# Patient Record
Sex: Male | Born: 1935 | Race: White | Hispanic: No | Smoking: Never smoker
Health system: Southern US, Community
[De-identification: ages and names within clinical notes are randomized; demographics above are authoritative.]

## PROBLEM LIST (undated history)

## (undated) DIAGNOSIS — I10 Essential (primary) hypertension: Secondary | ICD-10-CM

## (undated) DIAGNOSIS — H409 Unspecified glaucoma: Secondary | ICD-10-CM

## (undated) DIAGNOSIS — F419 Anxiety disorder, unspecified: Secondary | ICD-10-CM

## (undated) DIAGNOSIS — R32 Unspecified urinary incontinence: Secondary | ICD-10-CM

## (undated) HISTORY — PX: TONSILLECTOMY: SUR1361

## (undated) HISTORY — DX: Anxiety disorder, unspecified: F41.9

## (undated) HISTORY — DX: Unspecified glaucoma: H40.9

## (undated) HISTORY — DX: Unspecified urinary incontinence: R32

## (undated) HISTORY — PX: HERNIA REPAIR: SHX51

---

## 2011-04-23 ENCOUNTER — Emergency Department: Payer: Self-pay | Admitting: Emergency Medicine

## 2011-11-28 ENCOUNTER — Emergency Department: Payer: Self-pay | Admitting: Emergency Medicine

## 2014-01-17 DIAGNOSIS — K2100 Gastro-esophageal reflux disease with esophagitis, without bleeding: Secondary | ICD-10-CM | POA: Insufficient documentation

## 2014-01-17 DIAGNOSIS — N289 Disorder of kidney and ureter, unspecified: Secondary | ICD-10-CM | POA: Insufficient documentation

## 2014-01-17 DIAGNOSIS — I10 Essential (primary) hypertension: Secondary | ICD-10-CM | POA: Insufficient documentation

## 2014-01-17 DIAGNOSIS — Z8672 Personal history of thrombophlebitis: Secondary | ICD-10-CM | POA: Insufficient documentation

## 2014-01-17 DIAGNOSIS — I129 Hypertensive chronic kidney disease with stage 1 through stage 4 chronic kidney disease, or unspecified chronic kidney disease: Secondary | ICD-10-CM | POA: Insufficient documentation

## 2014-01-17 DIAGNOSIS — H409 Unspecified glaucoma: Secondary | ICD-10-CM | POA: Insufficient documentation

## 2014-01-17 DIAGNOSIS — N183 Chronic kidney disease, stage 3 unspecified: Secondary | ICD-10-CM | POA: Insufficient documentation

## 2017-06-29 ENCOUNTER — Ambulatory Visit
Admission: RE | Admit: 2017-06-29 | Discharge: 2017-06-29 | Disposition: A | Discharge status: Home / Self Care | Payer: Medicare Other | Source: Ambulatory Visit | Attending: Family | Admitting: Family

## 2017-06-29 ENCOUNTER — Other Ambulatory Visit: Payer: Self-pay | Admitting: Family

## 2017-06-29 DIAGNOSIS — R05 Cough: Secondary | ICD-10-CM

## 2017-06-29 DIAGNOSIS — K449 Diaphragmatic hernia without obstruction or gangrene: Secondary | ICD-10-CM | POA: Insufficient documentation

## 2017-06-29 DIAGNOSIS — R059 Cough, unspecified: Secondary | ICD-10-CM

## 2017-06-29 DIAGNOSIS — R5383 Other fatigue: Secondary | ICD-10-CM

## 2017-07-07 ENCOUNTER — Encounter: Payer: Self-pay | Admitting: Emergency Medicine

## 2017-07-07 ENCOUNTER — Emergency Department
Admission: EM | Admit: 2017-07-07 | Discharge: 2017-07-07 | Disposition: A | Discharge status: Home / Self Care | Payer: Medicare Other | Attending: Emergency Medicine | Admitting: Emergency Medicine

## 2017-07-07 ENCOUNTER — Emergency Department: Payer: Medicare Other

## 2017-07-07 ENCOUNTER — Other Ambulatory Visit: Payer: Self-pay

## 2017-07-07 DIAGNOSIS — I10 Essential (primary) hypertension: Secondary | ICD-10-CM | POA: Insufficient documentation

## 2017-07-07 DIAGNOSIS — R1030 Lower abdominal pain, unspecified: Secondary | ICD-10-CM | POA: Insufficient documentation

## 2017-07-07 DIAGNOSIS — R634 Abnormal weight loss: Secondary | ICD-10-CM | POA: Insufficient documentation

## 2017-07-07 DIAGNOSIS — R109 Unspecified abdominal pain: Secondary | ICD-10-CM

## 2017-07-07 HISTORY — DX: Essential (primary) hypertension: I10

## 2017-07-07 LAB — COMPREHENSIVE METABOLIC PANEL
ALK PHOS: 59 U/L (ref 38–126)
ALT: 14 U/L — AB (ref 17–63)
AST: 24 U/L (ref 15–41)
Albumin: 4.4 g/dL (ref 3.5–5.0)
Anion gap: 10 (ref 5–15)
BUN: 12 mg/dL (ref 6–20)
CALCIUM: 9.6 mg/dL (ref 8.9–10.3)
CO2: 28 mmol/L (ref 22–32)
CREATININE: 0.99 mg/dL (ref 0.61–1.24)
Chloride: 100 mmol/L — ABNORMAL LOW (ref 101–111)
Glucose, Bld: 107 mg/dL — ABNORMAL HIGH (ref 65–99)
Potassium: 3.7 mmol/L (ref 3.5–5.1)
Sodium: 138 mmol/L (ref 135–145)
Total Bilirubin: 0.7 mg/dL (ref 0.3–1.2)
Total Protein: 7.8 g/dL (ref 6.5–8.1)

## 2017-07-07 LAB — URINALYSIS, COMPLETE (UACMP) WITH MICROSCOPIC
BILIRUBIN URINE: NEGATIVE
Bacteria, UA: NONE SEEN
GLUCOSE, UA: NEGATIVE mg/dL
HGB URINE DIPSTICK: NEGATIVE
Ketones, ur: NEGATIVE mg/dL
LEUKOCYTES UA: NEGATIVE
Nitrite: NEGATIVE
Protein, ur: NEGATIVE mg/dL
Specific Gravity, Urine: 1.006 (ref 1.005–1.030)
pH: 6 (ref 5.0–8.0)

## 2017-07-07 LAB — CBC
HCT: 43.4 % (ref 40.0–52.0)
Hemoglobin: 14.7 g/dL (ref 13.0–18.0)
MCH: 28.6 pg (ref 26.0–34.0)
MCHC: 33.9 g/dL (ref 32.0–36.0)
MCV: 84.3 fL (ref 80.0–100.0)
PLATELETS: 116 10*3/uL — AB (ref 150–440)
RBC: 5.15 MIL/uL (ref 4.40–5.90)
RDW: 13.5 % (ref 11.5–14.5)
WBC: 7.6 10*3/uL (ref 3.8–10.6)

## 2017-07-07 LAB — LIPASE, BLOOD: Lipase: 30 U/L (ref 11–51)

## 2017-07-07 NOTE — ED Triage Notes (Signed)
Says has been having stomach pain for 6 months.  Unable to eat.  Is able to drink water okay.  Has been to pcp.  Is awaiting referral to gi doctor for "gastritis"

## 2017-07-07 NOTE — ED Notes (Signed)
Patient taken to imaging. 

## 2017-07-07 NOTE — Discharge Instructions (Signed)
please return here for worse pain fever vomiting. Please give Dr. [facility] office a call to set up a follow-up appointment.

## 2017-07-07 NOTE — ED Notes (Signed)
Patient declined discharge recheck of vital signs.

## 2017-07-07 NOTE — ED Provider Notes (Signed)
[facility] Department Provider Note   ____________________________________________   First MD Initiated Contact with Patient 07/07/17 1604     (approximate)  I have reviewed the triage vital signs and the nursing notes.   HISTORY  Chief Complaint Abdominal Pain  HPI Scott Ashley is a 84 y.o. male Who had diarrhea for month last month. This is now finished. Now he is complaining of a sense of fullness when he eats a little bit. He is not able to eat more or I should say as much as usual and is lost 10 pounds in the last month. He also has occasional mild lower abdominal pain mostly on the left side.he is no longer having diarrhea is not having any black stools or red blood in the stools is not vomiting.  Past Medical History:  Diagnosis Date  . Hypertension     There are no active problems to display for this patient.   Past Surgical History:  Procedure Laterality Date  . HERNIA REPAIR      Prior to Admission medications   Not on File    Allergies Codeine  History reviewed. No pertinent family history.  Social History Social History   Tobacco Use  . Smoking status: Never Smoker  . Smokeless tobacco: Never Used  Substance Use Topics  . Alcohol use: Never    Frequency: Never  . Drug use: Not on file    Review of Systems  Constitutional: No fever/chills Eyes: No visual changes. ENT: No sore throat. Cardiovascular: Denies chest pain. Respiratory: Denies shortness of breath. Gastrointestinal: see history of present illness Genitourinary: Negative for dysuria. Musculoskeletal: Negative for back pain. Skin: Negative for rash. Neurological: Negative for headaches, focal weakness  ____________________________________________   PHYSICAL EXAM:  VITAL SIGNS: ED Triage Vitals  Enc Vitals Group     BP 07/07/17 1317 (!) 149/102     Pulse Rate 07/07/17 1317 93     Resp 07/07/17 1317 18     Temp 07/07/17 1317 98.3 F  (36.8 C)     Temp Source 07/07/17 1317 Oral     SpO2 07/07/17 1317 95 %     Weight 07/07/17 1318 181 lb (82.1 kg)     Height 07/07/17 1318 6' (1.829 m)     Head Circumference --      Peak Flow --      Pain Score 07/07/17 1317 3     Pain Loc --      Pain Edu? --      Excl. in [place]? --     Constitutional: Alert and oriented. Well appearing and in no acute distress. Eyes: Conjunctivae are normal.  Head: Atraumatic. Nose: No congestion/rhinnorhea. Mouth/Throat: Mucous membranes are moist.  Oropharynx non-erythematous. Neck: No stridor.   Cardiovascular: Normal rate, regular rhythm. Grossly normal heart sounds.  Good peripheral circulation. Respiratory: Normal respiratory effort.  No retractions. Lungs CTAB. Gastrointestinal: Soft and nontender. No distention. No abdominal bruits. No CVA tenderness. }Musculoskeletal: No lower extremity tenderness nor edema.   Neurologic:  Normal speech and language. No gross focal neurologic deficits are appreciated. No gait instability. Skin:  Skin is warm, dry and intact. No rash noted. Psychiatric: Mood and affect are normal. Speech and behavior are normal.  ____________________________________________   LABS (all labs ordered are listed, but only abnormal results are displayed)  Labs Reviewed  COMPREHENSIVE METABOLIC PANEL - Abnormal; Notable for the following components:      Result Value   Chloride 100 (*)  Glucose, Bld 107 (*)    ALT 14 (*)    All other components within normal limits  CBC - Abnormal; Notable for the following components:   Platelets 116 (*)    All other components within normal limits  URINALYSIS, COMPLETE (UACMP) WITH MICROSCOPIC - Abnormal; Notable for the following components:   Color, Urine YELLOW (*)    APPearance CLEAR (*)    All other components within normal limits  LIPASE, BLOOD   ____________________________________________  EKG  EKG read and interpreted by me shows A. fib at a rate of 89 0 axis no  acute ST-T wave changes ____________________________________________  RADIOLOGY  ED MD interpretation:   Official radiology report(s): Dg Abdomen Acute W/chest  Result Date: 07/07/2017 CLINICAL DATA:  Abdominal pain for the past 6 months. Previous hernia repair. EXAM: DG ABDOMEN ACUTE W/ 1V CHEST COMPARISON:  Chest dated 06/29/2017. FINDINGS: Normal sized heart. Tortuous aorta. Moderately large hiatal hernia. Stable prominent left epicardial fat pad and right nipple shadow. Clear lungs. The previously seen small right upper lobe nodular density is no longer demonstrated. Normal bowel gas pattern without free peritoneal air. Diffuse osteopenia. Thoracic and lumbar spine degenerative changes and mild scoliosis. Old, healed proximal left humerus fracture. IMPRESSION: 1. No acute abnormality. 2. Stable moderate-sized hiatal hernia. 3. The previously seen small right upper lobe nodular density there is no longer visualized. Electronically Signed   By: Claudie Revering M.D.   On: 07/07/2017 16:44    ____________________________________________   PROCEDURES  Procedure(s) performed:   Procedures  Critical Care performed:   ____________________________________________   INITIAL IMPRESSION / ASSESSMENT AND PLAN / ED COURSE  patient's labs and x-rays are within normal limits. Patientohl which I have provided. Patient is to return here if further problems.         ____________________________________________   FINAL CLINICAL IMPRESSION(S) / ED DIAGNOSES  Final diagnoses:  Abdominal pain, unspecified abdominal location     ED Discharge Orders    None       Note:  This document was prepared using Dragon voice recognition software and may include unintentional dictation errors.    Nena Polio, MD 07/07/17 [phone]

## 2017-07-12 ENCOUNTER — Ambulatory Visit: Payer: Medicare Other | Admitting: Gastroenterology

## 2017-07-12 ENCOUNTER — Encounter: Payer: Self-pay | Admitting: Gastroenterology

## 2017-07-12 VITALS — BP 148/73 | HR 103 | Temp 98.2°F | Ht 72.0 in | Wt 177.8 lb

## 2017-07-12 DIAGNOSIS — R1030 Lower abdominal pain, unspecified: Secondary | ICD-10-CM

## 2017-07-12 DIAGNOSIS — R197 Diarrhea, unspecified: Secondary | ICD-10-CM

## 2017-07-12 NOTE — Progress Notes (Signed)
Scott Ashley [address]  [place]  [place], [place] [address]  Main: [phone]  Fax: [phone]   Gastroenterology Consultation  Referring Provider:     Laneta Simmers, NP Primary Care Physician:  Scott Simmers, NP Primary Gastroenterologist:  Dr. Vonda Ashley Reason for Consultation:     Abdominal pain, diarrhea        HPI:    Chief Complaint  Patient presents with  . Establish Care    Scott Ashley is a 84 y.o. y/o male referred for consultation & management  by Dr. Laneta Simmers, NP.  Patient here with his granddaughter today.  They report the patient has been having 27-month history of abdominal pain and diarrhea.  He reports abdominal pain to be diffuse, dull, constant, 3/10, with no radiation, no nausea or vomiting.  Eating makes the pain immediately worse, and he reports some loss of appetite and subjective weight loss due to this.  No dysphagia, no heartburn.  He has started using Zantac twice a day which has helped his pain somewhat but is still interfering with his eating.  Reports 1 levels bowel movement daily.  No blood in stool.  No previous history of similar symptoms.   Went to the ER on May 31 for the pain, and abdominal x-ray showed a hiatal hernia, lipase were normal at the time.  White count was normal as well.  Patient denies any fever or chills.  They also report taking antibiotics recently for sinus infection, states last antibiotic use was 3 to 4 days ago.  However, states symptoms were present prior to the antibiotics.  Colonoscopy report available in provation from 2003.  This reported, 5 mm cecal polyp that was removed via hot forceps.  Diverticulosis.  Pathology report not available.  EGD at the time reported gastric antral erythema, hiatal hernia.  Biopsies of the stomach were done but pathology report not available.  Past Medical History:  Diagnosis Date  . Hypertension     Past Surgical History:  Procedure  Laterality Date  . HERNIA REPAIR      Prior to Admission medications   Not on File    No family history on file.   Social History   Tobacco Use  . Smoking status: Never Smoker  . Smokeless tobacco: Never Used  Substance Use Topics  . Alcohol use: Never    Frequency: Never  . Drug use: Not on file    Allergies as of 07/12/2017 - Review Complete 07/07/2017  Allergen Reaction Noted  . Codeine Other (See Comments) 06/10/2016    Review of Systems:    All systems reviewed and negative except where noted in HPI.   Physical Exam:  Vitals reviewed No LMP for male patient. Psych:  Alert and cooperative. Normal mood and affect. General:   Alert,  Well-developed, well-nourished, pleasant and cooperative in NAD Head:  Normocephalic and atraumatic. Eyes:  Sclera clear, no icterus.   Conjunctiva pink. Ears:  Normal auditory acuity. Nose:  No deformity, discharge, or lesions. Mouth:  No deformity or lesions,oropharynx pink & moist. Neck:  Supple; no masses or thyromegaly. Lungs:  Respirations even and unlabored.  Clear throughout to auscultation.   No wheezes, crackles, or rhonchi. No acute distress. Heart:  Regular rate and rhythm; no murmurs, clicks, rubs, or gallops. Abdomen:  Normal bowel sounds.  No bruits.  Soft, non-tender and non-distended without masses, hepatosplenomegaly or hernias noted.  No guarding or rebound tenderness.    Msk:  Symmetrical without  gross deformities. Good, equal movement & strength bilaterally. Pulses:  Normal pulses noted. Extremities:  No clubbing or edema.  No cyanosis. Neurologic:  Alert and oriented x3;  grossly normal neurologically. Skin:  Intact without significant lesions or rashes. No jaundice. Lymph Nodes:  No significant cervical adenopathy. Psych:  Alert and cooperative. Normal mood and affect.   Labs: CBC    Component Value Date/Time   WBC 7.6 07/07/2017 1330   RBC 5.15 07/07/2017 1330   HGB 14.7 07/07/2017 1330   HCT 43.4  07/07/2017 1330   PLT 116 (L) 07/07/2017 1330   MCV 84.3 07/07/2017 1330   MCH 28.6 07/07/2017 1330   MCHC 33.9 07/07/2017 1330   RDW 13.5 07/07/2017 1330   CMP     Component Value Date/Time   NA 138 07/07/2017 1330   K 3.7 07/07/2017 1330   CL 100 (L) 07/07/2017 1330   CO2 28 07/07/2017 1330   GLUCOSE 107 (H) 07/07/2017 1330   BUN 12 07/07/2017 1330   CREATININE 0.99 07/07/2017 1330   CALCIUM 9.6 07/07/2017 1330   PROT 7.8 07/07/2017 1330   ALBUMIN 4.4 07/07/2017 1330   AST 24 07/07/2017 1330   ALT 14 (L) 07/07/2017 1330   ALKPHOS 59 07/07/2017 1330   BILITOT 0.7 07/07/2017 1330   GFRNONAA >60 07/07/2017 1330   GFRAA >60 07/07/2017 1330    Imaging Studies: Dg Chest 2 View  Result Date: 06/29/2017 CLINICAL DATA:  Cough, fatigue for a month, on medication for hypertension EXAM: CHEST - 2 VIEW COMPARISON:  None. FINDINGS: No active infiltrate or effusion is seen. A small nodular opacity in the right upper lung field may represent a faintly calcified granuloma but comparison with prior or follow-up chest x-ray is recommended. A small nodule at the right base most likely represents nipple shadow. Mediastinal and hilar contours are unremarkable. The heart is mildly enlarged and a small to moderate size hiatal hernia is present. No bony abnormality is seen. IMPRESSION: 1. No pneumonia.  No pleural effusion. 2. Small to moderate size hiatal hernia. 3. Question granuloma in the right upper lobe. Recommend attention to this area on follow-up chest x-ray. . Electronically Signed   By: Ivar Drape M.D.   On: 06/29/2017 11:57   Dg Abdomen Acute W/chest  Result Date: 07/07/2017 CLINICAL DATA:  Abdominal pain for the past 6 months. Previous hernia repair. EXAM: DG ABDOMEN ACUTE W/ 1V CHEST COMPARISON:  Chest dated 06/29/2017. FINDINGS: Normal sized heart. Tortuous aorta. Moderately large hiatal hernia. Stable prominent left epicardial fat pad and right nipple shadow. Clear lungs. The previously  seen small right upper lobe nodular density is no longer demonstrated. Normal bowel gas pattern without free peritoneal air. Diffuse osteopenia. Thoracic and lumbar spine degenerative changes and mild scoliosis. Old, healed proximal left humerus fracture. IMPRESSION: 1. No acute abnormality. 2. Stable moderate-sized hiatal hernia. 3. The previously seen small right upper lobe nodular density there is no longer visualized. Electronically Signed   By: Claudie Revering M.D.   On: 07/07/2017 16:44    Assessment and Plan:   WYLEE DORANTES is a 84 y.o. y/o male has been referred for abdominal pain, diarrhea, recent antibiotics use, with normal white count and lipase on recent ER visit  We will check stool for C. difficile, GI panel to rule out infectious causes given diarrhea and abdominal pain and recent antibiotics and use  Also check stool for H. Pylori  If This is negative, patient will need CT abdomen given  that his pain is interfering with his appetite.   If above stool testing is negative, will also change Zantac to PPI to see if it helps symptoms  No acute abdomen on examination today.  No alarm symptoms present at this time  Patient asked to hydrate well, and maintain good nutrition.  I have asked him to try a full liquid diet, to see if they are able to tolerate that better.  We will follow in clinic closely.  I have discussed alarm symptoms with patient and granddaughter extensively (including but not limited to blood in stool, worsening abdominal pain, fever or chills, altered bowel habits, melena, dysphagia, nausea or vomiting, or any other reason for concern).  If these occur they are to call us immediately or go to the ER.  They verbalized understanding of this.   Dr Scott Ashley

## 2017-07-12 NOTE — Patient Instructions (Signed)
F/U 1 month Full Liquid Diet A full liquid diet may be used:  To help you transition from a clear liquid diet to a soft diet.  When your body is healing and can only tolerate foods that are easy to digest.  Before or after certain a procedure, test, or surgery (such as stomach or intestinal surgeries).  If you have trouble swallowing or chewing.  A full liquid diet includes fluids and foods that are liquid or will become liquid at room temperature. The full liquid diet gives you the proteins, fluids, salts, and minerals that you need for energy. If you continue this diet for more than 72 hours, talk to your health care provider about how many calories you need to consume. If you continue the diet for more than 5 days, talk to your health care provider about taking a multivitamin or a nutritional supplement. What do I need to know about a full liquid diet?  You may have any liquid.  You may have any food that becomes a liquid at room temperature. The food is considered a liquid if it can be poured off a spoon at room temperature.  Drink one serving of citrus or vitamin C-enriched fruit juice daily. What foods can I eat? Grains Any grain food that can be pureed in soup (such as crackers, pasta, and rice). Hot cereal (such as farina or oatmeal) that has been blended. Talk to your health care provider or dietitian about these foods. Vegetables Pulp-free tomato or vegetable juice. Vegetables pureed in soup. Fruits Fruit juice, including nectars and juices with pulp. Meats and Other Protein Sources Eggs in custard, eggnog mix, and eggs used in ice cream or pudding. Strained meats, like in baby food, may be allowed. Consult your health care provider. Dairy Milk and milk-based beverages, including milk shakes and instant breakfast mixes. Smooth yogurt. Pureed cottage cheese. Avoid these foods if they are not well tolerated. Beverages All beverages, including liquid nutritional supplements.  Ask your health care provider if you can have carbonated beverages. They may not be well tolerated. Condiments Iodized salt, pepper, spices, and flavorings. Cocoa powder. Vinegar, ketchup, yellow mustard, smooth sauces (such as hollandaise, cheese sauce, or white sauce), and soy sauce. Sweets and Desserts Custard, smooth pudding. Flavored gelatin. Tapioca, junket. Plain ice cream, sherbet, fruit ices. Frozen ice pops, frozen fudge pops, pudding pops, and other frozen bars with cream. Syrups, including chocolate syrup. Sugar, honey, jelly. Fats and Oils Margarine, butter, cream, sour cream, and oils. Other Broth and cream soups. Strained, broth-based soups. The items listed above may not be a complete list of recommended foods or beverages. Contact your dietitian for more options. What foods can I not eat? Grains All breads. Grains are not allowed unless they are pureed into soup. Vegetables Vegetables are not allowed unless they are juiced, or cooked and pureed into soup. Fruits Fruits are not allowed unless they are juiced. Meats and Other Protein Sources Any meat or fish. Cooked or raw eggs. Nut butters. Dairy Cheese. Condiments Stone ground mustards. Fats and Oils Fats that are coarse or chunky. Sweets and Desserts Ice cream or other frozen desserts that have any solids in them or on top, such as nuts, chocolate chips, and pieces of cookies. Cakes. Cookies. Candy. Others Soups with chunks or pieces in them. The items listed above may not be a complete list of foods and beverages to avoid. Contact your dietitian for more information. This information is not intended to replace advice given to  you by your health care provider. Make sure you discuss any questions you have with your health care provider. Document Released: 01/24/2005 Document Revised: 07/02/2015 Document Reviewed: 11/29/2012 Elsevier Interactive Patient Education  2017 [employer]  Diarrhea, Adult Diarrhea is  when you have loose and water poop (stool) often. Diarrhea can make you feel weak and cause you to get dehydrated. Dehydration can make you tired and thirsty, make you have a dry mouth, and make it so you pee (urinate) less often. Diarrhea often lasts 2-3 days. However, it can last longer if it is a sign of something more serious. It is important to treat your diarrhea as told by your doctor. Follow these instructions at home: Eating and drinking  Follow these recommendations as told by your doctor:  Take an oral rehydration solution (ORS). This is a drink that is sold at pharmacies and stores.  Drink clear fluids, such as: ? Water. ? Ice chips. ? Diluted fruit juice. ? Low-calorie sports drinks.  Eat bland, easy-to-digest foods in small amounts as you are able. These foods include: ? Bananas. ? Applesauce. ? Rice. ? Low-fat (lean) meats. ? Toast. ? Crackers.  Avoid drinking fluids that have a lot of sugar or caffeine in them.  Avoid alcohol.  Avoid spicy or fatty foods.  General instructions   Drink enough fluid to keep your pee (urine) clear or pale yellow.  Wash your hands often. If you cannot use soap and water, use hand sanitizer.  Make sure that all people in your home wash their hands well and often.  Take over-the-counter and prescription medicines only as told by your doctor.  Rest at home while you get better.  Watch your condition for any changes.  Take a warm bath to help with any burning or pain from having diarrhea.  Keep all follow-up visits as told by your doctor. This is important. Contact a doctor if:  You have a fever.  Your diarrhea gets worse.  You have new symptoms.  You cannot keep fluids down.  You feel light-headed or dizzy.  You have a headache.  You have muscle cramps. Get help right away if:  You have chest pain.  You feel very weak or you pass out (faint).  You have bloody or black poop or poop that look like tar.  You  have very bad pain, cramping, or bloating in your belly (abdomen).  You have trouble breathing or you are breathing very quickly.  Your heart is beating very quickly.  Your skin feels cold and clammy.  You feel confused.  You have signs of dehydration, such as: ? Dark pee, hardly any pee, or no pee. ? Cracked lips. ? Dry mouth. ? Sunken eyes. ? Sleepiness. ? Weakness. This information is not intended to replace advice given to you by your health care provider. Make sure you discuss any questions you have with your health care provider. Document Released: 07/13/2007 Document Revised: 08/14/2015 Document Reviewed: 09/30/2014 Elsevier Interactive Patient Education  2018 [employer].

## 2017-07-15 LAB — H. PYLORI ANTIGEN, STOOL: H pylori Ag, Stl: POSITIVE — AB

## 2017-07-15 LAB — GI PROFILE, STOOL, PCR
ASTROVIRUS: NOT DETECTED
Adenovirus F 40/41: NOT DETECTED
C difficile toxin A/B: NOT DETECTED
CAMPYLOBACTER: NOT DETECTED
CRYPTOSPORIDIUM: NOT DETECTED
CYCLOSPORA CAYETANENSIS: NOT DETECTED
ENTAMOEBA HISTOLYTICA: NOT DETECTED
ENTEROAGGREGATIVE E COLI: NOT DETECTED
ENTEROPATHOGENIC E COLI: NOT DETECTED
Enterotoxigenic E coli: NOT DETECTED
Giardia lamblia: NOT DETECTED
NOROVIRUS GI/GII: NOT DETECTED
Plesiomonas shigelloides: NOT DETECTED
Rotavirus A: NOT DETECTED
SHIGELLA/ENTEROINVASIVE E COLI: NOT DETECTED
Salmonella: NOT DETECTED
Sapovirus: NOT DETECTED
Shiga-toxin-producing E coli: NOT DETECTED
VIBRIO: NOT DETECTED
Vibrio cholerae: NOT DETECTED
YERSINIA ENTEROCOLITICA: NOT DETECTED

## 2017-07-18 ENCOUNTER — Other Ambulatory Visit: Payer: Self-pay

## 2017-07-18 NOTE — Addendum Note (Signed)
Addended by: [name] on: 07/18/2017 01:04 PM   Modules accepted: Orders

## 2017-07-19 ENCOUNTER — Other Ambulatory Visit: Payer: Self-pay | Admitting: Gastroenterology

## 2017-07-19 ENCOUNTER — Telehealth: Payer: Self-pay | Admitting: Gastroenterology

## 2017-07-19 MED ORDER — AMOXICILLIN 500 MG PO TABS
1000.0000 mg | ORAL_TABLET | Freq: Two times a day (BID) | ORAL | 0 refills | Status: AC
Start: 2017-07-19 — End: 2017-08-02

## 2017-07-19 MED ORDER — OMEPRAZOLE 20 MG PO CPDR: 20.0000 mg | DELAYED_RELEASE_CAPSULE | Freq: Two times a day (BID) | ORAL | 0 refills | Status: DC

## 2017-07-19 MED ORDER — CLARITHROMYCIN 250 MG PO TABS: 500.0000 mg | ORAL_TABLET | Freq: Two times a day (BID) | ORAL | 0 refills | Status: AC

## 2017-07-19 NOTE — Addendum Note (Signed)
Addended by: [name] on: 07/19/2017 02:17 PM   Modules accepted: Orders

## 2017-07-19 NOTE — Telephone Encounter (Signed)
-----   Message from Virgel Manifold, MD sent at 07/18/2017  1:05 PM EDT ----- Jackelyn Poling please let patient know, his stool testing was positive for Helicobacter pylori infection.  This can explain his abdominal pain.  I have prescribed 2 antibiotics, and omeprazole for 14 days.  I have pended these orders.  Pharmacy is not listed on his chart, please ask him what pharmacy to send them to and release them.  If there is an interaction with any of his medications when you are ordering them, please let me know.

## 2017-07-19 NOTE — Telephone Encounter (Signed)
Patients granddaughter called and patient is ready to take his medication for H pylori but waiting for it to be called in. Please call

## 2017-07-19 NOTE — Telephone Encounter (Signed)
Dr. Bonna Gains sent in medication earlier and pt states he has got them.

## 2017-08-08 ENCOUNTER — Encounter: Payer: Self-pay | Admitting: Gastroenterology

## 2017-08-08 ENCOUNTER — Ambulatory Visit: Payer: Medicare Other | Admitting: Gastroenterology

## 2017-08-08 ENCOUNTER — Encounter (INDEPENDENT_AMBULATORY_CARE_PROVIDER_SITE_OTHER): Payer: Self-pay

## 2017-08-08 VITALS — BP 143/87 | HR 86 | Ht 72.0 in | Wt 178.4 lb

## 2017-08-08 DIAGNOSIS — R1030 Lower abdominal pain, unspecified: Secondary | ICD-10-CM

## 2017-08-08 DIAGNOSIS — K2970 Gastritis, unspecified, without bleeding: Secondary | ICD-10-CM

## 2017-08-08 DIAGNOSIS — K2990 Gastroduodenitis, unspecified, without bleeding: Secondary | ICD-10-CM | POA: Diagnosis not present

## 2017-08-08 MED ORDER — SIMETHICONE 125 MG PO CHEW: 125.0000 mg | CHEWABLE_TABLET | Freq: Four times a day (QID) | ORAL | 0 refills | Status: DC | PRN

## 2017-08-08 NOTE — Patient Instructions (Signed)
F/U 3 months Stop omeprazole. H.Pylori-do lab 3 weeks from this Wednesday. (after 7/17)

## 2017-08-08 NOTE — Progress Notes (Signed)
[name], MD [address]  [place]  [place], [place] [address]  Main: [phone]  Fax: [phone]   Primary Care Physician: Laneta Simmers, NP  Primary Gastroenterologist:  Dr. [name]  Chief Complaint  Patient presents with  . Follow-up    HPI: Scott Ashley is a 84 y.o. male here for follow-up of abdominal pain.  Patient was recently seen in clinic, and complained of abdominal pain.  Stool for H. pylori was positive, and he is status post triple therapy.  Since triple therapy, his abdominal pain is completely resolved.  He reports good appetite now, no nausea, vomiting, abdominal pain, altered bowel habits, blood in stool.  Reports intermittent gas at times.  No heartburn.  Previous history: Colonoscopy report available in provation from 2003.  This reported, 5 mm cecal polyp that was removed via hot forceps.  Diverticulosis.  Pathology report not available.  EGD at the time reported gastric antral erythema, hiatal hernia.  Biopsies of the stomach were done but pathology report not available.   Current Outpatient Medications  Medication Sig Dispense Refill  . bisoprolol-hydrochlorothiazide (ZIAC) 5-6.25 MG tablet TK 1 T PO QD FOR BLOOD PRESSURE  5  . cetirizine (ZYRTEC) 5 MG tablet TK 1 T PO  QD  1  . fluticasone (FLONASE) 50 MCG/ACT nasal spray INSTILL 1 SPRAY IEN QD FOR ALLERGIES  2  . latanoprost (XALATAN) 0.005 % ophthalmic solution INT 1 GTT IN OU QHS  4  . omeprazole (PRILOSEC) 20 MG capsule TAKE 1 CAPSULE BY MOUTH TWICE DAILY BEFORE A MEAL FOR 14 DAYS 504 capsule 0  . PHENObarbital (LUMINAL) 30 MG tablet TK 1 T PO ONCE A DAY PRA  1  . tamsulosin (FLOMAX) 0.4 MG CAPS capsule TK 1 C PO QD 30 MINUTES AFTER THE SAME MEAL  5   No current facility-administered medications for this visit.     Allergies as of 08/08/2017 - Review Complete 08/08/2017  Allergen Reaction Noted  . Codeine Other (See Comments) 06/10/2016    ROS:  General:  Negative for anorexia, weight loss, fever, chills, fatigue, weakness. ENT: Negative for hoarseness, difficulty swallowing , nasal congestion. CV: Negative for chest pain, angina, palpitations, dyspnea on exertion, peripheral edema.  Respiratory: Negative for dyspnea at rest, dyspnea on exertion, cough, sputum, wheezing.  GI: See history of present illness. GU:  Negative for dysuria, hematuria, urinary incontinence, urinary frequency, nocturnal urination.  Endo: Negative for unusual weight change.    Physical Examination:   BP (!) 143/87   Pulse 86   Ht 6' (1.829 m)   Wt 178 lb 6.4 oz (80.9 kg)   BMI 24.20 kg/m    General: Well-nourished, well-developed in no acute distress.  Eyes: No icterus. Conjunctivae pink. Mouth: Oropharyngeal mucosa moist and pink , no lesions erythema or exudate. Neck: Supple, Trachea midline Abdomen: Bowel sounds are normal, nontender, nondistended, no hepatosplenomegaly or masses, no abdominal bruits or hernia , no rebound or guarding.   Extremities: No lower extremity edema. No clubbing or deformities. Neuro: Alert and oriented x 3.  Grossly intact. Skin: Warm and dry, no jaundice.   Psych: Alert and cooperative, normal mood and affect.   Labs: CMP     Component Value Date/Time   NA 138 07/07/2017 1330   K 3.7 07/07/2017 1330   CL 100 (L) 07/07/2017 1330   CO2 28 07/07/2017 1330   GLUCOSE 107 (H) 07/07/2017 1330   BUN 12 07/07/2017 1330   CREATININE 0.99 07/07/2017  1330   CALCIUM 9.6 07/07/2017 1330   PROT 7.8 07/07/2017 1330   ALBUMIN 4.4 07/07/2017 1330   AST 24 07/07/2017 1330   ALT 14 (L) 07/07/2017 1330   ALKPHOS 59 07/07/2017 1330   BILITOT 0.7 07/07/2017 1330   GFRNONAA >60 07/07/2017 1330   GFRAA >60 07/07/2017 1330   Lab Results  Component Value Date   WBC 7.6 07/07/2017   HGB 14.7 07/07/2017   HCT 43.4 07/07/2017   MCV 84.3 07/07/2017   PLT 116 (L) 07/07/2017    Imaging Studies: No results found.  Assessment and Plan:     Scott Ashley is a 84 y.o. y/o male here for follow-up of vomiting, which is completely resolved after triple therapy for positive stool H. pylori  Patient completed triple therapy 6 days ago Check for eradication 4 weeks after completion of triple therapy, which will be 3 weeks from tomorrow Stool test ordered, patient has to have it done in 3 weeks Stop omeprazole today as well No further testing needed for abdominal pain since it is completely resolved  Patient reports intermittent discomfort with gas.  Will try trial of simethicone  Last colonoscopy was in 2003, and 5 mm cecal polyp was removed.  Pathology report not available anywhere. This was discussed with the patient, and he refuses any further colonoscopies  Dr [name]

## 2017-08-14 ENCOUNTER — Ambulatory Visit: Payer: Self-pay | Admitting: Gastroenterology

## 2017-08-17 ENCOUNTER — Telehealth: Payer: Self-pay | Admitting: Gastroenterology

## 2017-08-17 NOTE — Telephone Encounter (Signed)
Pt granddaughter is calling for Dr Bonna Gains he is having stomach discomfort and lost 2 LBS he is also having gas please [phone] Page

## 2017-08-21 NOTE — Telephone Encounter (Signed)
I spoke with pt and he states he is no longer hurting nor having diarrhea. He states he feels fine now. No CT scan ordered. Encouraged pt to contact office if symptoms reoccur.

## 2017-08-22 ENCOUNTER — Telehealth: Payer: Self-pay

## 2017-08-22 DIAGNOSIS — R1030 Lower abdominal pain, unspecified: Secondary | ICD-10-CM

## 2017-08-22 NOTE — Telephone Encounter (Signed)
Pt's daughter calls and states pt is having abdominal pain, no diarrhea but does have some nausea. She states this comes and goes. CT scan ordered and pt will call to schedule.

## 2017-08-23 ENCOUNTER — Other Ambulatory Visit: Payer: Self-pay

## 2017-08-23 DIAGNOSIS — Z01812 Encounter for preprocedural laboratory examination: Secondary | ICD-10-CM

## 2017-08-24 ENCOUNTER — Telehealth: Payer: Self-pay

## 2017-08-24 ENCOUNTER — Other Ambulatory Visit
Admission: RE | Admit: 2017-08-24 | Discharge: 2017-08-24 | Disposition: A | Discharge status: Home / Self Care | Payer: Medicare Other | Source: Ambulatory Visit | Attending: Gastroenterology | Admitting: Gastroenterology

## 2017-08-24 ENCOUNTER — Ambulatory Visit
Admission: RE | Admit: 2017-08-24 | Discharge: 2017-08-24 | Disposition: A | Discharge status: Home / Self Care | Payer: Medicare Other | Source: Ambulatory Visit | Attending: Gastroenterology | Admitting: Gastroenterology

## 2017-08-24 DIAGNOSIS — Z01812 Encounter for preprocedural laboratory examination: Secondary | ICD-10-CM

## 2017-08-24 DIAGNOSIS — K449 Diaphragmatic hernia without obstruction or gangrene: Secondary | ICD-10-CM | POA: Insufficient documentation

## 2017-08-24 DIAGNOSIS — N400 Enlarged prostate without lower urinary tract symptoms: Secondary | ICD-10-CM | POA: Insufficient documentation

## 2017-08-24 DIAGNOSIS — R1030 Lower abdominal pain, unspecified: Secondary | ICD-10-CM | POA: Diagnosis not present

## 2017-08-24 LAB — CREATININE, SERUM
Creatinine, Ser: 1.16 mg/dL (ref 0.61–1.24)
GFR, EST NON AFRICAN AMERICAN: 55 mL/min — AB (ref >60)

## 2017-08-24 MED ORDER — IOPAMIDOL (ISOVUE-300) INJECTION 61%
100.0000 mL | Freq: Once | INTRAVENOUS | Status: AC | PRN
  Administered 2017-08-24: 100 mL via INTRAVENOUS

## 2017-08-24 NOTE — Telephone Encounter (Signed)
CT Abd Report obtained from [facility] at [facility] as follows:  "No explanation of abd pain. Incidental findings-  multiple exophytic lesions both kidneys, many structures compatible with cyst.  Large hiatal hernia. Renal Cell Carcinoma should not be excluded.  MRI with and without contrast recommended".   Thanks [employer]

## 2017-08-25 NOTE — Telephone Encounter (Signed)
Dr. Bonna Gains aware. See notes.

## 2017-08-29 ENCOUNTER — Ambulatory Visit: Payer: Self-pay

## 2017-09-04 LAB — SPECIMEN STATUS REPORT

## 2017-09-05 LAB — GI PROFILE, STOOL, PCR

## 2017-09-05 LAB — H. PYLORI ANTIGEN, STOOL: H PYLORI AG STL: NEGATIVE

## 2017-10-02 ENCOUNTER — Other Ambulatory Visit: Payer: Self-pay

## 2017-10-02 ENCOUNTER — Ambulatory Visit: Payer: Medicare Other | Admitting: Gastroenterology

## 2017-10-02 ENCOUNTER — Encounter (INDEPENDENT_AMBULATORY_CARE_PROVIDER_SITE_OTHER): Payer: Self-pay

## 2017-10-02 ENCOUNTER — Encounter: Payer: Self-pay | Admitting: Gastroenterology

## 2017-10-02 VITALS — BP 119/78 | HR 93 | Ht 72.0 in | Wt 170.4 lb

## 2017-10-02 DIAGNOSIS — R634 Abnormal weight loss: Secondary | ICD-10-CM

## 2017-10-02 DIAGNOSIS — R194 Change in bowel habit: Secondary | ICD-10-CM | POA: Diagnosis not present

## 2017-10-02 NOTE — Addendum Note (Signed)
Addended by: Earl Lagos on: 10/02/2017 05:17 PM   Modules accepted: Orders

## 2017-10-02 NOTE — Progress Notes (Signed)
[name], MD [address]  [place]  [place], [place] [address]  Main: [phone]  Fax: [phone]   Primary Care Physician: Laneta Simmers, NP  Primary Gastroenterologist:  Dr. [name]  Chief Complaint  Patient presents with  . Constipation    f/u abd pain. pt states he cannot go to bathroom unless he takes laxative.    HPI: HASON OFARRELL is a 84 y.o. male here for follow up of abdominal pain.  Patient initially seen in our clinic in June 2019 for abdominal pain, and subsequently diagnosed with positive stool H. pylori and underwent triple therapy.  Status post triple therapy his abdominal pain had completely resolved.  However, he called the office with complaints of abdominal pain in July 2019, and a CT abdomen was ordered which showed large hiatal hernia, no evidence of bowel obstruction, few scattered diverticula, moderate amount of stool in the distal colon.  And also incidentally found multiple exophytic lesions involving both kidneys, and this was forwarded to his primary care provider and he was asked to follow-up with them for further work-up.  He states he has an appointment this Friday with his primary care provider to discuss these results and consider an MRI.  Now he is complaining of altered bowel habits, with constipation for the last 2 to 3 weeks.  This is new for him, as he is used to having soft bowel movement once daily.  Now he does not go for a whole week at a time.  He tried taking over-the-counter Dulcolax.  He took 10 mg of this on one day with no results, and then tried a suppository with some results.  He also tried MiraLAX 1 capful for whole week which did not lead to any results.  He denies any abdominal pain.  However, reports loss of appetite and weight loss.  Documented weights show 10 pound weight loss in the last 4 months.  Current Outpatient Medications  Medication Sig Dispense Refill  . bisoprolol-hydrochlorothiazide  (ZIAC) 5-6.25 MG tablet TK 1 T PO QD FOR BLOOD PRESSURE  5  . latanoprost (XALATAN) 0.005 % ophthalmic solution INT 1 GTT IN OU QHS  4  . omeprazole (PRILOSEC) 20 MG capsule TAKE 1 CAPSULE BY MOUTH TWICE DAILY BEFORE A MEAL FOR 14 DAYS 504 capsule 0  . PHENObarbital (LUMINAL) 30 MG tablet TK 1 T PO ONCE A DAY PRA  1  . tamsulosin (FLOMAX) 0.4 MG CAPS capsule TK 1 C PO QD 30 MINUTES AFTER THE SAME MEAL  5  . cetirizine (ZYRTEC) 5 MG tablet TK 1 T PO  QD  1  . fluticasone (FLONASE) 50 MCG/ACT nasal spray INSTILL 1 SPRAY IEN QD FOR ALLERGIES  2  . simethicone (MYLICON) 366 MG chewable tablet Chew 1 tablet (125 mg total) by mouth every 6 (six) hours as needed for flatulence (Gas). 30 tablet 0   No current facility-administered medications for this visit.     Allergies as of 10/02/2017 - Review Complete 10/02/2017  Allergen Reaction Noted  . Codeine Other (See Comments) 06/10/2016    ROS:  General: Negative for anorexia, weight loss, fever, chills, fatigue, weakness. ENT: Negative for hoarseness, difficulty swallowing , nasal congestion. CV: Negative for chest pain, angina, palpitations, dyspnea on exertion, peripheral edema.  Respiratory: Negative for dyspnea at rest, dyspnea on exertion, cough, sputum, wheezing.  GI: See history of present illness. GU:  Negative for dysuria, hematuria, urinary incontinence, urinary frequency, nocturnal urination.  Endo: Negative for  unusual weight change.    Physical Examination:   BP 119/78   Pulse 93   Ht 6' (1.829 m)   Wt 170 lb 6.4 oz (77.3 kg)   BMI 23.11 kg/m   General: Well-nourished, well-developed in no acute distress.  Eyes: No icterus. Conjunctivae pink. Mouth: Oropharyngeal mucosa moist and pink , no lesions erythema or exudate. Neck: Supple, Trachea midline Abdomen: Bowel sounds are normal, nontender, nondistended, no hepatosplenomegaly or masses, no abdominal bruits or hernia , no rebound or guarding.   Extremities: No lower  extremity edema. No clubbing or deformities. Neuro: Alert and oriented x 3.  Grossly intact. Skin: Warm and dry, no jaundice.   Psych: Alert and cooperative, normal mood and affect.   Labs: CMP     Component Value Date/Time   NA 138 07/07/2017 1330   K 3.7 07/07/2017 1330   CL 100 (L) 07/07/2017 1330   CO2 28 07/07/2017 1330   GLUCOSE 107 (H) 07/07/2017 1330   BUN 12 07/07/2017 1330   CREATININE 1.16 08/24/2017 0848   CALCIUM 9.6 07/07/2017 1330   PROT 7.8 07/07/2017 1330   ALBUMIN 4.4 07/07/2017 1330   AST 24 07/07/2017 1330   ALT 14 (L) 07/07/2017 1330   ALKPHOS 59 07/07/2017 1330   BILITOT 0.7 07/07/2017 1330   GFRNONAA 55 (L) 08/24/2017 0848   GFRAA >60 08/24/2017 0848   Lab Results  Component Value Date   WBC 7.6 07/07/2017   HGB 14.7 07/07/2017   HCT 43.4 07/07/2017   MCV 84.3 07/07/2017   PLT 116 (L) 07/07/2017    Imaging Studies: No results found.  Assessment and Plan:   ALCARIO TINKEY is a 84 y.o. y/o male with altered bowel habits over the last few weeks, documented weight loss of 10 pounds in 4 months, loss of appetite here for follow-up  Due to altered bowel habits that are new for the patient, and colonoscopy indicated to rule out obstructive lesions.  In addition, patient has been taking MiraLAX daily for a whole week with no results, which is also concerning for this being more than just constipation.  Due to loss of appetite, and weight loss, we will also schedule EGD to rule out gastric malignancy as well  I have discussed alternative options, risks & benefits,  which include, but are not limited to, bleeding, infection, perforation,respiratory complication & drug reaction.  The patient agrees with this plan & written consent will be obtained.    I discussed his kidney lesions with him again, and he states he will follow-up with his primary care provider this Friday, to discuss MRI for this. I have encouraged him and his family present today to  follow-up in this regard closely with the primary care provider.  Patient would like the procedures done earliest possible, we will thus schedule with me ER or one of our other providers, next available.  Patient is agreeable to this.  He is passing gas, and does not have any signs of obstipation at this time  Dr [name]

## 2017-10-10 ENCOUNTER — Other Ambulatory Visit: Payer: Self-pay

## 2017-10-10 ENCOUNTER — Ambulatory Visit: Payer: Medicare Other | Admitting: Anesthesiology

## 2017-10-10 ENCOUNTER — Ambulatory Visit
Admission: RE | Admit: 2017-10-10 | Discharge: 2017-10-10 | Disposition: A | Discharge status: Home / Self Care | Payer: Medicare Other | Source: Ambulatory Visit | Attending: Gastroenterology | Admitting: Gastroenterology

## 2017-10-10 ENCOUNTER — Encounter: Payer: Self-pay | Admitting: Anesthesiology

## 2017-10-10 ENCOUNTER — Encounter
Admission: RE | Disposition: A | Discharge status: Home / Self Care | Payer: Self-pay | Source: Ambulatory Visit | Attending: Gastroenterology

## 2017-10-10 DIAGNOSIS — K2950 Unspecified chronic gastritis without bleeding: Secondary | ICD-10-CM | POA: Diagnosis not present

## 2017-10-10 DIAGNOSIS — D122 Benign neoplasm of ascending colon: Secondary | ICD-10-CM | POA: Diagnosis not present

## 2017-10-10 DIAGNOSIS — R634 Abnormal weight loss: Secondary | ICD-10-CM | POA: Diagnosis present

## 2017-10-10 DIAGNOSIS — K2900 Acute gastritis without bleeding: Secondary | ICD-10-CM

## 2017-10-10 DIAGNOSIS — R194 Change in bowel habit: Secondary | ICD-10-CM | POA: Diagnosis not present

## 2017-10-10 DIAGNOSIS — Z6822 Body mass index (BMI) 22.0-22.9, adult: Secondary | ICD-10-CM | POA: Insufficient documentation

## 2017-10-10 DIAGNOSIS — K222 Esophageal obstruction: Secondary | ICD-10-CM | POA: Insufficient documentation

## 2017-10-10 DIAGNOSIS — D123 Benign neoplasm of transverse colon: Secondary | ICD-10-CM | POA: Insufficient documentation

## 2017-10-10 DIAGNOSIS — K640 First degree hemorrhoids: Secondary | ICD-10-CM | POA: Diagnosis not present

## 2017-10-10 DIAGNOSIS — K449 Diaphragmatic hernia without obstruction or gangrene: Secondary | ICD-10-CM | POA: Insufficient documentation

## 2017-10-10 DIAGNOSIS — K5730 Diverticulosis of large intestine without perforation or abscess without bleeding: Secondary | ICD-10-CM | POA: Insufficient documentation

## 2017-10-10 DIAGNOSIS — I10 Essential (primary) hypertension: Secondary | ICD-10-CM | POA: Diagnosis not present

## 2017-10-10 DIAGNOSIS — K2980 Duodenitis without bleeding: Secondary | ICD-10-CM

## 2017-10-10 HISTORY — PX: COLONOSCOPY WITH PROPOFOL: SHX5780

## 2017-10-10 HISTORY — PX: ESOPHAGOGASTRODUODENOSCOPY (EGD) WITH PROPOFOL: SHX5813

## 2017-10-10 SURGERY — COLONOSCOPY WITH PROPOFOL
Anesthesia: General

## 2017-10-10 MED ORDER — PROPOFOL 10 MG/ML IV BOLUS
INTRAVENOUS | Status: DC | PRN
  Administered 2017-10-10: 60 mg via INTRAVENOUS

## 2017-10-10 MED ORDER — LIDOCAINE HCL (PF) 2 % IJ SOLN
INTRAMUSCULAR | Status: AC
  Filled 2017-10-10: qty 10

## 2017-10-10 MED ORDER — LIDOCAINE HCL (CARDIAC) PF 100 MG/5ML IV SOSY
PREFILLED_SYRINGE | INTRAVENOUS | Status: DC | PRN
  Administered 2017-10-10: 100 mg via INTRAVENOUS

## 2017-10-10 MED ORDER — PROPOFOL 500 MG/50ML IV EMUL
INTRAVENOUS | Status: AC
  Filled 2017-10-10: qty 50

## 2017-10-10 MED ORDER — SODIUM CHLORIDE 0.9 % IV SOLN
INTRAVENOUS | Status: DC
  Administered 2017-10-10: 09:00:00 via INTRAVENOUS

## 2017-10-10 MED ORDER — PHENYLEPHRINE HCL 10 MG/ML IJ SOLN
INTRAMUSCULAR | Status: DC | PRN
  Administered 2017-10-10 (×4): 100 ug via INTRAVENOUS

## 2017-10-10 MED ORDER — PROPOFOL 500 MG/50ML IV EMUL
INTRAVENOUS | Status: DC | PRN
  Administered 2017-10-10: 130 ug/kg/min via INTRAVENOUS

## 2017-10-10 NOTE — Anesthesia Procedure Notes (Signed)
Performed by: Brylan Dec, CRNA Pre-anesthesia Checklist: Patient identified, Emergency Drugs available, Suction available, Patient being monitored and Timeout performed Patient Re-evaluated:Patient Re-evaluated prior to induction Oxygen Delivery Method: Nasal cannula Induction Type: IV induction       

## 2017-10-10 NOTE — Anesthesia Post-op Follow-up Note (Signed)
Anesthesia QCDR form completed.        

## 2017-10-10 NOTE — Transfer of Care (Signed)
Immediate Anesthesia Transfer of Care Note  Patient: Scott Ashley  Procedure(s) Performed: COLONOSCOPY WITH PROPOFOL (N/A ) ESOPHAGOGASTRODUODENOSCOPY (EGD) WITH PROPOFOL (N/A )  Patient Location: PACU  Anesthesia Type:General  Level of Consciousness: sedated  Airway & Oxygen Therapy: Patient Spontanous Breathing and Patient connected to nasal cannula oxygen  Post-op Assessment: Report given to RN and Post -op Vital signs reviewed and stable  Post vital signs: Reviewed and stable  Last Vitals:  Vitals Value Taken Time  BP 96/61 10/10/2017  9:23 AM  Temp 36.3 C 10/10/2017  9:22 AM  Pulse 78 10/10/2017  9:23 AM  Resp 17 10/10/2017  9:23 AM  SpO2 99 % 10/10/2017  9:23 AM    Last Pain:  Vitals:   10/10/17 0922  TempSrc: Tympanic  PainSc:          Complications: No apparent anesthesia complications

## 2017-10-10 NOTE — Op Note (Signed)
[facility] Patient Name: Scott Ashley Procedure Date: 10/10/2017 8:51 AM MRN: [id number] Account #: [id number] Date of Birth: 08/09/1929 Admit Type: Outpatient Age: 84 Room: [facility] ENDO ROOM 4 Gender: Male Note Status: Finalized Procedure:            Upper GI endoscopy Indications:          Weight loss Providers:            Lucilla Lame MD, MD Referring MD:         Laneta Simmers Medicines:            Propofol per Anesthesia Complications:        No immediate complications. Procedure:            Pre-Anesthesia Assessment:                       - Prior to the procedure, a History and Physical was                        performed, and patient medications and allergies were                        reviewed. The patient's tolerance of previous                        anesthesia was also reviewed. The risks and benefits of                        the procedure and the sedation options and risks were                        discussed with the patient. All questions were                        answered, and informed consent was obtained. Prior                        Anticoagulants: The patient has taken no previous                        anticoagulant or antiplatelet agents. ASA Grade                        Assessment: II - A patient with mild systemic disease.                        After reviewing the risks and benefits, the patient was                        deemed in satisfactory condition to undergo the                        procedure.                       After obtaining informed consent, the endoscope was                        passed under direct vision. Throughout the procedure,  the patient's blood pressure, pulse, and oxygen                        saturations were monitored continuously. The Endoscope                        was introduced through the mouth, and advanced to the                        second part of  duodenum. The upper GI endoscopy was                        accomplished without difficulty. The patient tolerated                        the procedure well. Findings:      One benign-appearing, intrinsic moderate stenosis was found at the       gastroesophageal junction. The stenosis was traversed. A TTS dilator was       passed through the scope. Dilation with a 15-16.5-18 mm balloon dilator       was performed to 18 mm. The dilation site was examined following       endoscope reinsertion and showed complete resolution of luminal       narrowing.      A large hiatal hernia was present.      Localized moderate inflammation characterized by erosions was found in       the gastric antrum. Biopsies were taken with a cold forceps for       histology.      Localized mild inflammation characterized by erosions was found in the       duodenal bulb. Impression:           - Benign-appearing esophageal stenosis. Dilated.                       - Large hiatal hernia.                       - Gastritis. Biopsied.                       - Duodenitis. Recommendation:       - Discharge patient to home.                       - Resume previous diet.                       - Continue present medications.                       - Await pathology results. Procedure Code(s):    --- Professional ---                       [phone], Esophagogastroduodenoscopy, flexible, transoral;                        with transendoscopic balloon dilation of esophagus                        (less than 30 mm diameter)  81771, Esophagogastroduodenoscopy, flexible, transoral;                        with biopsy, single or multiple Diagnosis Code(s):    --- Professional ---                       R63.4, Abnormal weight loss                       K29.70, Gastritis, unspecified, without bleeding                       K29.80, Duodenitis without bleeding                       K22.2, Esophageal obstruction CPT  copyright 2017 American Medical Association. All rights reserved. The codes documented in this report are preliminary and upon coder review may  be revised to meet current compliance requirements. Lucilla Lame MD, MD 10/10/2017 9:06:50 AM This report has been signed electronically. Number of Addenda: 0 Note Initiated On: 10/10/2017 8:51 AM      [facility]

## 2017-10-10 NOTE — Anesthesia Postprocedure Evaluation (Signed)
Anesthesia Post Note  Patient: Scott Ashley  Procedure(s) Performed: COLONOSCOPY WITH PROPOFOL (N/A ) ESOPHAGOGASTRODUODENOSCOPY (EGD) WITH PROPOFOL (N/A )  Patient location during evaluation: Endoscopy Anesthesia Type: General Level of consciousness: awake and alert Pain management: pain level controlled Vital Signs Assessment: post-procedure vital signs reviewed and stable Respiratory status: spontaneous breathing, nonlabored ventilation, respiratory function stable and patient connected to nasal cannula oxygen Cardiovascular status: blood pressure returned to baseline and stable Postop Assessment: no apparent nausea or vomiting Anesthetic complications: no     Last Vitals:  Vitals:   10/10/17 0923 10/10/17 0932  BP: 96/61 (!) 72/54  Pulse: 78 69  Resp: 17 19  Temp:    SpO2: 99% 100%    Last Pain:  Vitals:   10/10/17 0952  TempSrc:   PainSc: 0-No pain                 Tyliah Schlereth S

## 2017-10-10 NOTE — H&P (Signed)
Lucilla Lame, MD [facility] [address], [place], [place] [address] Phone:[phone] Fax : [phone]  Primary Care Physician:  Laneta Simmers, NP Primary Gastroenterologist:  Dr. Allen Norris  Pre-Procedure History & Physical: HPI:  JOSHU FURUKAWA is a 84 y.o. male is here for an endoscopy and colonoscopy.   Past Medical History:  Diagnosis Date  . Hypertension     Past Surgical History:  Procedure Laterality Date  . HERNIA REPAIR     twice  . TONSILLECTOMY      Prior to Admission medications   Medication Sig Start Date End Date Taking? Authorizing Provider  bisoprolol-hydrochlorothiazide (ZIAC) 5-6.25 MG tablet TK 1 T PO QD FOR BLOOD PRESSURE 05/27/17  Yes [provider]  cetirizine (ZYRTEC) 5 MG tablet TK 1 T PO  QD 05/18/17  Yes [provider]  fluticasone (FLONASE) 50 MCG/ACT nasal spray INSTILL 1 SPRAY IEN QD FOR ALLERGIES 05/18/17  Yes [provider]  latanoprost (XALATAN) 0.005 % ophthalmic solution INT 1 GTT IN OU QHS 05/01/17  Yes [provider]  tamsulosin (FLOMAX) 0.4 MG CAPS capsule TK 1 C PO QD 30 MINUTES AFTER THE SAME MEAL 06/26/17  Yes [provider]  omeprazole (PRILOSEC) 20 MG capsule TAKE 1 CAPSULE BY MOUTH TWICE DAILY BEFORE A MEAL FOR 14 DAYS Patient not taking: Reported on 10/10/2017 07/19/17   Virgel Manifold, MD  PHENObarbital (LUMINAL) 30 MG tablet TK 1 T PO ONCE A DAY PRA 04/26/17   [provider]  simethicone (MYLICON) 465 MG chewable tablet Chew 1 tablet (125 mg total) by mouth every 6 (six) hours as needed for flatulence (Gas). 08/08/17 09/07/17  Virgel Manifold, MD    Allergies as of 10/03/2017 - Review Complete 10/02/2017  Allergen Reaction Noted  . Codeine Other (See Comments) 06/10/2016    History reviewed. No pertinent family history.  Social History   Socioeconomic History  . Marital status: Married    Spouse name: Not on file  . Number of children: Not on file  . Years  of education: Not on file  . Highest education level: Not on file  Occupational History  . Not on file  Social Needs  . Financial resource strain: Not on file  . Food insecurity:    Worry: Not on file    Inability: Not on file  . Transportation needs:    Medical: Not on file    Non-medical: Not on file  Tobacco Use  . Smoking status: Never Smoker  . Smokeless tobacco: Never Used  Substance and Sexual Activity  . Alcohol use: Never    Frequency: Never  . Drug use: Never  . Sexual activity: Not on file  Lifestyle  . Physical activity:    Days per week: Not on file    Minutes per session: Not on file  . Stress: Not on file  Relationships  . Social connections:    Talks on phone: Not on file    Gets together: Not on file    Attends religious service: Not on file    Active member of club or organization: Not on file    Attends meetings of clubs or organizations: Not on file    Relationship status: Not on file  . Intimate partner violence:    Fear of current or ex partner: Not on file    Emotionally abused: Not on file    Physically abused: Not on file    Forced sexual activity: Not on file  Other Topics Concern  . Not on file  Social History Narrative  . Not on file    Review of Systems: See HPI, otherwise negative ROS  Physical Exam: BP 104/86   Pulse 87   Temp (!) 96 F (35.6 C)   Ht 6' (1.829 m)   Wt 76.7 kg   SpO2 99%   BMI 22.92 kg/m  General:   Alert,  pleasant and cooperative in NAD Head:  Normocephalic and atraumatic. Neck:  Supple; no masses or thyromegaly. Lungs:  Clear throughout to auscultation.    Heart:  Regular rate and rhythm. Abdomen:  Soft, nontender and nondistended. Normal bowel sounds, without guarding, and without rebound.   Neurologic:  Alert and  oriented x4;  grossly normal neurologically.  Impression/Plan: [name] is here for an endoscopy and colonoscopy to be performed for weight loss and change in bowel habits  Risks,  benefits, limitations, and alternatives regarding  endoscopy and colonoscopy have been reviewed with the patient.  Questions have been answered.  All parties agreeable.   Lucilla Lame, MD  10/10/2017, 8:45 AM

## 2017-10-10 NOTE — Anesthesia Preprocedure Evaluation (Addendum)
Anesthesia Evaluation  Patient identified by MRN, date of birth, ID band Patient awake    Reviewed: Allergy & Precautions, NPO status , Patient's Chart, lab work & pertinent test results, reviewed documented beta blocker date and time   Airway Mallampati: II  TM Distance: >3 FB     Dental  (+) Upper Dentures, Lower Dentures   Pulmonary           Cardiovascular hypertension, Pt. on medications and Pt. on home beta blockers      Neuro/Psych    GI/Hepatic   Endo/Other    Renal/GU Renal disease     Musculoskeletal   Abdominal   Peds  Hematology   Anesthesia Other Findings   Reproductive/Obstetrics                            Anesthesia Physical Anesthesia Plan  ASA: III  Anesthesia Plan: General   Post-op Pain Management:    Induction: Intravenous  PONV Risk Score and Plan:   Airway Management Planned:   Additional Equipment:   Intra-op Plan:   Post-operative Plan:   Informed Consent: I have reviewed the patients History and Physical, chart, labs and discussed the procedure including the risks, benefits and alternatives for the proposed anesthesia with the patient or authorized representative who has indicated his/her understanding and acceptance.     Plan Discussed with: CRNA  Anesthesia Plan Comments:         Anesthesia Quick Evaluation

## 2017-10-10 NOTE — Op Note (Signed)
[facility] Gastroenterology Patient Name: Scott Ashley Procedure Date: 10/10/2017 8:46 AM MRN: [id number] Account #: [id number] Date of Birth: 1929-12-07 Admit Type: Outpatient Age: 84 Room: [facility] ENDO ROOM 4 Gender: Male Note Status: Finalized Procedure:            Colonoscopy Indications:          Weight loss, Incidental change in bowel habits noted Providers:            Lucilla Lame MD, MD Referring MD:         No Local Md, MD (Referring MD) Laneta Simmers Medicines:            Propofol per Anesthesia Complications:        No immediate complications. Procedure:            Pre-Anesthesia Assessment:                       - Prior to the procedure, a History and Physical was                        performed, and patient medications and allergies were                        reviewed. The patient's tolerance of previous                        anesthesia was also reviewed. The risks and benefits of                        the procedure and the sedation options and risks were                        discussed with the patient. All questions were                        answered, and informed consent was obtained. Prior                        Anticoagulants: The patient has taken no previous                        anticoagulant or antiplatelet agents. ASA Grade                        Assessment: II - A patient with mild systemic disease.                        After reviewing the risks and benefits, the patient was                        deemed in satisfactory condition to undergo the                        procedure.                       After obtaining informed consent, the colonoscope was                        passed under direct vision. Throughout the procedure,  the patient's blood pressure, pulse, and oxygen                        saturations were monitored continuously. The                        Colonoscope was introduced through the anus  and                        advanced to the the cecum, identified by appendiceal                        orifice and ileocecal valve. The colonoscopy was                        performed without difficulty. The patient tolerated the                        procedure well. The quality of the bowel preparation                        was excellent. Findings:      The perianal and digital rectal examinations were normal.      A 6 mm polyp was found in the ascending colon. The polyp was sessile.       The polyp was removed with a cold snare. Resection and retrieval were       complete.      A 3 mm polyp was found in the transverse colon. The polyp was sessile.       The polyp was removed with a cold snare. Resection and retrieval were       complete.      A few small-mouthed diverticula were found in the sigmoid colon.      Non-bleeding internal hemorrhoids were found during retroflexion. The       hemorrhoids were Grade I (internal hemorrhoids that do not prolapse). Impression:           - One 6 mm polyp in the ascending colon, removed with a                        cold snare. Resected and retrieved.                       - One 3 mm polyp in the transverse colon, removed with                        a cold snare. Resected and retrieved.                       - Diverticulosis in the sigmoid colon.                       - Non-bleeding internal hemorrhoids. Recommendation:       - Discharge patient to home.                       - Resume previous diet.                       - Continue present medications.                       -  Await pathology results. Procedure Code(s):    --- Professional ---                       [phone], Colonoscopy, flexible; with removal of tumor(s),                        polyp(s), or other lesion(s) by snare technique Diagnosis Code(s):    --- Professional ---                       R63.4, Abnormal weight loss                       D12.2, Benign neoplasm of ascending  colon                       D12.3, Benign neoplasm of transverse colon (hepatic                        flexure or splenic flexure) CPT copyright 2017 American Medical Association. All rights reserved. The codes documented in this report are preliminary and upon coder review may  be revised to meet current compliance requirements. Lucilla Lame MD, MD 10/10/2017 9:19:49 AM This report has been signed electronically. Number of Addenda: 0 Note Initiated On: 10/10/2017 8:46 AM Scope Withdrawal Time: 0 hours 7 minutes 11 seconds  Total Procedure Duration: 0 hours 9 minutes 17 seconds       [facility]

## 2017-10-11 ENCOUNTER — Telehealth: Payer: Self-pay | Admitting: Gastroenterology

## 2017-10-11 LAB — SURGICAL PATHOLOGY

## 2017-10-11 NOTE — Telephone Encounter (Signed)
You prescribed Omeprazole 20mg  BID for 14 days. Based on his EGD that Dr. Allen Norris done yesterday, does he still need to be on the same mg and taking BID?

## 2017-10-11 NOTE — Telephone Encounter (Signed)
Pt had procedure 10/10/17 with Dr. Allen Norris . He was prescribted rx amembrazole and he needs to know how much he needs to take

## 2017-10-12 ENCOUNTER — Encounter: Payer: Self-pay | Admitting: Gastroenterology

## 2017-11-13 ENCOUNTER — Emergency Department: Payer: Medicare Other

## 2017-11-13 ENCOUNTER — Encounter: Payer: Self-pay | Admitting: *Deleted

## 2017-11-13 ENCOUNTER — Emergency Department
Admission: EM | Admit: 2017-11-13 | Discharge: 2017-11-14 | Disposition: A | Discharge status: Home / Self Care | Payer: Medicare Other | Attending: Emergency Medicine | Admitting: Emergency Medicine

## 2017-11-13 ENCOUNTER — Other Ambulatory Visit: Payer: Self-pay

## 2017-11-13 ENCOUNTER — Ambulatory Visit: Payer: Self-pay | Admitting: Gastroenterology

## 2017-11-13 DIAGNOSIS — E860 Dehydration: Secondary | ICD-10-CM | POA: Diagnosis not present

## 2017-11-13 DIAGNOSIS — Z79899 Other long term (current) drug therapy: Secondary | ICD-10-CM | POA: Diagnosis not present

## 2017-11-13 DIAGNOSIS — N189 Chronic kidney disease, unspecified: Secondary | ICD-10-CM | POA: Diagnosis not present

## 2017-11-13 DIAGNOSIS — I129 Hypertensive chronic kidney disease with stage 1 through stage 4 chronic kidney disease, or unspecified chronic kidney disease: Secondary | ICD-10-CM | POA: Insufficient documentation

## 2017-11-13 DIAGNOSIS — R197 Diarrhea, unspecified: Secondary | ICD-10-CM | POA: Diagnosis not present

## 2017-11-13 LAB — COMPREHENSIVE METABOLIC PANEL
ALBUMIN: 4 g/dL (ref 3.5–5.0)
ALT: 13 U/L (ref 0–44)
AST: 22 U/L (ref 15–41)
Alkaline Phosphatase: 61 U/L (ref 38–126)
Anion gap: 10 (ref 5–15)
BUN: 15 mg/dL (ref 8–23)
CHLORIDE: 94 mmol/L — AB (ref 98–111)
CO2: 27 mmol/L (ref 22–32)
Calcium: 9.5 mg/dL (ref 8.9–10.3)
Creatinine, Ser: 1.06 mg/dL (ref 0.61–1.24)
GFR calc Af Amer: >60 mL/min (ref >60)
GFR calc non Af Amer: >60 mL/min (ref >60)
GLUCOSE: 109 mg/dL — AB (ref 70–99)
POTASSIUM: 4 mmol/L (ref 3.5–5.1)
Sodium: 131 mmol/L — ABNORMAL LOW (ref 135–145)
Total Bilirubin: 0.6 mg/dL (ref 0.3–1.2)
Total Protein: 7.2 g/dL (ref 6.5–8.1)

## 2017-11-13 LAB — URINALYSIS, COMPLETE (UACMP) WITH MICROSCOPIC
BACTERIA UA: NONE SEEN
BILIRUBIN URINE: NEGATIVE
GLUCOSE, UA: NEGATIVE mg/dL
Hgb urine dipstick: NEGATIVE
KETONES UR: NEGATIVE mg/dL
LEUKOCYTES UA: NEGATIVE
Nitrite: NEGATIVE
PROTEIN: NEGATIVE mg/dL
Specific Gravity, Urine: 1.013 (ref 1.005–1.030)
Squamous Epithelial / LPF: NONE SEEN (ref 0–5)
pH: 5 (ref 5.0–8.0)

## 2017-11-13 LAB — TROPONIN I: Troponin I: <0.03 ng/mL (ref <0.03)

## 2017-11-13 LAB — CBC
HEMATOCRIT: 44.9 % (ref 40.0–52.0)
Hemoglobin: 15.2 g/dL (ref 13.0–18.0)
MCH: 28.8 pg (ref 26.0–34.0)
MCHC: 33.8 g/dL (ref 32.0–36.0)
MCV: 85.4 fL (ref 80.0–100.0)
Platelets: 131 10*3/uL — ABNORMAL LOW (ref 150–440)
RBC: 5.26 MIL/uL (ref 4.40–5.90)
RDW: 14.4 % (ref 11.5–14.5)
WBC: 7.2 10*3/uL (ref 3.8–10.6)

## 2017-11-13 LAB — LACTIC ACID, PLASMA: Lactic Acid, Venous: 1.7 mmol/L (ref 0.5–1.9)

## 2017-11-13 LAB — C DIFFICILE QUICK SCREEN W PCR REFLEX
C DIFFICILE (CDIFF) TOXIN: NEGATIVE
C Diff antigen: NEGATIVE
C Diff interpretation: NOT DETECTED

## 2017-11-13 LAB — LIPASE, BLOOD: LIPASE: 22 U/L (ref 11–51)

## 2017-11-13 MED ORDER — SODIUM CHLORIDE 0.9 % IV BOLUS
1000.0000 mL | Freq: Once | INTRAVENOUS | Status: AC
  Administered 2017-11-13: 1000 mL via INTRAVENOUS

## 2017-11-13 NOTE — ED Notes (Signed)
Pt states he has had persistent diarrhea x 1 week. Has taken otc without relief, states 3-4 episodes today. Has had nausea no vomiting, states does have low back pain rates it 3-10. Denies any hx of the same or abd pain.

## 2017-11-13 NOTE — ED Notes (Signed)
Stool sample sent to lab

## 2017-11-13 NOTE — ED Notes (Signed)
Dr. Joni Fears in to eval

## 2017-11-13 NOTE — ED Triage Notes (Signed)
Pt to triage via wheelchair.  Pt reports abd pain and  diarrhea x 4 today.  Pt taking immodium and pepto without relief.  Pt alert

## 2017-11-13 NOTE — Discharge Instructions (Addendum)
You may take medicines as needed for abdominal discomfort and nausea (Bentyl/Zofran #20).  You may start the antibiotic if you are still having diarrhea by Thursday (Cipro 500 mg twice daily x5 days).  Eat a brat diet for the next week, then slowly advance diet as tolerated.  Drink plenty of fluids daily. Return to the ER for worsening symptoms, persistent vomiting, difficulty breathing or other concerns.

## 2017-11-13 NOTE — ED Provider Notes (Signed)
[facility] Emergency Department Provider Note  ____________________________________________  Time seen: Approximately 10:50 PM  I have reviewed the triage vital signs and the nursing notes.   HISTORY  Chief Complaint Diarrhea    HPI Scott Ashley is a 84 y.o. male with a history of hypertension and renal insufficiency who comes to the ED complaining of diarrhea for the past week.  He is having about 4 5 liquid bowel movements a day.  He also reports nausea and loss of appetite and very poor oral intake over the past several days.  Has generalized weakness.  No vomiting.  Denies any abdominal pain whatsoever.  No fever or chills.  He did have a colonoscopy about a month ago which was uneventful according to patient.  Review of procedure note finds One 6 mm polyp in the ascending colon, removed with a cold snare. Resected and retrieved. - One 3 mm polyp in the transverse colon, removed with a cold snare. Resected and retrieved. - Diverticulosis in the sigmoid colon. - Non-bleeding internal hemorrhoids  He also had an upper endoscopy which resulted in : benign-appearing esophageal stenosis. Dilated. - Large hiatal hernia. - Gastritis. Biopsied. - Duodenitis.    Past Medical History:  Diagnosis Date  . Hypertension      Patient Active Problem List   Diagnosis Date Noted  . Renal insufficiency 01/17/2014  . Hypertension 01/17/2014  . History of phlebitis 01/17/2014  . Glaucoma (increased eye pressure) 01/17/2014  . Gastro-esophageal reflux disease with esophagitis 01/17/2014     Past Surgical History:  Procedure Laterality Date  . COLONOSCOPY WITH PROPOFOL N/A 10/10/2017   Procedure: COLONOSCOPY WITH PROPOFOL;  Surgeon: Lucilla Lame, MD;  Location: [facility] ENDOSCOPY;  Service: Endoscopy;  Laterality: N/A;  . ESOPHAGOGASTRODUODENOSCOPY (EGD) WITH PROPOFOL N/A 10/10/2017   Procedure: ESOPHAGOGASTRODUODENOSCOPY (EGD) WITH PROPOFOL;  Surgeon: Lucilla Lame, MD;  Location: [facility] ENDOSCOPY;  Service: Endoscopy;  Laterality: N/A;  . HERNIA REPAIR     twice  . TONSILLECTOMY       Prior to Admission medications   Medication Sig Start Date End Date Taking? Authorizing Provider  bisoprolol-hydrochlorothiazide (ZIAC) 5-6.25 MG tablet TK 1 T PO QD FOR BLOOD PRESSURE 05/27/17   [provider]  cetirizine (ZYRTEC) 5 MG tablet TK 1 T PO  QD 05/18/17   [provider]  fluticasone (FLONASE) 50 MCG/ACT nasal spray INSTILL 1 SPRAY IEN QD FOR ALLERGIES 05/18/17   [provider]  latanoprost (XALATAN) 0.005 % ophthalmic solution INT 1 GTT IN OU QHS 05/01/17   [provider]  omeprazole (PRILOSEC) 20 MG capsule TAKE 1 CAPSULE BY MOUTH TWICE DAILY BEFORE A MEAL FOR 14 DAYS Patient not taking: Reported on 10/10/2017 07/19/17   Virgel Manifold, MD  PHENObarbital (LUMINAL) 30 MG tablet TK 1 T PO ONCE A DAY PRA 04/26/17   [provider]  simethicone (MYLICON) 034 MG chewable tablet Chew 1 tablet (125 mg total) by mouth every 6 (six) hours as needed for flatulence (Gas). 08/08/17 09/07/17  Virgel Manifold, MD  tamsulosin (FLOMAX) 0.4 MG CAPS capsule TK 1 C PO QD 30 MINUTES AFTER THE SAME MEAL 06/26/17   [provider]     Allergies Codeine   No family history on file.  Social History Social History   Tobacco Use  . Smoking status: Never Smoker  . Smokeless tobacco: Never Used  Substance Use Topics  . Alcohol use: Never    Frequency: Never  . Drug use:  Never    Review of Systems  Constitutional:   No fever or chills.  ENT:   No sore throat. No rhinorrhea. Cardiovascular:   No chest pain or syncope. Respiratory:   No dyspnea or cough. Gastrointestinal:   Negative for abdominal pain or vomiting.  Positive diarrhea for 1 week.  Musculoskeletal:   Negative for focal pain or swelling All other systems reviewed and are negative except as documented above in ROS and  HPI.  ____________________________________________   PHYSICAL EXAM:  VITAL SIGNS: ED Triage Vitals  Enc Vitals Group     BP 11/13/17 1745 (!) 143/66     Pulse Rate 11/13/17 1745 89     Resp --      Temp 11/13/17 1745 97.8 F (36.6 C)     Temp Source 11/13/17 1745 Oral     SpO2 11/13/17 1745 96 %     Weight 11/13/17 1745 171 lb (77.6 kg)     Height 11/13/17 1745 6' (1.829 m)     Head Circumference --      Peak Flow --      Pain Score 11/13/17 1749 0     Pain Loc --      Pain Edu? --      Excl. in [place]? --     Vital signs reviewed, nursing assessments reviewed.   Constitutional:   Alert and oriented. Non-toxic appearance. Eyes:   Conjunctivae are normal. EOMI. PERRL. ENT      Head:   Normocephalic and atraumatic.      Nose:   No congestion/rhinnorhea.       Mouth/Throat:   Dry mucous membranes, no pharyngeal erythema. No peritonsillar mass.       Neck:   No meningismus. Full ROM. Hematological/Lymphatic/Immunilogical:   No cervical lymphadenopathy. Cardiovascular:   RRR. Symmetric bilateral radial and DP pulses.  No murmurs. Cap refill less than 2 seconds. Respiratory:   Normal respiratory effort without tachypnea/retractions. Breath sounds are clear and equal bilaterally. No wheezes/rales/rhonchi. Gastrointestinal:   Soft and nontender. Non distended. There is no CVA tenderness.  No rebound, rigidity, or guarding.  Rectal exam reveals enlarged prostate, Hemoccult negative. Musculoskeletal:   Normal range of motion in all extremities. No joint effusions.  No lower extremity tenderness.  No edema. Neurologic:   Normal speech and language.  Motor grossly intact. No acute focal neurologic deficits are appreciated.  Skin:    Skin is warm, dry and intact. No rash noted.  No petechiae, purpura, or bullae.  ____________________________________________    LABS (pertinent positives/negatives) (all labs ordered are listed, but only abnormal results are displayed) Labs Reviewed   COMPREHENSIVE METABOLIC PANEL - Abnormal; Notable for the following components:      Result Value   Sodium 131 (*)    Chloride 94 (*)    Glucose, Bld 109 (*)    All other components within normal limits  CBC - Abnormal; Notable for the following components:   Platelets 131 (*)    All other components within normal limits  URINALYSIS, COMPLETE (UACMP) WITH MICROSCOPIC - Abnormal; Notable for the following components:   Color, Urine YELLOW (*)    APPearance CLEAR (*)    All other components within normal limits  C DIFFICILE QUICK SCREEN W PCR REFLEX  GASTROINTESTINAL PANEL BY PCR, STOOL (REPLACES STOOL CULTURE)  LIPASE, BLOOD  TROPONIN I  LACTIC ACID, PLASMA  LACTIC ACID, PLASMA   ____________________________________________   EKG Interpreted by me Atrial fibrillation rate of 86, normal axis  intervals QRS ST segments and T waves   ____________________________________________    RADIOLOGY  Dg Abdomen Acute W/chest  Result Date: 11/13/2017 CLINICAL DATA:  Persistent diarrhea for 1 week. Nausea without vomiting. Low back pain. EXAM: DG ABDOMEN ACUTE W/ 1V CHEST COMPARISON:  CT abdomen and pelvis 08/24/2017. Abdominal series 07/07/2017. FINDINGS: Borderline heart size. No vascular congestion, edema, or consolidation. No blunting of costophrenic angles. No pneumothorax. Mediastinal contours appear intact. Calcified and tortuous aorta. Esophageal hiatal hernia behind the heart. Old left rib fractures. Old fracture deformities of the left clavicle and left proximal humerus. Scattered gas in the small and large bowel without abnormal distention. This likely represents normal bowel-gas or mild ileus. No free intra-abdominal air. No abnormal air-fluid levels. No radiopaque stones. Degenerative changes in the spine and hips. Soft tissue contours appear intact. Vascular calcifications. IMPRESSION: No evidence of active pulmonary disease. Nondistended gas-filled small and large bowel may  indicate normal pattern or mild ileus. No evidence of obstruction. Electronically Signed   By: Lucienne Capers M.D.   On: 11/13/2017 21:46    ____________________________________________   PROCEDURES Procedures  ____________________________________________  DIFFERENTIAL DIAGNOSIS   Infectious diarrhea, C. difficile colitis, mesenteric ischemia though unlikely with absence of pain, dehydration  CLINICAL IMPRESSION / ASSESSMENT AND PLAN / ED COURSE  Pertinent labs & imaging results that were available during my care of the patient were reviewed by me and considered in my medical decision making (see chart for details).    Patient presents with diarrhea for a week.  With atrial fibrillation and age, mesenteric ischemia is on the differential but absence of pain makes this highly unlikely.  I will check a lactic acid as a screening measure and if elevated check a CT angiogram of the abdomen.  Clinically he is dehydrated though his labs and vital signs are unremarkable.  I will give IV fluids for hydration while checking a C. difficile and GI panel.  Exam is negative for occult blood  Clinical Course as of Nov 13 2248  Mon Nov 13, 2017  2249 In context of no pain, no vomiting, abdominal x-ray is not consistent with obstruction or ileus.  DG Abdomen Acute W/Chest [PS]    Clinical Course User Index [PS] Carrie Mew, MD     ____________________________________________   FINAL CLINICAL IMPRESSION(S) / ED DIAGNOSES    Final diagnoses:  Diarrhea, unspecified type  Dehydration     ED Discharge Orders    None      Portions of this note were generated with dragon dictation software. Dictation errors may occur despite best attempts at proofreading.    Carrie Mew, MD 11/13/17 2258

## 2017-11-14 LAB — GASTROINTESTINAL PANEL BY PCR, STOOL (REPLACES STOOL CULTURE)

## 2017-11-14 MED ORDER — CIPROFLOXACIN HCL 500 MG PO TABS: 500.0000 mg | ORAL_TABLET | Freq: Two times a day (BID) | ORAL | 0 refills | Status: AC

## 2017-11-14 MED ORDER — DICYCLOMINE HCL 20 MG PO TABS: 20.0000 mg | ORAL_TABLET | Freq: Four times a day (QID) | ORAL | 0 refills | Status: DC | PRN

## 2017-11-14 MED ORDER — ONDANSETRON 4 MG PO TBDP: 4.0000 mg | ORAL_TABLET | Freq: Three times a day (TID) | ORAL | 0 refills | Status: DC | PRN

## 2017-11-14 NOTE — ED Provider Notes (Signed)
-----------------------------------------   12:43 AM on 11/14/2017 -----------------------------------------  C. difficile, bio fire and lactate negative.  Patient sipping on drink in no acute distress.  Denies pain or vomiting.  Reviewed all lab results.  Granddaughter concerned for continuing diarrhea.  I did offer a CT scan tonight but they declined as he has had one within the past several months.  They do agree on prescriptions for Bentyl and Zofran to use as needed.  We did discuss using Imodium sparingly.  They are concerned what to do if he continues to have diarrhea.  I did write a prescription for Cipro and instructed patient to start it on Thursday if diarrhea persists.  Very strict return precautions given.  Both verbalized understanding and agree with plan of care.    Paulette Blanch, MD 11/14/17 [phone]

## 2017-11-14 NOTE — ED Notes (Signed)
Dr. Beather Arbour in to speak with pt

## 2017-11-14 NOTE — ED Notes (Signed)
Patient discharged to home per MD order. Patient in stable condition, and deemed medically cleared by ED provider for discharge. Discharge instructions reviewed with patient/family using "Teach Back"; verbalized understanding of medication education and administration, and information about follow-up care. Denies further concerns. ° °

## 2017-11-16 ENCOUNTER — Other Ambulatory Visit: Payer: Self-pay | Admitting: Family

## 2017-11-16 DIAGNOSIS — N289 Disorder of kidney and ureter, unspecified: Secondary | ICD-10-CM

## 2017-12-07 ENCOUNTER — Ambulatory Visit
Admission: RE | Admit: 2017-12-07 | Discharge: 2017-12-07 | Disposition: A | Discharge status: Home / Self Care | Payer: Medicare Other | Source: Ambulatory Visit | Attending: Family | Admitting: Family

## 2017-12-07 DIAGNOSIS — N289 Disorder of kidney and ureter, unspecified: Secondary | ICD-10-CM | POA: Insufficient documentation

## 2017-12-07 MED ORDER — GADOBUTROL 1 MMOL/ML IV SOLN
7.3000 mL | Freq: Once | INTRAVENOUS | Status: AC | PRN
  Administered 2017-12-07: 7.3 mL via INTRAVENOUS

## 2017-12-18 ENCOUNTER — Ambulatory Visit (INDEPENDENT_AMBULATORY_CARE_PROVIDER_SITE_OTHER): Payer: Medicare Other | Admitting: Urology

## 2017-12-18 ENCOUNTER — Other Ambulatory Visit: Payer: Self-pay

## 2017-12-18 ENCOUNTER — Encounter: Payer: Self-pay | Admitting: Urology

## 2017-12-18 VITALS — BP 137/78 | HR 82 | Ht 72.0 in | Wt 172.6 lb

## 2017-12-18 DIAGNOSIS — N289 Disorder of kidney and ureter, unspecified: Secondary | ICD-10-CM | POA: Diagnosis not present

## 2017-12-18 DIAGNOSIS — N2889 Other specified disorders of kidney and ureter: Secondary | ICD-10-CM | POA: Diagnosis not present

## 2017-12-18 LAB — URINALYSIS, COMPLETE
BILIRUBIN UA: NEGATIVE
GLUCOSE, UA: NEGATIVE
Ketones, UA: NEGATIVE
Leukocytes, UA: NEGATIVE
Nitrite, UA: NEGATIVE
PH UA: 6 (ref 5.0–7.5)
PROTEIN UA: NEGATIVE
RBC, UA: NEGATIVE
Specific Gravity, UA: 1.02 (ref 1.005–1.030)
Urobilinogen, Ur: 0.2 mg/dL (ref 0.2–1.0)

## 2017-12-18 NOTE — Progress Notes (Signed)
12/18/2017 5:42 PM   Scott Ashley Feb 11, 1929 [id number]  Referring provider: Laneta Simmers, NP [place], [place] [id number]  CC: Left renal mass, <1cm  HPI: I had the pleasure of seeing Scott Ashley in urology clinic today in consultation for a left renal mass from Magdalene Molly, NP.  He is an 84 year old relatively healthy man who was incidentally found to have a left 1 cm hyperdense exophytic lesion of the left kidney on CT scan with contrast in July 2019.  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  This had not enlarged from the prior CT 3 months previously.  He is completely asymptomatic and denies flank pain or hematuria.  He denies family history of kidney cancer.  He denies any weight loss or bone pain.  There are no aggravating or alleviating factors.  Of note, prostate measures 166 cc on recent CT.  He is on Flomax for mild to moderate urinary symptoms.   PMH: Past Medical History:  Diagnosis Date  . Hypertension     Surgical History: Past Surgical History:  Procedure Laterality Date  . COLONOSCOPY WITH PROPOFOL N/A 10/10/2017   Procedure: COLONOSCOPY WITH PROPOFOL;  Surgeon: Lucilla Lame, MD;  Location: [facility] ENDOSCOPY;  Service: Endoscopy;  Laterality: N/A;  . ESOPHAGOGASTRODUODENOSCOPY (EGD) WITH PROPOFOL N/A 10/10/2017   Procedure: ESOPHAGOGASTRODUODENOSCOPY (EGD) WITH PROPOFOL;  Surgeon: Lucilla Lame, MD;  Location: [facility] ENDOSCOPY;  Service: Endoscopy;  Laterality: N/A;  . HERNIA REPAIR     twice  . TONSILLECTOMY      Allergies:  Allergies  Allergen Reactions  . Codeine Other (See Comments)    Family History: History reviewed. No pertinent family history.  Social History:  reports that he has never smoked. He has never used smokeless tobacco. He reports that he does not drink alcohol or use  drugs.  ROS: Please see flowsheet from today's date for complete review of systems.  Physical Exam: BP 137/78   Pulse 82   Ht 6' (1.829 m)   Wt 172 lb 9.6 oz (78.3 kg)   BMI 23.41 kg/m    Constitutional:  Alert and oriented, No acute distress. Cardiovascular: No clubbing, cyanosis, or edema. Respiratory: Normal respiratory effort, no increased work of breathing. GI: Abdomen is soft, nontender, nondistended, no abdominal masses GU: No CVA tendernessDRE: Deferred Lymph: No cervical or inguinal lymphadenopathy. Skin: No rashes, bruises or suspicious lesions. Neurologic: Grossly intact, no focal deficits, moving all 4 extremities. Psychiatric: Normal mood and affect.  Laboratory Data: Creatinine 1.06, EGFR greater than 60  Pertinent Imaging: I have personally reviewed the CT and MRI.  9 mm left exophytic renal lesion consistent with renal neoplasm, favor papillary RCC  Assessment & Plan:   In summary, Scott Ashley is a relatively healthy 84 year old male with a very small enhancing left renal mass 9 mm in size consistent with a possible renal neoplasm, favor papillary RCC.  He is asymptomatic.  We had a very long conversation today about these findings, and that the risk of metastasis from a less than 4 cm enhancing renal mass over 10 to 15-year period is less than 3%.  With his age and comorbidities I strongly recommended active surveillance.  I do not feel that a biopsy would change management at this time.  He has had 2 cross-sectional imaging tests with the CT and MRI over the  last 6 months, and I will see him back in 1 year with a renal ultrasound to evaluate for any significant growth.  I reassured him and his family that this is extremely unlikely to cause any symptoms or problems over the rest of his lifetime.  Return in about 1 year (around 12/19/2018) for renal ultrasound, discuss BPH.  [place], [facility] [address] [place], [place] [address] [phone]

## 2018-11-27 ENCOUNTER — Other Ambulatory Visit: Payer: Self-pay

## 2018-11-27 ENCOUNTER — Ambulatory Visit (INDEPENDENT_AMBULATORY_CARE_PROVIDER_SITE_OTHER): Payer: Medicare Other | Admitting: Family Medicine

## 2018-11-27 ENCOUNTER — Encounter: Payer: Self-pay | Admitting: Family Medicine

## 2018-11-27 VITALS — BP 147/88 | HR 109 | Temp 98.4°F | Resp 18 | Ht 72.0 in | Wt 185.0 lb

## 2018-11-27 DIAGNOSIS — B354 Tinea corporis: Secondary | ICD-10-CM | POA: Insufficient documentation

## 2018-11-27 DIAGNOSIS — I10 Essential (primary) hypertension: Secondary | ICD-10-CM | POA: Diagnosis not present

## 2018-11-27 DIAGNOSIS — Z7689 Persons encountering health services in other specified circumstances: Secondary | ICD-10-CM

## 2018-11-27 MED ORDER — TERBINAFINE HCL 250 MG PO TABS: 250.0000 mg | ORAL_TABLET | Freq: Every day | ORAL | 0 refills | Status: DC

## 2018-11-27 MED ORDER — CICLOPIROX OLAMINE 0.77 % EX CREA: TOPICAL_CREAM | Freq: Two times a day (BID) | CUTANEOUS | 1 refills | Status: DC

## 2018-11-27 NOTE — Progress Notes (Signed)
Subjective:    Patient ID: Scott Ashley, male    DOB: [birth year], 85 y.o.   MRN: [id number]  Scott Ashley is a 85 y.o. male presenting on 11/27/2018 for Establish Care  Here for establish care. Change PCP (to our office). He is accompanied by Ulyses Southward, who provides additional history.  HPI   Rash / Tinea - lower extremity Reports 2 years with rash on Right foot on top dorsal area with itching and skin maceration. He was treated initially with a pill for yeast infection once a week for a month, and then it came back. He has taken topical antifungals regularly with limited results. Asking for further treatment now, has not seen dermatology Admits itching Denies any pain or redness spreading, drainage  CHRONIC HTN: Reports no new concerns. Checking BP Current Meds - Bisoprolol-HCTZ 5-6.25mg    Reports good compliance, took meds today. Tolerating well, w/o complaints. Denies CP, dyspnea, HA, edema, dizziness / lightheadedness   History of H Pylori Gastric / Abnormal bowel habits - He was on dual antibiotic therapy, sensitive stomach from antibiotics. Has improved - S/p Colonoscopy 10/2017 [facility]  Additional questions - Asks about immune system, prior history had lab result unsure which, low that was concerning. No result available at this time suggests. He has had normal WBC recently  Prior provider on Phenobarbital - to sleep now off, now on mirtazapine  BPH - Flomax with good results. Controlled.   Health Maintenance: Due for Flu Shot, declines today despite counseling on benefits Offered PNA vaccine he declines  Depression screen [facility] 2/9 11/27/2018  Decreased Interest 0  Down, Depressed, Hopeless 0  PHQ - 2 Score 0  Altered sleeping 0  Tired, decreased energy 0  Change in appetite 0  Feeling bad or failure about yourself  0  Trouble concentrating 0  Moving slowly or fidgety/restless 0  Suicidal thoughts 0  PHQ-9 Score 0    Past Medical History:    Diagnosis Date   Anxiety    Glaucoma    Hypertension    Urinary incontinence    Past Surgical History:  Procedure Laterality Date   COLONOSCOPY WITH PROPOFOL N/A 10/10/2017   Procedure: COLONOSCOPY WITH PROPOFOL;  Surgeon: Lucilla Lame, MD;  Location: [facility] ENDOSCOPY;  Service: Endoscopy;  Laterality: N/A;   ESOPHAGOGASTRODUODENOSCOPY (EGD) WITH PROPOFOL N/A 10/10/2017   Procedure: ESOPHAGOGASTRODUODENOSCOPY (EGD) WITH PROPOFOL;  Surgeon: Lucilla Lame, MD;  Location: [facility] ENDOSCOPY;  Service: Endoscopy;  Laterality: N/A;   HERNIA REPAIR     twice   TONSILLECTOMY     Social History   Socioeconomic History   Marital status: Married    Spouse name: Not on file   Number of children: Not on file   Years of education: High School   Highest education level: High school graduate  Occupational History   Not on file  Social Needs   Financial resource strain: Not on file   Food insecurity    Worry: Not on file    Inability: Not on file   Transportation needs    Medical: Not on file    Non-medical: Not on file  Tobacco Use   Smoking status: Never Smoker   Smokeless tobacco: Never Used  Substance and Sexual Activity   Alcohol use: Never    Frequency: Never   Drug use: Never   Sexual activity: Not on file  Lifestyle   Physical activity    Days per week: 5 days    Minutes per session:  30 min   Stress: Not on file  Relationships   Social connections    Talks on phone: Not on file    Gets together: Not on file    Attends religious service: Not on file    Active member of club or organization: Not on file    Attends meetings of clubs or organizations: Not on file    Relationship status: Not on file   Intimate partner violence    Fear of current or ex partner: Not on file    Emotionally abused: Not on file    Physically abused: Not on file    Forced sexual activity: Not on file  Other Topics Concern   Not on file  Social History Narrative   Not on  file   History reviewed. No pertinent family history. Current Outpatient Medications on File Prior to Visit  Medication Sig   aspirin EC 81 MG tablet Take 81 mg by mouth daily.   bisoprolol-hydrochlorothiazide (ZIAC) 5-6.25 MG tablet TK 1 T PO QD FOR BLOOD PRESSURE   latanoprost (XALATAN) 0.005 % ophthalmic solution INT 1 GTT IN OU QHS   tamsulosin (FLOMAX) 0.4 MG CAPS capsule TK 1 C PO QD 30 MINUTES AFTER THE SAME MEAL   cetirizine (ZYRTEC) 5 MG tablet TK 1 T PO  QD   fluticasone (FLONASE) 50 MCG/ACT nasal spray INSTILL 1 SPRAY IEN QD FOR ALLERGIES   mirtazapine (REMERON) 7.5 MG tablet Take 7.5 mg by mouth at bedtime.   No current facility-administered medications on file prior to visit.     Review of Systems Per HPI unless specifically indicated above      Objective:    BP (!) 147/88 (BP Location: Left Arm, Patient Position: Sitting, Cuff Size: Normal)    Pulse (!) 109    Temp 98.4 F (36.9 C) (Oral)    Resp 18    Ht 6' (1.829 m)    Wt 185 lb (83.9 kg)    SpO2 99%    BMI 25.09 kg/m   Wt Readings from Last 3 Encounters:  11/27/18 185 lb (83.9 kg)  12/18/17 172 lb 9.6 oz (78.3 kg)  11/13/17 171 lb (77.6 kg)    Physical Exam Vitals signs and nursing note reviewed.  Constitutional:      General: He is not in acute distress.    Appearance: He is well-developed. He is not diaphoretic.     Comments: Well-appearing, comfortable, cooperative  HENT:     Head: Normocephalic and atraumatic.  Eyes:     General:        Right eye: No discharge.        Left eye: No discharge.     Conjunctiva/sclera: Conjunctivae normal.  Cardiovascular:     Rate and Rhythm: Normal rate.  Pulmonary:     Effort: Pulmonary effort is normal.  Skin:    General: Skin is warm and dry.     Findings: Rash (several areas of discoloration with some flaky skin consistent with tinea R lower leg knee, leg, and foot) present. No erythema.  Neurological:     Mental Status: He is alert and oriented to  person, place, and time.  Psychiatric:        Behavior: Behavior normal.     Comments: Well groomed, good eye contact, normal speech and thoughts    Results for orders placed or performed in visit on 12/18/17  Urinalysis, Complete  Result Value Ref Range   Specific Gravity, UA 1.020 1.005 - 1.030  pH, UA 6.0 5.0 - 7.5   Color, UA Yellow Yellow   Appearance Ur Clear Clear   Leukocytes, UA Negative Negative   Protein, UA Negative Negative/Trace   Glucose, UA Negative Negative   Ketones, UA Negative Negative   RBC, UA Negative Negative   Bilirubin, UA Negative Negative   Urobilinogen, Ur 0.2 0.2 - 1.0 mg/dL   Nitrite, UA Negative Negative      Assessment & Plan:   Problem List Items Addressed This Visit    Tinea corporis - Primary   Relevant Medications   ciclopirox (CICLODAN) 0.77 % cream   terbinafine (LAMISIL) 250 MG tablet   Hypertension   Relevant Medications   aspirin EC 81 MG tablet    Other Visit Diagnoses    Encounter to establish care with new doctor        request outside records for review.    #Tinea Chronic, tinea corporis Trial on oral terbinafine 250mg  daily x 2 weeks, then transition to topical Ciclopirox 1-2 weeks can continue PRN in future if need or consider diflucan  #HTN Mild elevated BP now On medication Bisoprolol HCTZ 5-6.25mg  with improvement Encourage close monitor home BP  Meds ordered this encounter  Medications   ciclopirox (CICLODAN) 0.77 % cream    Sig: Apply topically 2 (two) times daily. For 1-2 weeks, after finish pill. Can repeat as needed.    Dispense:  30 g    Refill:  1   terbinafine (LAMISIL) 250 MG tablet    Sig: Take 1 tablet (250 mg total) by mouth daily. For 2 weeks    Dispense:  14 tablet    Refill:  0      Follow up plan: Return in about 6 months (around 05/28/2019) for 5 months for Annual Physical .  [name], DO [facility] 11/27/2018, 2:26 PM

## 2018-11-27 NOTE — Patient Instructions (Addendum)
Thank you for coming to the office today.  Start new anti fungal medicine - Terbinafine 250mg  once daily for 2 weeks - then stop and start the TOPICAL Ciclopirox twice a day for 1-2 weeks, then can use it again if needed for flare up.  We may be able to repeat Terbinafine or add the other yeast pill if needed, otherwise we can refer to Dermatology.  Call back for a Virtual Telephone visit if this is not improved then we can schedule that.  Continue current meds. Updated list, let me know if any adjustments needed.  We will request record / labs from [facility], stay tuned and we can review at next visit.   DUE for FASTING BLOOD WORK (no food or drink after midnight before the lab appointment, only water or coffee without cream/sugar on the morning of)  SCHEDULE "Lab Only" visit in the morning at the clinic for lab draw in 5 MONTHS   - Make sure Lab Only appointment is at about 1 week before your next appointment, so that results will be available  For Lab Results, once available within 2-3 days of blood draw, you can can log in to MyChart online to view your results and a brief explanation. Also, we can discuss results at next follow-up visit.   Please schedule a Follow-up Appointment to: Return in about 6 months (around 05/28/2019) for 5 months for Annual Physical .  If you have any other questions or concerns, please feel free to call the office or send a message through [place]. You may also schedule an earlier appointment if necessary.  Additionally, you may be receiving a survey about your experience at our office within a few days to 1 week by e-mail or mail. We value your feedback.  Scott Putnam, DO [place]

## 2018-12-14 ENCOUNTER — Other Ambulatory Visit: Payer: Self-pay

## 2018-12-14 ENCOUNTER — Ambulatory Visit
Admission: RE | Admit: 2018-12-14 | Discharge: 2018-12-14 | Disposition: A | Discharge status: Home / Self Care | Payer: Medicare Other | Source: Ambulatory Visit | Attending: Urology | Admitting: Urology

## 2018-12-14 DIAGNOSIS — N2889 Other specified disorders of kidney and ureter: Secondary | ICD-10-CM | POA: Diagnosis not present

## 2018-12-17 ENCOUNTER — Other Ambulatory Visit: Payer: Self-pay

## 2018-12-17 ENCOUNTER — Encounter: Payer: Self-pay | Admitting: Urology

## 2018-12-17 ENCOUNTER — Ambulatory Visit (INDEPENDENT_AMBULATORY_CARE_PROVIDER_SITE_OTHER): Payer: Medicare Other | Admitting: Urology

## 2018-12-17 VITALS — BP 115/68 | HR 76 | Ht 72.0 in | Wt 185.3 lb

## 2018-12-17 DIAGNOSIS — N401 Enlarged prostate with lower urinary tract symptoms: Secondary | ICD-10-CM | POA: Diagnosis not present

## 2018-12-17 DIAGNOSIS — N2889 Other specified disorders of kidney and ureter: Secondary | ICD-10-CM

## 2018-12-17 DIAGNOSIS — N138 Other obstructive and reflux uropathy: Secondary | ICD-10-CM

## 2018-12-17 MED ORDER — TAMSULOSIN HCL 0.4 MG PO CAPS: ORAL_CAPSULE | ORAL | 3 refills | Status: DC

## 2018-12-17 NOTE — Progress Notes (Signed)
   12/17/2018 10:58 AM   [name] 1930/01/23 [id number]  Reason for visit: Follow up BPH, small left renal mass  HPI: I saw Mr. Alsman back in urology clinic for follow-up of the above.  He is an 85 year old relatively healthy male with a very enlarged prostate greater than 100 cc and a small 1 cm left enhancing renal mass worrisome for possible papillary RCC.  At our last visit, we elected for active surveillance of this lesion.  Since we saw him last, he has added "prostatagenix" over-the-counter to his Flomax which he feels has improved his urinary symptoms which she feels has helped his urinary symptoms.  He denies any specific urinary complaints today, and has nocturia and weak stream have improved.  He denies any gross hematuria or UTIs.  He denies any flank pain, weight loss, or bone pain.  Renal ultrasound 11/7 shows no change in his small left renal mass, remains 1 cm in size.  I had another long conversation with the patient about active surveillance for his small left renal mass, and the good news that it has not significantly changed in size over the last 18 months.  I recommended ongoing active surveillance with a repeat renal ultrasound in 1 year.  Regarding his urinary symptoms, I recommended continuing Flomax, as he has really minimal urinary symptoms at this time.  We discussed return precautions including worsening urinary symptoms, gross hematuria or recurrent UTIs.  RTC 1 year with renal ultrasound prior for small left renal mass active surveillance  A total of 15 minutes were spent face-to-face with the patient, greater than 50% was spent in patient education, counseling, and coordination of care regarding small renal mass active surveillance and BPH.  Billey Co, [facility] [address], [place], [place] [address] [phone]

## 2018-12-17 NOTE — Patient Instructions (Signed)

## 2018-12-28 ENCOUNTER — Other Ambulatory Visit: Payer: Self-pay | Admitting: Family Medicine

## 2018-12-28 DIAGNOSIS — B354 Tinea corporis: Secondary | ICD-10-CM

## 2018-12-28 MED ORDER — CLOTRIMAZOLE-BETAMETHASONE 1-0.05 % EX CREA: TOPICAL_CREAM | Freq: Two times a day (BID) | CUTANEOUS | 1 refills | Status: DC

## 2019-02-18 ENCOUNTER — Other Ambulatory Visit: Payer: Self-pay | Admitting: Family Medicine

## 2019-02-18 DIAGNOSIS — B354 Tinea corporis: Secondary | ICD-10-CM

## 2019-02-26 ENCOUNTER — Telehealth: Payer: Self-pay | Admitting: Family Medicine

## 2019-02-26 DIAGNOSIS — F5104 Psychophysiologic insomnia: Secondary | ICD-10-CM

## 2019-02-26 DIAGNOSIS — I10 Essential (primary) hypertension: Secondary | ICD-10-CM

## 2019-02-26 MED ORDER — BISOPROLOL-HYDROCHLOROTHIAZIDE 5-6.25 MG PO TABS: 1.0000 | ORAL_TABLET | Freq: Every day | ORAL | 1 refills | Status: DC

## 2019-02-26 MED ORDER — MIRTAZAPINE 15 MG PO TABS: 15.0000 mg | ORAL_TABLET | Freq: Every day | ORAL | 1 refills | Status: DC

## 2019-02-26 NOTE — Telephone Encounter (Signed)
Pt. granddaughter called requesting refill on bisoprolol  (Ziac) 5-6.25 mg, Mirtazapine 7.5 mg  Pt states that he take 2 of these pills at  Night.  Pt call back # is  [phone]

## 2019-02-26 NOTE — Telephone Encounter (Signed)
Confirmed with Arby Barrette (granddaughter) that he would prefer a mirtazapine 15mg  pill nightly instead of x 2 of the 7.5mg  tabs. Refilled both 90 days  [name], [facility] 02/26/2019, 4:46 PM

## 2019-04-09 ENCOUNTER — Encounter: Payer: Self-pay | Admitting: Family Medicine

## 2019-04-09 ENCOUNTER — Ambulatory Visit (INDEPENDENT_AMBULATORY_CARE_PROVIDER_SITE_OTHER): Payer: Medicare Other | Admitting: Family Medicine

## 2019-04-09 ENCOUNTER — Other Ambulatory Visit: Payer: Self-pay

## 2019-04-09 DIAGNOSIS — L282 Other prurigo: Secondary | ICD-10-CM | POA: Diagnosis not present

## 2019-04-09 DIAGNOSIS — B354 Tinea corporis: Secondary | ICD-10-CM | POA: Diagnosis not present

## 2019-04-09 MED ORDER — CLOTRIMAZOLE-BETAMETHASONE 1-0.05 % EX CREA: TOPICAL_CREAM | CUTANEOUS | 0 refills | Status: DC

## 2019-04-09 NOTE — Patient Instructions (Addendum)
Referral to   [facility]   [place], [place] [address] Hours: 8AM-5PM Phone: [phone]    Please schedule a Follow-up Appointment to: Return if symptoms worsen or fail to improve.  If you have any other questions or concerns, please feel free to call the office or send a message through [place]. You may also schedule an earlier appointment if necessary.  Additionally, you may be receiving a survey about your experience at our office within a few days to 1 week by e-mail or mail. We value your feedback.  [name], DO [place]

## 2019-04-09 NOTE — Progress Notes (Signed)
Virtual Visit via Telephone The purpose of this virtual visit is to provide medical care while limiting exposure to the novel coronavirus (COVID19) for both patient and office staff.  Consent was obtained for phone visit:  Yes.   Answered questions that patient had about telehealth interaction:  Yes.   I discussed the limitations, risks, security and privacy concerns of performing an evaluation and management service by telephone. I also discussed with the patient that there may be a patient responsible charge related to this service. The patient expressed understanding and agreed to proceed.  Patient Location: Home Provider Location: [facility])  ---------------------------------------------------------------------- Chief Complaint  Patient presents with  . Rash    S: Reviewed CMA documentation. I have called patient and gathered additional HPI as follows: - Spoke with both patient and granddaughter [name] on other line.  Rash / Tinea - lower extremity Previous history reviewed previously back in 11/2018, he reported 2 years with rash on Right foot on top dorsal area with itching and skin maceration. He was treated initially with a pill for yeast infection once a week for a month, and then it came back. He has taken topical antifungals regularly with limited results. He did improve initially on longer course topical antifungal and oral antifungal, but then back in 12/2018 he requested additional medicine since it returned. Trial on Clotrimazole+Betamethasone with improvement in itching but did not resolve rash - Now today, asking for further treatment now, has not seen dermatology Admits itching Denies any pain or redness spreading, drainage  Denies any high risk travel to areas of current concern for COVID19. Denies any known or suspected exposure to person with or possibly with COVID19.  Denies any fevers, chills, sweats, body ache, cough, shortness of  breath, sinus pain or pressure, headache, abdominal pain, diarrhea  Past Medical History:  Diagnosis Date  . Anxiety   . Glaucoma   . Hypertension   . Urinary incontinence    Social History   Tobacco Use  . Smoking status: Never Smoker  . Smokeless tobacco: Never Used  Substance Use Topics  . Alcohol use: Never  . Drug use: Never    Current Outpatient Medications:  .  aspirin EC 81 MG tablet, Take 81 mg by mouth daily., Disp: , Rfl:  .  bisoprolol-hydrochlorothiazide (ZIAC) 5-6.25 MG tablet, Take 1 tablet by mouth daily., Disp: 90 tablet, Rfl: 1 .  latanoprost (XALATAN) 0.005 % ophthalmic solution, INT 1 GTT IN OU QHS, Disp: , Rfl: 4 .  mirtazapine (REMERON) 15 MG tablet, Take 1 tablet (15 mg total) by mouth at bedtime., Disp: 90 tablet, Rfl: 1 .  tamsulosin (FLOMAX) 0.4 MG CAPS capsule, TK 1 C PO QD 30 MINUTES AFTER THE SAME MEAL, Disp: 90 capsule, Rfl: 3 .  clotrimazole-betamethasone (LOTRISONE) cream, APPLY EXTERNALLY TO THE AFFECTED AREA TWICE DAILY FOR UP TO 1 TO 2 WEEKS, Disp: 45 g, Rfl: 0  Depression screen [facility] 2/9 04/09/2019 11/27/2018  Decreased Interest 0 0  Down, Depressed, Hopeless 0 0  PHQ - 2 Score 0 0  Altered sleeping - 0  Tired, decreased energy - 0  Change in appetite - 0  Feeling bad or failure about yourself  - 0  Trouble concentrating - 0  Moving slowly or fidgety/restless - 0  Suicidal thoughts - 0  PHQ-9 Score - 0    No flowsheet data found.  -------------------------------------------------------------------------- O: No physical exam performed due to remote telephone encounter.  Lab results reviewed.  No results found for this or any previous visit (from the past 2160 hour(s)).  -------------------------------------------------------------------------- A&P:  Problem List Items Addressed This Visit    Tinea corporis - Primary   Relevant Medications   clotrimazole-betamethasone (LOTRISONE) cream   Other Relevant Orders   Ambulatory  referral to Dermatology    Other Visit Diagnoses    Pruritic rash       Relevant Orders   Ambulatory referral to Dermatology     Previous history and exam was concerning for chronic tinea corporis Had mixed results in past with topical and oral antifungal Previously combo therapy clotrimazole-betamethasone has resolved itching but rash can recur still Reorder medicine Referral to Dermatology for consultation and other management  Orders Placed This Encounter  Procedures  . Ambulatory referral to Dermatology    Referral Priority:   Routine    Referral Type:   Consultation    Referral Reason:   Specialty Services Required    Requested Specialty:   Dermatology    Number of Visits Requested:   1     Meds ordered this encounter  Medications  . clotrimazole-betamethasone (LOTRISONE) cream    Sig: APPLY EXTERNALLY TO THE AFFECTED AREA TWICE DAILY FOR UP TO 1 TO 2 WEEKS    Dispense:  45 g    Refill:  0    Follow-up: - Return as needed  Patient verbalizes understanding with the above medical recommendations including the limitation of remote medical advice.  Specific follow-up and call-back criteria were given for patient to follow-up or seek medical care more urgently if needed.   - Time spent in direct consultation with patient on phone: 7 minutes   [name], [facility] 04/09/2019, 4:39 PM

## 2019-05-20 ENCOUNTER — Telehealth: Payer: Self-pay | Admitting: Family Medicine

## 2019-05-20 DIAGNOSIS — R7309 Other abnormal glucose: Secondary | ICD-10-CM

## 2019-05-20 DIAGNOSIS — Z0000 Encounter for general adult medical examination without abnormal findings: Secondary | ICD-10-CM

## 2019-05-20 DIAGNOSIS — F5104 Psychophysiologic insomnia: Secondary | ICD-10-CM

## 2019-05-20 DIAGNOSIS — N289 Disorder of kidney and ureter, unspecified: Secondary | ICD-10-CM

## 2019-05-20 DIAGNOSIS — I10 Essential (primary) hypertension: Secondary | ICD-10-CM

## 2019-05-20 NOTE — Telephone Encounter (Signed)
Signed orders  [name], [facility] 05/20/2019, 5:45 PM

## 2019-05-21 ENCOUNTER — Other Ambulatory Visit: Payer: Self-pay

## 2019-05-21 ENCOUNTER — Other Ambulatory Visit: Payer: Medicare Other

## 2019-05-21 DIAGNOSIS — I10 Essential (primary) hypertension: Secondary | ICD-10-CM | POA: Diagnosis not present

## 2019-05-21 DIAGNOSIS — Z0000 Encounter for general adult medical examination without abnormal findings: Secondary | ICD-10-CM | POA: Diagnosis not present

## 2019-05-21 DIAGNOSIS — R7309 Other abnormal glucose: Secondary | ICD-10-CM | POA: Diagnosis not present

## 2019-05-22 ENCOUNTER — Encounter: Payer: Self-pay | Admitting: Family Medicine

## 2019-05-22 LAB — CBC WITH DIFFERENTIAL/PLATELET
Absolute Monocytes: 611 cells/uL (ref 200–950)
Basophils Absolute: 72 cells/uL (ref 0–200)
Basophils Relative: 1.1 %
Eosinophils Absolute: 507 cells/uL — ABNORMAL HIGH (ref 15–500)
Eosinophils Relative: 7.8 %
HCT: 46.3 % (ref 38.5–50.0)
Hemoglobin: 15 g/dL (ref 13.2–17.1)
Lymphs Abs: 2243 cells/uL (ref 850–3900)
MCH: 27.8 pg (ref 27.0–33.0)
MCHC: 32.4 g/dL (ref 32.0–36.0)
MCV: 85.9 fL (ref 80.0–100.0)
MPV: 13 fL — ABNORMAL HIGH (ref 7.5–12.5)
Monocytes Relative: 9.4 %
Neutro Abs: 3068 cells/uL (ref 1500–7800)
Neutrophils Relative %: 47.2 %
Platelets: 110 10*3/uL — ABNORMAL LOW (ref 140–400)
RBC: 5.39 10*6/uL (ref 4.20–5.80)
RDW: 12.7 % (ref 11.0–15.0)
Total Lymphocyte: 34.5 %
WBC: 6.5 10*3/uL (ref 3.8–10.8)

## 2019-05-22 LAB — COMPLETE METABOLIC PANEL WITH GFR
AG Ratio: 1.4 (calc) (ref 1.0–2.5)
ALT: 11 U/L (ref 9–46)
AST: 19 U/L (ref 10–35)
Albumin: 4.2 g/dL (ref 3.6–5.1)
Alkaline phosphatase (APISO): 61 U/L (ref 35–144)
BUN/Creatinine Ratio: 13 (calc) (ref 6–22)
BUN: 18 mg/dL (ref 7–25)
CO2: 30 mmol/L (ref 20–32)
Calcium: 10 mg/dL (ref 8.6–10.3)
Chloride: 102 mmol/L (ref 98–110)
Creat: 1.34 mg/dL — ABNORMAL HIGH (ref 0.70–1.11)
GFR, Est African American: 54 mL/min/{1.73_m2} — ABNORMAL LOW (ref >=60)
GFR, Est Non African American: 47 mL/min/{1.73_m2} — ABNORMAL LOW (ref >=60)
Globulin: 3 g/dL (calc) (ref 1.9–3.7)
Glucose, Bld: 99 mg/dL (ref 65–99)
Potassium: 3.8 mmol/L (ref 3.5–5.3)
Sodium: 142 mmol/L (ref 135–146)
Total Bilirubin: 0.8 mg/dL (ref 0.2–1.2)
Total Protein: 7.2 g/dL (ref 6.1–8.1)

## 2019-05-22 LAB — HEMOGLOBIN A1C
Hgb A1c MFr Bld: 5.6 % of total Hgb (ref <5.7)
Mean Plasma Glucose: 114 (calc)
eAG (mmol/L): 6.3 (calc)

## 2019-05-22 LAB — LIPID PANEL
Cholesterol: 166 mg/dL (ref <200)
HDL: 45 mg/dL (ref >=40)
LDL Cholesterol (Calc): 102 mg/dL (calc) — ABNORMAL HIGH
Non-HDL Cholesterol (Calc): 121 mg/dL (calc) (ref <130)
Total CHOL/HDL Ratio: 3.7 (calc) (ref <5.0)
Triglycerides: 91 mg/dL (ref <150)

## 2019-05-28 ENCOUNTER — Other Ambulatory Visit: Payer: Self-pay | Admitting: Family Medicine

## 2019-05-28 ENCOUNTER — Encounter: Payer: Self-pay | Admitting: Family Medicine

## 2019-05-28 ENCOUNTER — Other Ambulatory Visit: Payer: Self-pay

## 2019-05-28 ENCOUNTER — Ambulatory Visit (INDEPENDENT_AMBULATORY_CARE_PROVIDER_SITE_OTHER): Payer: Medicare Other | Admitting: Family Medicine

## 2019-05-28 ENCOUNTER — Ambulatory Visit (INDEPENDENT_AMBULATORY_CARE_PROVIDER_SITE_OTHER): Payer: Medicare Other

## 2019-05-28 VITALS — BP 138/65 | HR 78 | Temp 97.5°F | Resp 16 | Ht 72.0 in | Wt 186.6 lb

## 2019-05-28 VITALS — BP 138/65 | HR 78 | Temp 97.5°F | Resp 15 | Ht 72.0 in | Wt 186.6 lb

## 2019-05-28 DIAGNOSIS — N138 Other obstructive and reflux uropathy: Secondary | ICD-10-CM

## 2019-05-28 DIAGNOSIS — E7800 Pure hypercholesterolemia, unspecified: Secondary | ICD-10-CM

## 2019-05-28 DIAGNOSIS — F5101 Primary insomnia: Secondary | ICD-10-CM

## 2019-05-28 DIAGNOSIS — K2100 Gastro-esophageal reflux disease with esophagitis, without bleeding: Secondary | ICD-10-CM | POA: Diagnosis not present

## 2019-05-28 DIAGNOSIS — N2889 Other specified disorders of kidney and ureter: Secondary | ICD-10-CM

## 2019-05-28 DIAGNOSIS — N1831 Chronic kidney disease, stage 3a: Secondary | ICD-10-CM

## 2019-05-28 DIAGNOSIS — I129 Hypertensive chronic kidney disease with stage 1 through stage 4 chronic kidney disease, or unspecified chronic kidney disease: Secondary | ICD-10-CM

## 2019-05-28 DIAGNOSIS — Z0000 Encounter for general adult medical examination without abnormal findings: Secondary | ICD-10-CM

## 2019-05-28 DIAGNOSIS — N183 Chronic kidney disease, stage 3 unspecified: Secondary | ICD-10-CM

## 2019-05-28 DIAGNOSIS — G4700 Insomnia, unspecified: Secondary | ICD-10-CM | POA: Insufficient documentation

## 2019-05-28 DIAGNOSIS — N401 Enlarged prostate with lower urinary tract symptoms: Secondary | ICD-10-CM

## 2019-05-28 MED ORDER — ROSUVASTATIN CALCIUM 5 MG PO TABS: 5.0000 mg | ORAL_TABLET | Freq: Every day | ORAL | 3 refills | Status: DC

## 2019-05-28 NOTE — Assessment & Plan Note (Signed)
Improved control on med management Chronic problem Some sleep cycle issues with sleeping in late morning worsen sleep  Continue current Mirtazapine 15mg  daily, no significant depression associated. Seems to be effective for his sleep onset. Previously ineffective at 7.5mg  dose, would advise keep current dose, future if prefer can taper off but would keep on for now.

## 2019-05-28 NOTE — Patient Instructions (Signed)
Scott Ashley , Thank you for taking time to come for your Medicare Wellness Visit. I appreciate your ongoing commitment to your health goals. Please review the following plan we discussed and let me know if I can assist you in the future.   Screening recommendations/referrals: Colonoscopy: no longer required  Recommended yearly ophthalmology/optometry visit for glaucoma screening and checkup Recommended yearly dental visit for hygiene and checkup  Vaccinations: Influenza vaccine: declined  Pneumococcal vaccine: declined  Tdap vaccine: declined  Shingles vaccine: declined   Covid-19: declined   Advanced directives: Advance directive discussed with you today. I have provided a copy for you to complete at home and have notarized. Once this is complete please bring a copy in to our office so we can scan it into your chart.  Conditions/risks identified: none   Next appointment: Follow up in one year for your annual wellness visit.   Preventive Care 4 Years and Older, Male Preventive care refers to lifestyle choices and visits with your health care provider that can promote health and wellness. What does preventive care include?  A yearly physical exam. This is also called an annual well check.  Dental exams once or twice a year.  Routine eye exams. Ask your health care provider how often you should have your eyes checked.  Personal lifestyle choices, including:  Daily care of your teeth and gums.  Regular physical activity.  Eating a healthy diet.  Avoiding tobacco and drug use.  Limiting alcohol use.  Practicing safe sex.  Taking low doses of aspirin every day.  Taking vitamin and mineral supplements as recommended by your health care provider. What happens during an annual well check? The services and screenings done by your health care provider during your annual well check will depend on your age, overall health, lifestyle risk factors, and family history of  disease. Counseling  Your health care provider may ask you questions about your:  Alcohol use.  Tobacco use.  Drug use.  Emotional well-being.  Home and relationship well-being.  Sexual activity.  Eating habits.  History of falls.  Memory and ability to understand (cognition).  Work and work Statistician. Screening  You may have the following tests or measurements:  Height, weight, and BMI.  Blood pressure.  Lipid and cholesterol levels. These may be checked every 5 years, or more frequently if you are over 36 years old.  Skin check.  Lung cancer screening. You may have this screening every year starting at age 39 if you have a 30-pack-year history of smoking and currently smoke or have quit within the past 15 years.  Fecal occult blood test (FOBT) of the stool. You may have this test every year starting at age 85.  Flexible sigmoidoscopy or colonoscopy. You may have a sigmoidoscopy every 5 years or a colonoscopy every 10 years starting at age 59.  Prostate cancer screening. Recommendations will vary depending on your family history and other risks.  Hepatitis C blood test.  Hepatitis B blood test.  Sexually transmitted disease (STD) testing.  Diabetes screening. This is done by checking your blood sugar (glucose) after you have not eaten for a while (fasting). You may have this done every 1-3 years.  Abdominal aortic aneurysm (AAA) screening. You may need this if you are a current or former smoker.  Osteoporosis. You may be screened starting at age 39 if you are at high risk. Talk with your health care provider about your test results, treatment options, and if necessary, the need  for more tests. Vaccines  Your health care provider may recommend certain vaccines, such as:  Influenza vaccine. This is recommended every year.  Tetanus, diphtheria, and acellular pertussis (Tdap, Td) vaccine. You may need a Td booster every 10 years.  Zoster vaccine. You may  need this after age 56.  Pneumococcal 13-valent conjugate (PCV13) vaccine. One dose is recommended after age 61.  Pneumococcal polysaccharide (PPSV23) vaccine. One dose is recommended after age 67. Talk to your health care provider about which screenings and vaccines you need and how often you need them. This information is not intended to replace advice given to you by your health care provider. Make sure you discuss any questions you have with your health care provider. Document Released: 02/20/2015 Document Revised: 10/14/2015 Document Reviewed: 11/25/2014 Elsevier Interactive Patient Education  2017 Media Prevention in the Home Falls can cause injuries. They can happen to people of all ages. There are many things you can do to make your home safe and to help prevent falls. What can I do on the outside of my home?  Regularly fix the edges of walkways and driveways and fix any cracks.  Remove anything that might make you trip as you walk through a door, such as a raised step or threshold.  Trim any bushes or trees on the path to your home.  Use bright outdoor lighting.  Clear any walking paths of anything that might make someone trip, such as rocks or tools.  Regularly check to see if handrails are loose or broken. Make sure that both sides of any steps have handrails.  Any raised decks and porches should have guardrails on the edges.  Have any leaves, snow, or ice cleared regularly.  Use sand or salt on walking paths during winter.  Clean up any spills in your garage right away. This includes oil or grease spills. What can I do in the bathroom?  Use night lights.  Install grab bars by the toilet and in the tub and shower. Do not use towel bars as grab bars.  Use non-skid mats or decals in the tub or shower.  If you need to sit down in the shower, use a plastic, non-slip stool.  Keep the floor dry. Clean up any water that spills on the floor as soon as it  happens.  Remove soap buildup in the tub or shower regularly.  Attach bath mats securely with double-sided non-slip rug tape.  Do not have throw rugs and other things on the floor that can make you trip. What can I do in the bedroom?  Use night lights.  Make sure that you have a light by your bed that is easy to reach.  Do not use any sheets or blankets that are too big for your bed. They should not hang down onto the floor.  Have a firm chair that has side arms. You can use this for support while you get dressed.  Do not have throw rugs and other things on the floor that can make you trip. What can I do in the kitchen?  Clean up any spills right away.  Avoid walking on wet floors.  Keep items that you use a lot in easy-to-reach places.  If you need to reach something above you, use a strong step stool that has a grab bar.  Keep electrical cords out of the way.  Do not use floor polish or wax that makes floors slippery. If you must use wax, use  non-skid floor wax.  Do not have throw rugs and other things on the floor that can make you trip. What can I do with my stairs?  Do not leave any items on the stairs.  Make sure that there are handrails on both sides of the stairs and use them. Fix handrails that are broken or loose. Make sure that handrails are as long as the stairways.  Check any carpeting to make sure that it is firmly attached to the stairs. Fix any carpet that is loose or worn.  Avoid having throw rugs at the top or bottom of the stairs. If you do have throw rugs, attach them to the floor with carpet tape.  Make sure that you have a light switch at the top of the stairs and the bottom of the stairs. If you do not have them, ask someone to add them for you. What else can I do to help prevent falls?  Wear shoes that:  Do not have high heels.  Have rubber bottoms.  Are comfortable and fit you well.  Are closed at the toe. Do not wear sandals.  If you  use a stepladder:  Make sure that it is fully opened. Do not climb a closed stepladder.  Make sure that both sides of the stepladder are locked into place.  Ask someone to hold it for you, if possible.  Clearly mark and make sure that you can see:  Any grab bars or handrails.  First and last steps.  Where the edge of each step is.  Use tools that help you move around (mobility aids) if they are needed. These include:  Canes.  Walkers.  Scooters.  Crutches.  Turn on the lights when you go into a dark area. Replace any light bulbs as soon as they burn out.  Set up your furniture so you have a clear path. Avoid moving your furniture around.  If any of your floors are uneven, fix them.  If there are any pets around you, be aware of where they are.  Review your medicines with your doctor. Some medicines can make you feel dizzy. This can increase your chance of falling. Ask your doctor what other things that you can do to help prevent falls. This information is not intended to replace advice given to you by your health care provider. Make sure you discuss any questions you have with your health care provider. Document Released: 11/20/2008 Document Revised: 07/02/2015 Document Reviewed: 02/28/2014 Elsevier Interactive Patient Education  2017 [employer].

## 2019-05-28 NOTE — Progress Notes (Signed)
Subjective:    Patient ID: Scott Ashley, male    DOB: 1929-08-18, 86 y.o.   MRN: [id number]  Scott Ashley is a 86 y.o. male presenting on 05/28/2019 for Annual Exam  Here for Annual Physical and Lab Review.  Already seen [facility] today for AMW.  HPI   CHRONIC HTN with CKD-III / History of Left Renal Mass Reports no new concerns. Checking BP regularly. He is drinking plenty of water. He does not take NSAIDs. Takes Tylenol PRN. He is followed by Dr Diamantina Providence at [facility] for L Renal Mass, on imaging they have monitored this, and it is small and on surveillance. They will repeat renal US in 1 year. Current Meds - Bisoprolol-HCTZ 5-6.25mg    Reports good compliance, took meds today. Tolerating well, w/o complaints. Denies CP, dyspnea, HA, edema, dizziness / lightheadedness  History of H Pylori Gastric / Abnormal bowel habits - He was on dual antibiotic therapy, sensitive stomach from antibiotics. Has improved - S/p Colonoscopy 10/2017 [facility]  HYPERLIPIDEMIA: - Reports no concerns. Last lipid panel 05/2019, controlled except mild LDL at 102 On ASA 81mg  daily for primary prevention. Never on Statin.  BPH LUTS / Nocturia Followed by Urology [facility] Dr Diamantina Providence, last seen 12/2018 He is taking OTC Prostagenix and Tamsulosin 0.4mg  daily - good results overall. Occasional nocturia still maybe 1-3 x, overall improved.  Insomnia Reports chronic problem, with some difficulty falling asleep at times. He can occasionally sleep in late and then has difficulty falling asleep at night sometimes. Now improved. - He takes Mirtazapine 15mg  nightly (previously was on 7.5mg )   Health Maintenance: Patient declines COVID19 vaccine at this time. Counseling provided. Recommended that he proceed with Pfizer or Moderna if changes mind.  Depression screen [facility] 2/9 05/28/2019 04/09/2019 11/27/2018  Decreased Interest 0 0 0  Down, Depressed, Hopeless 0 0 0  PHQ - 2 Score 0 0 0  Altered sleeping - - 0    Tired, decreased energy - - 0  Change in appetite - - 0  Feeling bad or failure about yourself  - - 0  Trouble concentrating - - 0  Moving slowly or fidgety/restless - - 0  Suicidal thoughts - - 0  PHQ-9 Score - - 0    Past Medical History:  Diagnosis Date  . Anxiety   . Glaucoma   . Hypertension   . Urinary incontinence    Past Surgical History:  Procedure Laterality Date  . COLONOSCOPY WITH PROPOFOL N/A 10/10/2017   Procedure: COLONOSCOPY WITH PROPOFOL;  Surgeon: Lucilla Lame, MD;  Location[facility] ENDOSCOPY;  Service: Endoscopy;  Laterality: N/A;  . ESOPHAGOGASTRODUODENOSCOPY (EGD) WITH PROPOFOL N/A 10/10/2017   Procedure: ESOPHAGOGASTRODUODENOSCOPY (EGD) WITH PROPOFOL;  Surgeon: Lucilla Lame, MD;  Location: [facility] ENDOSCOPY;  Service: Endoscopy;  Laterality: N/A;  . HERNIA REPAIR     twice  . TONSILLECTOMY     Social History   Socioeconomic History  . Marital status: Widowed    Spouse name: Not on file  . Number of children: Not on file  . Years of education: [employer]  . Highest education level: High school graduate  Occupational History  . Not on file  Tobacco Use  . Smoking status: Never Smoker  . Smokeless tobacco: Never Used  Substance and Sexual Activity  . Alcohol use: Never  . Drug use: Never  . Sexual activity: Not Currently    Birth control/protection: None  Other Topics Concern  . Not on file  Social  History Narrative  . Not on file   Social Determinants of Health   Financial Resource Strain: Low Risk   . Difficulty of Paying Living Expenses: Not hard at all  Food Insecurity: No Food Insecurity  . Worried About Charity fundraiser in the Last Year: Never true  . Ran Out of Food in the Last Year: Never true  Transportation Needs: No Transportation Needs  . Lack of Transportation (Medical): No  . Lack of Transportation (Non-Medical): No  Physical Activity: Sufficiently Active  . Days of Exercise per Week: 5 days  . Minutes of Exercise per Session:  30 min  Stress:   . Feeling of Stress :   Social Connections: Moderately Isolated  . Frequency of Communication with Friends and Family: More than three times a week  . Frequency of Social Gatherings with Friends and Family: More than three times a week  . Attends Religious Services: Never  . Active Member of Clubs or Organizations: No  . Attends Archivist Meetings: Never  . Marital Status: Widowed  Intimate Partner Violence:   . Fear of Current or Ex-Partner:   . Emotionally Abused:   Marland Kitchen Physically Abused:   . Sexually Abused:    Family History  Problem Relation Age of Onset  . ALS Mother    Current Outpatient Medications on File Prior to Visit  Medication Sig  . acetaminophen (TYLENOL) 500 MG tablet Take 500 mg by mouth every 6 (six) hours as needed.  . bisoprolol-hydrochlorothiazide (ZIAC) 5-6.25 MG tablet Take 1 tablet by mouth daily.  Marland Kitchen latanoprost (XALATAN) 0.005 % ophthalmic solution INT 1 GTT IN OU QHS  . mirtazapine (REMERON) 15 MG tablet Take 1 tablet (15 mg total) by mouth at bedtime.  Marland Kitchen Specialty Vitamins Products (PROSTATE PO) Take by mouth.  . tamsulosin (FLOMAX) 0.4 MG CAPS capsule TK 1 C PO QD 30 MINUTES AFTER THE SAME MEAL   No current facility-administered medications on file prior to visit.    Review of Systems  Constitutional: Negative for activity change, appetite change, chills, diaphoresis, fatigue and fever.  HENT: Negative for congestion and hearing loss.   Eyes: Negative for visual disturbance.  Respiratory: Negative for apnea, cough, chest tightness, shortness of breath and wheezing.   Cardiovascular: Negative for chest pain, palpitations and leg swelling.  Gastrointestinal: Negative for abdominal pain, anal bleeding, blood in stool, constipation, diarrhea, nausea and vomiting.  Endocrine: Negative for cold intolerance.  Genitourinary: Positive for difficulty urinating (mild occasional). Negative for dysuria, frequency and hematuria.        Nocturia   Musculoskeletal: Negative for arthralgias and neck pain.  Skin: Negative for rash.  Allergic/Immunologic: Negative for environmental allergies.  Neurological: Negative for dizziness, weakness, light-headedness, numbness and headaches.  Hematological: Negative for adenopathy.  Psychiatric/Behavioral: Negative for behavioral problems, dysphoric mood and sleep disturbance.   Per HPI unless specifically indicated above      Objective:    BP 138/65   Pulse 78   Temp (!) 97.5 F (36.4 C)   Resp 16   Ht 6' (1.829 m)   Wt 186 lb 9.6 oz (84.6 kg)   BMI 25.31 kg/m   Wt Readings from Last 3 Encounters:  05/28/19 186 lb 9.6 oz (84.6 kg)  05/28/19 186 lb 9.6 oz (84.6 kg)  12/17/18 185 lb 4.8 oz (84.1 kg)    Physical Exam Vitals and nursing note reviewed.  Constitutional:      General: He is not in acute distress.  Appearance: He is well-developed. He is not diaphoretic.     Comments: Well-appearing elderly 86 year old male, comfortable, cooperative  HENT:     Head: Normocephalic and atraumatic.     Comments: Hard of hearing, has hearing aids in. Able to follow most of conversation Eyes:     General:        Right eye: No discharge.        Left eye: No discharge.     Conjunctiva/sclera: Conjunctivae normal.     Pupils: Pupils are equal, round, and reactive to light.  Neck:     Thyroid: No thyromegaly.     Vascular: No carotid bruit.  Cardiovascular:     Rate and Rhythm: Normal rate and regular rhythm.     Heart sounds: Normal heart sounds. No murmur.  Pulmonary:     Effort: Pulmonary effort is normal. No respiratory distress.     Breath sounds: Normal breath sounds. No wheezing or rales.  Abdominal:     General: Bowel sounds are normal. There is no distension.     Palpations: Abdomen is soft. There is no mass.     Tenderness: There is no abdominal tenderness.  Musculoskeletal:        General: No tenderness. Normal range of motion.     Cervical back: Normal  range of motion and neck supple.     Comments: Upper / Lower Extremities: - Normal muscle tone, strength bilateral upper extremities 5/5, lower extremities 5/5  Lymphadenopathy:     Cervical: No cervical adenopathy.  Skin:    General: Skin is warm and dry.     Findings: No erythema or rash.  Neurological:     Mental Status: He is alert and oriented to person, place, and time.     Comments: Distal sensation intact to light touch all extremities  Psychiatric:        Behavior: Behavior normal.     Comments: Well groomed, good eye contact, normal speech and thoughts        Results for orders placed or performed in visit on 05/20/19  COMPLETE METABOLIC PANEL WITH GFR  Result Value Ref Range   Glucose, Bld 99 65 - 99 mg/dL   BUN 18 7 - 25 mg/dL   Creat 1.34 (H) 0.70 - 1.11 mg/dL   GFR, Est Non African American 47 (L) > OR = 60 mL/min/1.37m2   GFR, Est African American 54 (L) > OR = 60 mL/min/1.19m2   BUN/Creatinine Ratio 13 6 - 22 (calc)   Sodium 142 135 - 146 mmol/L   Potassium 3.8 3.5 - 5.3 mmol/L   Chloride 102 98 - 110 mmol/L   CO2 30 20 - 32 mmol/L   Calcium 10.0 8.6 - 10.3 mg/dL   Total Protein 7.2 6.1 - 8.1 g/dL   Albumin 4.2 3.6 - 5.1 g/dL   Globulin 3.0 1.9 - 3.7 g/dL (calc)   AG Ratio 1.4 1.0 - 2.5 (calc)   Total Bilirubin 0.8 0.2 - 1.2 mg/dL   Alkaline phosphatase (APISO) 61 35 - 144 U/L   AST 19 10 - 35 U/L   ALT 11 9 - 46 U/L  CBC with Differential/Platelet  Result Value Ref Range   WBC 6.5 3.8 - 10.8 Thousand/uL   RBC 5.39 4.20 - 5.80 Million/uL   Hemoglobin 15.0 13.2 - 17.1 g/dL   HCT 46.3 38.5 - 50.0 %   MCV 85.9 80.0 - 100.0 fL   MCH 27.8 27.0 - 33.0 pg  MCHC 32.4 32.0 - 36.0 g/dL   RDW 12.7 11.0 - 15.0 %   Platelets 110 (L) 140 - 400 Thousand/uL   MPV 13.0 (H) 7.5 - 12.5 fL   Neutro Abs 3,068 1,500 - 7,800 cells/uL   Lymphs Abs 2,243 850 - 3,900 cells/uL   Absolute Monocytes 611 200 - 950 cells/uL   Eosinophils Absolute 507 (H) 15 - 500 cells/uL    Basophils Absolute 72 0 - 200 cells/uL   Neutrophils Relative % 47.2 %   Total Lymphocyte 34.5 %   Monocytes Relative 9.4 %   Eosinophils Relative 7.8 %   Basophils Relative 1.1 %  Lipid panel  Result Value Ref Range   Cholesterol 166 <200 mg/dL   HDL 45 > OR = 40 mg/dL   Triglycerides 91 <150 mg/dL   LDL Cholesterol (Calc) 102 (H) mg/dL (calc)   Total CHOL/HDL Ratio 3.7 <5.0 (calc)   Non-HDL Cholesterol (Calc) 121 <130 mg/dL (calc)  Hemoglobin A1c  Result Value Ref Range   Hgb A1c MFr Bld 5.6 <5.7 % of total Hgb   Mean Plasma Glucose 114 (calc)   eAG (mmol/L) 6.3 (calc)      Assessment & Plan:   Problem List Items Addressed This Visit    Renal mass, left    Followed by Urology Surveillance currently next ultrasound renal in 1 year as planned.      Pure hypercholesterolemia    Controlled cholesterol on lifestyle, no prior statin Last lipid panel 05/2019- mild LDL at 102 Calculated ASCVD 10 yr risk score elevated. Age 21  Plan: 1. STOP ASA 81mg  due to age 37 inc bruising and inc risk of bleeding discussed today. 2. START low dose statin Rosuvastatin 5mg  nightly for ASCVD primary risk reduction 3. Encourage improved lifestyle - low carb/cholesterol, reduce portion size, continue improving regular exercise  Check CMET LFT Lipids in 3 months after statin use      Relevant Medications   rosuvastatin (CRESTOR) 5 MG tablet   Insomnia    Improved control on med management Chronic problem Some sleep cycle issues with sleeping in late morning worsen sleep  Continue current Mirtazapine 15mg  daily, no significant depression associated. Seems to be effective for his sleep onset. Previously ineffective at 7.5mg  dose, would advise keep current dose, future if prefer can taper off but would keep on for now.      Gastro-esophageal reflux disease with esophagitis    Stable, currently controlled. History of H Pylori      CKD (chronic kidney disease), stage III    See A&P HTN        BPH with obstruction/lower urinary tract symptoms    Improved. Mostly controlled. Some nocturia Followed by [facility] Urology Dr Diamantina Providence On Flomax 0.4mg  daily and prostate supplement OTC       Benign hypertension with CKD (chronic kidney disease) stage III    Well-controlled HTN Complication with CKD-III    Plan:  1. Continue current BP regimen - Bisoprolol-HCTZ 5-6.25mg  2. Encourage improved lifestyle - low sodium diet, regular exercise 3. Continue monitor BP outside office, bring readings to next visit, if persistently >140/90 or new symptoms notify office sooner  Check lab Chemistry in 3 months, then q 6 mo      Relevant Medications   rosuvastatin (CRESTOR) 5 MG tablet    Other Visit Diagnoses    Annual physical exam    -  Primary       Updated Health Maintenance information Reviewed recent lab results  with patient Encouraged improvement to lifestyle with diet and exercise Maintain weight.   Meds ordered this encounter  Medications  . rosuvastatin (CRESTOR) 5 MG tablet    Sig: Take 1 tablet (5 mg total) by mouth at bedtime.    Dispense:  90 tablet    Refill:  3     Follow up plan: Return in about 3 months (around 08/27/2019) for 3 month fasting lab only - next visit can still be 6 month follow-up.   Future labs ordered for 08/27/19 - CMET + Lipid monitor Cr / LFTs and Lipids on new statin.  Scott Ashley, [facility] 05/28/2019, 2:18 PM

## 2019-05-28 NOTE — Assessment & Plan Note (Signed)
Followed by Urology Surveillance currently next ultrasound renal in 1 year as planned.

## 2019-05-28 NOTE — Progress Notes (Signed)
Subjective:   Scott Ashley is a 86 y.o. male who presents for Medicare Annual/Subsequent preventive examination.  Review of Systems:   Cardiac Risk Factors include: advanced age (>32men, >81 women);male gender;hypertension;dyslipidemia     Objective:    Vitals: BP 138/65 (BP Location: Left Arm, Patient Position: Sitting, Cuff Size: Normal)   Pulse 78   Temp (!) 97.5 F (36.4 C) (Temporal)   Resp 15   Ht 6' (1.829 m)   Wt 186 lb 9.6 oz (84.6 kg)   SpO2 99%   BMI 25.31 kg/m   Body mass index is 25.31 kg/m.  Advanced Directives 05/28/2019 11/13/2017 10/10/2017  Does Patient Have a Medical Advance Directive? Yes Yes Yes  Type of Advance Directive Living will;Healthcare Power of Attorney Living will [place] in Chart? No - copy requested - No - copy requested    Tobacco Social History   Tobacco Use  Smoking Status Never Smoker  Smokeless Tobacco Never Used     Counseling given: Not Answered   Clinical Intake:  Pre-visit preparation completed: Yes  Pain : No/denies pain     Nutritional Status: BMI 25 -29 Overweight Nutritional Risks: None Diabetes: No  How often do you need to have someone help you when you read instructions, pamphlets, or other written materials from your doctor or pharmacy?: 1 - Never     Information entered by :: Bertram Haddix,LPN  Past Medical History:  Diagnosis Date  . Anxiety   . Glaucoma   . Hypertension   . Urinary incontinence    Past Surgical History:  Procedure Laterality Date  . COLONOSCOPY WITH PROPOFOL N/A 10/10/2017   Procedure: COLONOSCOPY WITH PROPOFOL;  Surgeon: Lucilla Lame, MD;  Location: [facility] ENDOSCOPY;  Service: Endoscopy;  Laterality: N/A;  . ESOPHAGOGASTRODUODENOSCOPY (EGD) WITH PROPOFOL N/A 10/10/2017   Procedure: ESOPHAGOGASTRODUODENOSCOPY (EGD) WITH PROPOFOL;  Surgeon: Lucilla Lame, MD;  Location: [facility] ENDOSCOPY;  Service: Endoscopy;  Laterality: N/A;  .  HERNIA REPAIR     twice  . TONSILLECTOMY     Family History  Problem Relation Age of Onset  . ALS Mother    Social History   Socioeconomic History  . Marital status: Widowed    Spouse name: Not on file  . Number of children: Not on file  . Years of education: [employer]  . Highest education level: High school graduate  Occupational History  . Not on file  Tobacco Use  . Smoking status: Never Smoker  . Smokeless tobacco: Never Used  Substance and Sexual Activity  . Alcohol use: Never  . Drug use: Never  . Sexual activity: Not Currently    Birth control/protection: None  Other Topics Concern  . Not on file  Social History Narrative  . Not on file   Social Determinants of Health   Financial Resource Strain: Low Risk   . Difficulty of Paying Living Expenses: Not hard at all  Food Insecurity: No Food Insecurity  . Worried About Charity fundraiser in the Last Year: Never true  . Ran Out of Food in the Last Year: Never true  Transportation Needs: No Transportation Needs  . Lack of Transportation (Medical): No  . Lack of Transportation (Non-Medical): No  Physical Activity: Sufficiently Active  . Days of Exercise per Week: 5 days  . Minutes of Exercise per Session: 30 min  Stress:   . Feeling of Stress :   Social Connections: Moderately Isolated  .  Frequency of Communication with Friends and Family: More than three times a week  . Frequency of Social Gatherings with Friends and Family: More than three times a week  . Attends Religious Services: Never  . Active Member of Clubs or Organizations: No  . Attends Archivist Meetings: Never  . Marital Status: Widowed    Outpatient Encounter Medications as of 05/28/2019  Medication Sig  . aspirin EC 81 MG tablet Take 81 mg by mouth daily.  . bisoprolol-hydrochlorothiazide (ZIAC) 5-6.25 MG tablet Take 1 tablet by mouth daily.  Marland Kitchen latanoprost (XALATAN) 0.005 % ophthalmic solution INT 1 GTT IN OU QHS  . mirtazapine  (REMERON) 15 MG tablet Take 1 tablet (15 mg total) by mouth at bedtime.  . tamsulosin (FLOMAX) 0.4 MG CAPS capsule TK 1 C PO QD 30 MINUTES AFTER THE SAME MEAL  . [DISCONTINUED] clotrimazole-betamethasone (LOTRISONE) cream APPLY EXTERNALLY TO THE AFFECTED AREA TWICE DAILY FOR UP TO 1 TO 2 WEEKS (Patient not taking: Reported on 05/28/2019)   No facility-administered encounter medications on file as of 05/28/2019.    Activities of Daily Living In your present state of health, do you have any difficulty performing the following activities: 05/28/2019 11/27/2018  Hearing? Y Y  Comment left hearing aid -  Vision? N N  Comment Dr.Woodard, glaucoma. -  Difficulty concentrating or making decisions? N N  Walking or climbing stairs? N N  Dressing or bathing? N N  Doing errands, shopping? Y N  Comment family helps -  Conservation officer, nature and eating ? N -  Using the Toilet? N -  In the past six months, have you accidently leaked urine? N -  Do you have problems with loss of bowel control? N -  Managing your Medications? N -  Managing your Finances? N -  Housekeeping or managing your Housekeeping? N -  Some recent data might be hidden    Patient Care Team: Olin Hauser, DO as PCP - General (Family Medicine)   Assessment:   This is a routine wellness examination for [name].  Exercise Activities and Dietary recommendations Current Exercise Habits: The patient does not participate in regular exercise at present(outside work), Exercise limited by: None identified  Goals Addressed   None     Fall Risk: Fall Risk  04/09/2019 11/27/2018  Falls in the past year? 0 0  Number falls in past yr: 0 -  Injury with Fall? 0 -  Follow up Falls evaluation completed -    FALL RISK PREVENTION PERTAINING TO THE HOME:  Any stairs in or around the home? Yes  If so, are there any without handrails? No   Home free of loose throw rugs in walkways, pet beds, electrical cords, etc? Yes  Adequate  lighting in your home to reduce risk of falls? Yes   ASSISTIVE DEVICES UTILIZED TO PREVENT FALLS:  Life alert? No  Use of a cane, walker or w/c? No  Grab bars in the bathroom? No  Shower chair or bench in shower? No  Elevated toilet seat or a handicapped toilet? Yes   TIMED UP AND GO:  Was the test performed? Yes .  Length of time to ambulate 10 feet: 9 sec.   GAIT:  Appearance of gait: Gait slow and steady without the use of an assistive device.  Education: Fall risk prevention has been discussed.  Intervention(s) required? No  DME/home health order needed?  No   Depression Screen PHQ 2/9 Scores 05/28/2019 04/09/2019 11/27/2018  PHQ -  2 Score 0 0 0  PHQ- 9 Score - - 0    Cognitive Function     6CIT Screen 05/28/2019  What Year? 0 points  What month? 0 points  What time? 0 points  Count back from 20 2 points  Months in reverse 0 points  Repeat phrase 0 points  Total Score 2     There is no immunization history on file for this patient.  Qualifies for Shingles Vaccine?declined   Tdap: patient believes he has had one.   Flu Vaccine: declined   Pneumococcal Vaccine: declined   Covid-19 Vaccine: declined   Screening Tests Health Maintenance  Topic Date Due  . COVID-19 Vaccine (1) 06/13/2019 (Originally 03/31/1945)  . TETANUS/TDAP  05/27/2020 (Originally 03/30/1948)  . PNA vac Low Risk Adult (1 of 2 - PCV13) 05/27/2020 (Originally 03/30/1996)  . INFLUENZA VACCINE  09/08/2019   Cancer Screenings:  Colorectal Screening: not indicated   Lung Cancer Screening: (Low Dose CT Chest recommended if Age 1-80 years, 30 pack-year currently smoking OR have quit w/in 15years.) does not qualify.     Additional Screening:  Hepatitis C Screening: does not qualify  Vision Screening: Recommended annual ophthalmology exams for early detection of glaucoma and other disorders of the eye. Is the patient up to date with their annual eye exam?  Yes  Who is the provider or what  is the name of the office in which the pt attends annual eye exams? Dr.Woodard   Dental Screening: Recommended annual dental exams for proper oral hygiene  Community Resource Referral:  CRR required this visit?  No        Plan:  I have personally reviewed and addressed the Medicare Annual Wellness questionnaire and have noted the following in the patient's chart:  A. Medical and social history B. Use of alcohol, tobacco or illicit drugs  C. Current medications and supplements D. Functional ability and status E.  Nutritional status F.  Physical activity G. Advance directives H. List of other physicians I.  Hospitalizations, surgeries, and ER visits in previous 12 months J.  [place] such as hearing and vision if needed, cognitive and depression L. Referrals and appointments   In addition, I have reviewed and discussed with patient certain preventive protocols, quality metrics, and best practice recommendations. A written personalized care plan for preventive services as well as general preventive health recommendations were provided to patient.   Signed,   Bevelyn Ngo, LPN  [id number] Nurse Health Advisor   Nurse Notes: none

## 2019-05-28 NOTE — Assessment & Plan Note (Signed)
Controlled cholesterol on lifestyle, no prior statin Last lipid panel 05/2019- mild LDL at 102 Calculated ASCVD 10 yr risk score elevated. Age 86  Plan: 1. STOP ASA 81mg  due to age 48 inc bruising and inc risk of bleeding discussed today. 2. START low dose statin Rosuvastatin 5mg  nightly for ASCVD primary risk reduction 3. Encourage improved lifestyle - low carb/cholesterol, reduce portion size, continue improving regular exercise  Check CMET LFT Lipids in 3 months after statin use

## 2019-05-28 NOTE — Assessment & Plan Note (Signed)
See A&P HTN 

## 2019-05-28 NOTE — Assessment & Plan Note (Signed)
Stable, currently controlled. History of H Pylori

## 2019-05-28 NOTE — Assessment & Plan Note (Addendum)
Well-controlled HTN Complication with CKD-III    Plan:  1. Continue current BP regimen - Bisoprolol-HCTZ 5-6.25mg  2. Encourage improved lifestyle - low sodium diet, regular exercise 3. Continue monitor BP outside office, bring readings to next visit, if persistently >140/90 or new symptoms notify office sooner  Check lab Chemistry in 3 months, then q 6 mo

## 2019-05-28 NOTE — Patient Instructions (Addendum)
Thank you for coming to the office today.  1. Chemistry - Elevated Creatinine up to 1.34 (GFR 47) previously range 1 to 1.16. Electrolytes and liver function normal.  2. Hemoglobin A1c (Diabetes screening) - 5.6, normal not in range of Pre-Diabetes (>5.7 to 6.4)   3. Cholesterol - Normal cholesterol results. Including HDL (good cholesterol), borderline elevated at 102 LDL (bad cholesterol), and Triglycerides. Consider switching ASA to Statin for ASCVD risk reduction.  4. CBC Blood Counts - Mild low platelets previously 116 to 131, down to 110 now. No anemia  -------------------------  STOP Baby Aspirin 81mg .  You are at increased risk of future Cardiovascular complications such as Heart Attack or Stroke from an artery blockage due to abnormal cholesterol and/or risk factors. - As discussed, Statin Cholesterol pills both can both LOWER cholesterol and REDUCE this future risk of heart attack and stroke - Start Rosuvastatin (generic Crestor) 5mg  pill once at bedtime every night  If you develop mild aches or pains in muscle or joint that does NOT improve or go away after first 3-4 weeks then this may require [place] to adjust the dose. First I would recommend STOPPING the medication for a few weeks until your ache and pain symptoms completely RESOLVE. Then you can restart at a LOWER DOSE either HALF a pill at bedtime every night or LESS OFTEN such as one pill a week only and then gradually increase to every other day or max dose of 3 times a week  Lastly, sometimes we need to try other versions of this medicine to find one that works for you and does not cause side effects.   Please schedule a Follow-up Appointment to: Return in about 3 months (around 08/27/2019) for 3 month fasting lab only - next visit can still be 6 month follow-up.  If you have any other questions or concerns, please feel free to call the office or send a message through [place]. You may also schedule an earlier appointment if  necessary.  Additionally, you may be receiving a survey about your experience at our office within a few days to 1 week by e-mail or mail. We value your feedback.  [name], DO [place]

## 2019-05-28 NOTE — Assessment & Plan Note (Signed)
Improved. Mostly controlled. Some nocturia Followed by [facility] Dr Diamantina [place] On Flomax 0.4mg  daily and prostate supplement OTC

## 2019-06-12 ENCOUNTER — Other Ambulatory Visit: Payer: Self-pay

## 2019-06-12 ENCOUNTER — Ambulatory Visit: Payer: Medicare Other | Admitting: Dermatology

## 2019-06-12 DIAGNOSIS — B354 Tinea corporis: Secondary | ICD-10-CM | POA: Diagnosis not present

## 2019-06-12 MED ORDER — TERBINAFINE HCL 250 MG PO TABS: ORAL_TABLET | ORAL | 0 refills | Status: DC

## 2019-06-12 NOTE — Progress Notes (Signed)
   New Patient Visit  Subjective  Scott Ashley is a 86 y.o. male who presents for the following: Rash (He was treated initially with a pill for yeast infection once a week for a month, and then it came back. He has taken topical antifungals regularly with limited results. He did improve initially on longer course topical antifungal and 2 weeks of oral antifungal, but then back in 12/2018 he requested additional medicine since it returned. Trial on Clotrimazole+Betamethasone with improvement in itching but did not resolve rash)  The following portions of the chart were reviewed this encounter and updated as appropriate:  Tobacco  Allergies  Meds  Problems  Med Hx  Surg Hx  Fam Hx     Review of Systems:  No other skin or systemic complaints except as noted in HPI or Assessment and Plan.  Objective  Well appearing patient in no apparent distress; mood and affect are within normal limits.  A focused examination was performed including the B/L lower leg. Relevant physical exam findings are noted in the Assessment and Plan.  Objective  L buttocks: Pink scaly eruption covering entire buttocks with raised scaly serpiginous border. Scaly pink plaques on the B/L lower legs    Assessment & Plan  Tinea corporis severe and extensive Buttocks and trunk Previous oral antifungal course may not have been long enough. And Tinea corporis Vs eczema on the legs -  No history of liver disease. Start Lamisil 250mg  po QD - plan 3 months of treatment.  D/C Crestor while on treatment.  Recheck in one month.  terbinafine (LAMISIL) 250 MG tablet - L buttocks  Return in about 1 month (around 07/13/2019).  Luther Redo, CMA, am acting as scribe for Sarina Ser, MD .  Documentation: I have reviewed the above documentation for accuracy and completeness, and I agree with the above.  Sarina Ser, MD

## 2019-06-14 ENCOUNTER — Encounter: Payer: Self-pay | Admitting: Dermatology

## 2019-07-10 ENCOUNTER — Other Ambulatory Visit: Payer: Self-pay | Admitting: Dermatology

## 2019-07-10 DIAGNOSIS — B354 Tinea corporis: Secondary | ICD-10-CM

## 2019-07-18 DIAGNOSIS — H401131 Primary open-angle glaucoma, bilateral, mild stage: Secondary | ICD-10-CM | POA: Diagnosis not present

## 2019-07-18 DIAGNOSIS — H2513 Age-related nuclear cataract, bilateral: Secondary | ICD-10-CM | POA: Diagnosis not present

## 2019-07-25 ENCOUNTER — Ambulatory Visit: Payer: Medicare Other | Admitting: Dermatology

## 2019-07-25 ENCOUNTER — Other Ambulatory Visit: Payer: Self-pay

## 2019-07-25 DIAGNOSIS — B354 Tinea corporis: Secondary | ICD-10-CM | POA: Diagnosis not present

## 2019-07-25 DIAGNOSIS — B351 Tinea unguium: Secondary | ICD-10-CM

## 2019-07-25 DIAGNOSIS — B353 Tinea pedis: Secondary | ICD-10-CM | POA: Diagnosis not present

## 2019-07-25 MED ORDER — TERBINAFINE HCL 250 MG PO TABS: ORAL_TABLET | ORAL | 1 refills | Status: DC

## 2019-07-25 NOTE — Progress Notes (Signed)
   Follow-Up Visit   Subjective  Scott Ashley is a 86 y.o. male who presents for the following: Follow-up.  Patient here today for 1 month follow up for severe Tinea Corporis of the trunk, legs and buttocks. Patient took 1 month of terbinafine 250mg  with no problems. He has been using Cortisone 10 occasionally as needed.   The following portions of the chart were reviewed this encounter and updated as appropriate:  Tobacco  Allergies  Meds  Problems  Med Hx  Surg Hx  Fam Hx      Review of Systems:  No other skin or systemic complaints except as noted in HPI or Assessment and Plan.  Objective  Well appearing patient in no apparent distress; mood and affect are within normal limits.  A focused examination was performed including buttocks. Relevant physical exam findings are noted in the Assessment and Plan.  Objective  buttocks: Pink scaly eruption with raised scaly serpiginous border   Assessment & Plan  Tinea corporis; tinea pedis; tinea unguium -severe-currently on systemic medications.  He is having no side effects. Buttocks; feet; nails  Continue terbinafine 250mg  1 PO QD #30 1RF  Patient advised to stay off of cholesterol medications while taking terbinafine.  Patient has no liver problems.  terbinafine (LAMISIL) 250 MG tablet - buttocks  Return January, for tinea corporis.  Graciella Belton, RMA, am acting as scribe for Sarina Ser, MD . Documentation: I have reviewed the above documentation for accuracy and completeness, and I agree with the above.  Sarina Ser, MD

## 2019-07-25 NOTE — Patient Instructions (Signed)
Do not restart cholesterol medications until course of terbinafine is completed.

## 2019-07-28 ENCOUNTER — Encounter: Payer: Self-pay | Admitting: Dermatology

## 2019-08-22 ENCOUNTER — Other Ambulatory Visit: Payer: Self-pay | Admitting: Family Medicine

## 2019-08-22 DIAGNOSIS — F5104 Psychophysiologic insomnia: Secondary | ICD-10-CM

## 2019-08-22 DIAGNOSIS — I10 Essential (primary) hypertension: Secondary | ICD-10-CM

## 2019-08-27 ENCOUNTER — Other Ambulatory Visit: Payer: Medicare Other

## 2019-08-30 DIAGNOSIS — K2100 Gastro-esophageal reflux disease with esophagitis, without bleeding: Secondary | ICD-10-CM

## 2019-09-02 MED ORDER — OMEPRAZOLE 20 MG PO CPDR: 20.0000 mg | DELAYED_RELEASE_CAPSULE | Freq: Every day | ORAL | 3 refills | Status: DC

## 2019-09-18 ENCOUNTER — Encounter: Payer: Self-pay | Admitting: Dermatology

## 2019-09-19 ENCOUNTER — Other Ambulatory Visit: Payer: Self-pay

## 2019-09-19 ENCOUNTER — Telehealth: Payer: Self-pay

## 2019-09-19 DIAGNOSIS — B354 Tinea corporis: Secondary | ICD-10-CM

## 2019-09-19 MED ORDER — MOMETASONE FUROATE 0.1 % EX CREA: TOPICAL_CREAM | CUTANEOUS | 1 refills | Status: DC

## 2019-09-19 NOTE — Telephone Encounter (Signed)
Patient's granddaughter message through [place] on 8/11 - Good morning,  On behalf of my grandfather Scott Ashley, he wanted me to message and ask if there was something else he could take for the itching. He said it has not gotten better and he has finished the 3 months of the medication.   Thank you  Paige  On 8/12 mometasone cream was sent in for patient to use twice daily as needed for itch. Replied to patient's granddaughter through [place] advising a rx was sent in and that she should call the office to schedule patient for follow up by 8 weeks, JS

## 2019-10-21 DIAGNOSIS — E7800 Pure hypercholesterolemia, unspecified: Secondary | ICD-10-CM

## 2019-10-24 NOTE — Addendum Note (Signed)
Addended by: Olin Hauser on: 10/24/2019 06:45 PM   Modules accepted: Orders

## 2019-10-31 ENCOUNTER — Ambulatory Visit: Payer: Medicare Other | Admitting: Dermatology

## 2019-10-31 ENCOUNTER — Other Ambulatory Visit: Payer: Self-pay

## 2019-10-31 DIAGNOSIS — B353 Tinea pedis: Secondary | ICD-10-CM | POA: Diagnosis not present

## 2019-10-31 DIAGNOSIS — B356 Tinea cruris: Secondary | ICD-10-CM | POA: Diagnosis not present

## 2019-10-31 DIAGNOSIS — B351 Tinea unguium: Secondary | ICD-10-CM

## 2019-10-31 DIAGNOSIS — B354 Tinea corporis: Secondary | ICD-10-CM | POA: Diagnosis not present

## 2019-10-31 MED ORDER — KETOCONAZOLE 2 % EX CREA: 1.0000 "application " | TOPICAL_CREAM | Freq: Every day | CUTANEOUS | 1 refills | Status: DC

## 2019-10-31 MED ORDER — FLUCONAZOLE 200 MG PO TABS: 200.0000 mg | ORAL_TABLET | Freq: Every day | ORAL | 0 refills | Status: DC

## 2019-10-31 NOTE — Patient Instructions (Signed)
when taking crestor do not take the Diflucan

## 2019-10-31 NOTE — Progress Notes (Signed)
   Follow-Up Visit   Subjective  Scott Ashley is a 86 y.o. male who presents for the following: Tinea with severe itching (buttocks, feet toenails, the itching is on L buttocks/hip, used mometasone cream but did not help finished Terbinafine 90m course).  Patient accompanied by grand-daughter who gives history  The following portions of the chart were reviewed this encounter and updated as appropriate:  Tobacco  Allergies  Meds  Problems  Med Hx  Surg Hx  Fam Hx     Review of Systems:  No other skin or systemic complaints except as noted in HPI or Assessment and Plan.  Objective  Well appearing patient in no apparent distress; mood and affect are within normal limits.  A focused examination was performed including L hip, feet, toenails. Relevant physical exam findings are noted in the Assessment and Plan.  Objective  Left Hip (side) - Posterior: Scaling and maceration web spaces and over distal and lateral soles, L knee, significant involvement on back and buttocks, toenail dystrophy  Images         Assessment & Plan  Tinea corporis -severe and flared and complicated.  He had responded to systemic oral antifungal treatment but immediately after discontinuing it it flared up and restarted and is currently severely symptomatic with itching and spreading all over the body. Buttocks; feet; toenails; legs; back; groin  Tinea pedis, tinea unguium; tinea corporis; tinea cruris  Severe and generalized and quickly recurrent after 66m course of Lamisil  Start Fluconazole 200mg  1 po qd x 1 month (except on days he takes his crestor which granddaughter said it is 2x/wk) Start Ketoconazole 2% cr qd to aa feet, legs, buttocks, and back  No hx of liver problems  fluconazole (DIFLUCAN) 200 MG tablet - Left Hip (side) - Posterior  ketoconazole (NIZORAL) 2 % cream - Left Hip (side) - Posterior  Other Related Medications terbinafine (LAMISIL) 250 MG tablet mometasone (ELOCON) 0.1  % cream  Return in about 5 weeks (around 12/05/2019) for Tinea.  I, Othelia Pulling, RMA, am acting as scribe for Sarina Ser, MD .  Documentation: I have reviewed the above documentation for accuracy and completeness, and I agree with the above.  Sarina Ser, MD

## 2019-11-01 ENCOUNTER — Encounter: Payer: Self-pay | Admitting: Dermatology

## 2019-11-09 ENCOUNTER — Encounter: Payer: Self-pay | Admitting: Dermatology

## 2019-11-11 ENCOUNTER — Other Ambulatory Visit: Payer: Self-pay

## 2019-11-11 MED ORDER — KETOCONAZOLE 2 % EX CREA: 1.0000 "application " | TOPICAL_CREAM | Freq: Every day | CUTANEOUS | Status: AC

## 2019-11-18 ENCOUNTER — Other Ambulatory Visit: Payer: Self-pay | Admitting: Family Medicine

## 2019-11-18 DIAGNOSIS — F5104 Psychophysiologic insomnia: Secondary | ICD-10-CM

## 2019-11-19 ENCOUNTER — Other Ambulatory Visit: Payer: Self-pay | Admitting: Urology

## 2019-11-19 ENCOUNTER — Other Ambulatory Visit: Payer: Self-pay | Admitting: Family Medicine

## 2019-11-19 ENCOUNTER — Other Ambulatory Visit: Payer: Medicare Other

## 2019-11-19 ENCOUNTER — Other Ambulatory Visit: Payer: Self-pay

## 2019-11-19 DIAGNOSIS — E7800 Pure hypercholesterolemia, unspecified: Secondary | ICD-10-CM | POA: Diagnosis not present

## 2019-11-19 DIAGNOSIS — I10 Essential (primary) hypertension: Secondary | ICD-10-CM

## 2019-11-19 NOTE — Telephone Encounter (Signed)
Pt has appt 11/27/19

## 2019-11-20 LAB — LIPID PANEL
Cholesterol: 144 mg/dL (ref <200)
HDL: 37 mg/dL — ABNORMAL LOW (ref >=40)
LDL Cholesterol (Calc): 88 mg/dL (calc)
Non-HDL Cholesterol (Calc): 107 mg/dL (calc) (ref <130)
Total CHOL/HDL Ratio: 3.9 (calc) (ref <5.0)
Triglycerides: 92 mg/dL (ref <150)

## 2019-11-20 LAB — COMPLETE METABOLIC PANEL WITH GFR
AG Ratio: 1.3 (calc) (ref 1.0–2.5)
ALT: 6 U/L — ABNORMAL LOW (ref 9–46)
AST: 11 U/L (ref 10–35)
Albumin: 4.1 g/dL (ref 3.6–5.1)
Alkaline phosphatase (APISO): 78 U/L (ref 35–144)
BUN/Creatinine Ratio: 14 (calc) (ref 6–22)
BUN: 16 mg/dL (ref 7–25)
CO2: 28 mmol/L (ref 20–32)
Calcium: 9.9 mg/dL (ref 8.6–10.3)
Chloride: 99 mmol/L (ref 98–110)
Creat: 1.16 mg/dL — ABNORMAL HIGH (ref 0.70–1.11)
GFR, Est African American: 65 mL/min/{1.73_m2} (ref >=60)
GFR, Est Non African American: 56 mL/min/{1.73_m2} — ABNORMAL LOW (ref >=60)
Globulin: 3.1 g/dL (calc) (ref 1.9–3.7)
Glucose, Bld: 98 mg/dL (ref 65–99)
Potassium: 3.8 mmol/L (ref 3.5–5.3)
Sodium: 140 mmol/L (ref 135–146)
Total Bilirubin: 0.7 mg/dL (ref 0.2–1.2)
Total Protein: 7.2 g/dL (ref 6.1–8.1)

## 2019-11-27 ENCOUNTER — Other Ambulatory Visit: Payer: Self-pay

## 2019-11-27 ENCOUNTER — Encounter: Payer: Self-pay | Admitting: Family Medicine

## 2019-11-27 ENCOUNTER — Ambulatory Visit: Payer: Medicare Other | Admitting: Family Medicine

## 2019-11-27 ENCOUNTER — Ambulatory Visit (INDEPENDENT_AMBULATORY_CARE_PROVIDER_SITE_OTHER): Payer: Medicare Other | Admitting: Family Medicine

## 2019-11-27 VITALS — BP 138/65 | HR 81 | Temp 97.1°F | Resp 16 | Ht 72.0 in | Wt 171.8 lb

## 2019-11-27 DIAGNOSIS — I10 Essential (primary) hypertension: Secondary | ICD-10-CM | POA: Diagnosis not present

## 2019-11-27 DIAGNOSIS — N401 Enlarged prostate with lower urinary tract symptoms: Secondary | ICD-10-CM

## 2019-11-27 DIAGNOSIS — E7800 Pure hypercholesterolemia, unspecified: Secondary | ICD-10-CM | POA: Diagnosis not present

## 2019-11-27 DIAGNOSIS — F5104 Psychophysiologic insomnia: Secondary | ICD-10-CM | POA: Diagnosis not present

## 2019-11-27 DIAGNOSIS — F5101 Primary insomnia: Secondary | ICD-10-CM

## 2019-11-27 DIAGNOSIS — N183 Chronic kidney disease, stage 3 unspecified: Secondary | ICD-10-CM

## 2019-11-27 DIAGNOSIS — N138 Other obstructive and reflux uropathy: Secondary | ICD-10-CM

## 2019-11-27 DIAGNOSIS — N1831 Chronic kidney disease, stage 3a: Secondary | ICD-10-CM

## 2019-11-27 DIAGNOSIS — I129 Hypertensive chronic kidney disease with stage 1 through stage 4 chronic kidney disease, or unspecified chronic kidney disease: Secondary | ICD-10-CM | POA: Diagnosis not present

## 2019-11-27 MED ORDER — MIRTAZAPINE 15 MG PO TABS: 15.0000 mg | ORAL_TABLET | Freq: Every day | ORAL | 3 refills | Status: DC

## 2019-11-27 MED ORDER — BISOPROLOL-HYDROCHLOROTHIAZIDE 5-6.25 MG PO TABS: 1.0000 | ORAL_TABLET | Freq: Every day | ORAL | 3 refills | Status: DC

## 2019-11-27 NOTE — Patient Instructions (Addendum)
Thank you for coming to the office today.  Keep up the good work.  Keep on Rosuvastatin 5mg  TWICE a week even after finish Fluconazole. Seems to be working well.  Stop omeprazole as discussed, its off list now.  Refilled Mirtazapine and BP med - keep on file at pharmacy.  Think about COVID / Flu vaccines!  DUE for FASTING BLOOD WORK (no food or drink after midnight before the lab appointment, only water or coffee without cream/sugar on the morning of)  SCHEDULE "Lab Only" visit in the morning at the clinic for lab draw in 6 MONTHS   - Make sure Lab Only appointment is at about 1 week before your next appointment, so that results will be available  For Lab Results, once available within 2-3 days of blood draw, you can can log in to MyChart online to view your results and a brief explanation. Also, we can discuss results at next follow-up visit.   Please schedule a Follow-up Appointment to: Return in about 6 months (around 05/27/2020) for 6 month fasting lab only then 1 week later Annual Physical.  If you have any other questions or concerns, please feel free to call the office or send a message through [place]. You may also schedule an earlier appointment if necessary.  Additionally, you may be receiving a survey about your experience at our office within a few days to 1 week by e-mail or mail. We value your feedback.  [name], DO [place]

## 2019-11-27 NOTE — Progress Notes (Signed)
Subjective:    Patient ID: Scott Ashley, male    DOB: 08-02-1929, 86 y.o.   MRN: [id number]  Scott Ashley is a 86 y.o. male presenting on 11/27/2019 for Follow-up and Hypertension  Accompanied by granddaughter Scott Ashley who provides additional history.  HPI  CHRONIC HTN with CKD-III / History of Left Renal Mass Labs, improved Creatinine on lab 1.16 from 1.3 Reportsno new concerns. Checking BP regularly. He is drinking plenty of water. He does not take NSAIDs. Takes Tylenol PRN. He is followed by Dr Diamantina Providence at [facility] for L Renal Mass, on imaging they have monitored this, and it is small and on surveillance. They will repeat renal US in 1 year. Current Meds -Bisoprolol-HCTZ 5-6.25mg  Reports good compliance, took meds today. Tolerating well, w/o complaints. Denies CP, dyspnea, HA, edema, dizziness / lightheadedness  History of H Pylori Gastric /Abnormal bowel habits - He was on dual antibiotic therapy, sensitive stomachfrom antibiotics. Has improved - S/p Colonoscopy 10/2017 AGI  HYPERLIPIDEMIA: Had possible myalgia reaction on STATIN. Had stopped then restarted again. For past 3-4 weeks taking Rosuvastatin 5mg  TWICE a week only, while on Fluconazole per Derm Last lipid panel LDL down to 88 from 102 on intermittent statin Now OFF ASA 81mg  daily for primary prevention due to bruising easy bleed  GERD improved. Cut back on sweets, salt, sugar. Off PPI  BPH LUTS / Nocturia Followed by Urology [facility] Dr Diamantina Providence, last seen 12/2018 He is taking OTC Prostagenix and Tamsulosin 0.4mg  daily - good results overall. Occasional nocturia still maybe 1-3 x, overall improved.  Insomnia Reports chronic problem, with some difficulty falling asleep at times. He can occasionally sleep in late and then has difficulty falling asleep at night sometimes. Now improved reports better sleep on med. - He takes Mirtazapine 15mg  nightly (previously was on 7.5mg )   Health  Maintenance: Patient declines COVID19 vaccine at this time. Counseling provided.   Depression screen [facility] 2/9 11/27/2019 05/28/2019 04/09/2019  Decreased Interest 0 0 0  Down, Depressed, Hopeless 0 0 0  PHQ - 2 Score 0 0 0  Altered sleeping - - -  Tired, decreased energy - - -  Change in appetite - - -  Feeling bad or failure about yourself  - - -  Trouble concentrating - - -  Moving slowly or fidgety/restless - - -  Suicidal thoughts - - -  PHQ-9 Score - - -    Social History   Tobacco Use  . Smoking status: Never Smoker  . Smokeless tobacco: Never Used  Substance Use Topics  . Alcohol use: Never  . Drug use: Never    Review of Systems Per HPI unless specifically indicated above     Objective:    BP 138/65   Pulse 81   Temp (!) 97.1 F (36.2 C) (Temporal)   Resp 16   Ht 6' (1.829 m)   Wt 171 lb 12.8 oz (77.9 kg)   SpO2 100%   BMI 23.30 kg/m   Wt Readings from Last 3 Encounters:  11/27/19 171 lb 12.8 oz (77.9 kg)  05/28/19 186 lb 9.6 oz (84.6 kg)  05/28/19 186 lb 9.6 oz (84.6 kg)    Physical Exam Vitals and nursing note reviewed.  Constitutional:      General: He is not in acute distress.    Appearance: He is well-developed. He is not diaphoretic.     Comments: Well-appearing, comfortable, cooperative  HENT:     Head: Normocephalic and atraumatic.  Right Ear: There is impacted cerumen.     Ears:     Comments: L hearing aid Eyes:     General:        Right eye: No discharge.        Left eye: No discharge.     Conjunctiva/sclera: Conjunctivae normal.  Neck:     Thyroid: No thyromegaly.  Cardiovascular:     Rate and Rhythm: Normal rate and regular rhythm.     Heart sounds: Normal heart sounds. No murmur heard.   Pulmonary:     Effort: Pulmonary effort is normal. No respiratory distress.     Breath sounds: Normal breath sounds. No wheezing or rales.  Musculoskeletal:        General: Normal range of motion.     Cervical back: Normal range of motion  and neck supple.  Lymphadenopathy:     Cervical: No cervical adenopathy.  Skin:    General: Skin is warm and dry.     Findings: No erythema or rash.  Neurological:     Mental Status: He is alert and oriented to person, place, and time.  Psychiatric:        Behavior: Behavior normal.     Comments: Well groomed, good eye contact, normal speech and thoughts        Results for orders placed or performed in visit on 11/19/19  COMPLETE METABOLIC PANEL WITH GFR  Result Value Ref Range   Glucose, Bld 98 65 - 99 mg/dL   BUN 16 7 - 25 mg/dL   Creat 1.16 (H) 0.70 - 1.11 mg/dL   GFR, Est Non African American 56 (L) > OR = 60 mL/min/1.68m2   GFR, Est African American 65 > OR = 60 mL/min/1.54m2   BUN/Creatinine Ratio 14 6 - 22 (calc)   Sodium 140 135 - 146 mmol/L   Potassium 3.8 3.5 - 5.3 mmol/L   Chloride 99 98 - 110 mmol/L   CO2 28 20 - 32 mmol/L   Calcium 9.9 8.6 - 10.3 mg/dL   Total Protein 7.2 6.1 - 8.1 g/dL   Albumin 4.1 3.6 - 5.1 g/dL   Globulin 3.1 1.9 - 3.7 g/dL (calc)   AG Ratio 1.3 1.0 - 2.5 (calc)   Total Bilirubin 0.7 0.2 - 1.2 mg/dL   Alkaline phosphatase (APISO) 78 35 - 144 U/L   AST 11 10 - 35 U/L   ALT 6 (L) 9 - 46 U/L  Lipid panel  Result Value Ref Range   Cholesterol 144 <200 mg/dL   HDL 37 (L) > OR = 40 mg/dL   Triglycerides 92 <150 mg/dL   LDL Cholesterol (Calc) 88 mg/dL (calc)   Total CHOL/HDL Ratio 3.9 <5.0 (calc)   Non-HDL Cholesterol (Calc) 107 <130 mg/dL (calc)      Assessment & Plan:   Problem List Items Addressed This Visit    Pure hypercholesterolemia - Primary    Controlled cholesterol on lifestyle and intermittent statin now Last lipid panel 11/2019 improved LDL 102 > 87 Calculated ASCVD 10 yr risk score elevated. Age 54 Myalgia on regular dosing statin Rosvau 5-10mg  OFF ASA 81 due to bruising bleeding  Plan: 1. Continue intermittent STATIN - Rosuvastatin 5mg  twice a week Encourage improved lifestyle - low carb/cholesterol, reduce portion  size, continue improving regular exercise      Relevant Medications   bisoprolol-hydrochlorothiazide (ZIAC) 5-6.25 MG tablet   Insomnia    Improved control on med management Chronic problem Some sleep cycle issues with sleeping in  late morning worsen sleep  Continue current Mirtazapine 15mg  daily, no significant depression associated. Seems to be effective for his sleep onset.      Relevant Medications   mirtazapine (REMERON) 15 MG tablet   CKD (chronic kidney disease), stage III    See A&P HTN      BPH with obstruction/lower urinary tract symptoms    Improved. Mostly controlled. Some nocturia Followed by [facility] Dr Diamantina Providence On Flomax 0.4mg  daily and prostate supplement OTC  Check PSA next visit      Benign hypertension with CKD (chronic kidney disease) stage III ([place])    Well-controlled HTN Complication with CKD-III, mild improved Cr on lab    Plan:  1. Continue current BP regimen - Bisoprolol-HCTZ 5-6.25mg  2. Encourage improved lifestyle - low sodium diet, regular exercise 3. Continue monitor BP outside office, bring readings to next visit, if persistently >140/90 or new symptoms notify office sooner      Relevant Medications   bisoprolol-hydrochlorothiazide (ZIAC) 5-6.25 MG tablet    Other Visit Diagnoses    Essential hypertension       Relevant Medications   bisoprolol-hydrochlorothiazide (ZIAC) 5-6.25 MG tablet      Meds ordered this encounter  Medications  . mirtazapine (REMERON) 15 MG tablet    Sig: Take 1 tablet (15 mg total) by mouth at bedtime.    Dispense:  90 tablet    Refill:  3    Add refills on file  . bisoprolol-hydrochlorothiazide (ZIAC) 5-6.25 MG tablet    Sig: Take 1 tablet by mouth daily.    Dispense:  90 tablet    Refill:  3    Add refills on file      Follow up plan: Return in about 6 months (around 05/27/2020) for 6 month fasting lab only then 1 week later Annual Physical.  Future labs ordered for 05/2020   [name], [facility] 11/27/2019, 4:07 PM

## 2019-11-28 ENCOUNTER — Other Ambulatory Visit: Payer: Self-pay | Admitting: Family Medicine

## 2019-11-28 DIAGNOSIS — R7309 Other abnormal glucose: Secondary | ICD-10-CM

## 2019-11-28 DIAGNOSIS — N138 Other obstructive and reflux uropathy: Secondary | ICD-10-CM

## 2019-11-28 DIAGNOSIS — E7800 Pure hypercholesterolemia, unspecified: Secondary | ICD-10-CM

## 2019-11-28 DIAGNOSIS — F5104 Psychophysiologic insomnia: Secondary | ICD-10-CM

## 2019-11-28 DIAGNOSIS — I129 Hypertensive chronic kidney disease with stage 1 through stage 4 chronic kidney disease, or unspecified chronic kidney disease: Secondary | ICD-10-CM

## 2019-11-28 DIAGNOSIS — N183 Chronic kidney disease, stage 3 unspecified: Secondary | ICD-10-CM

## 2019-11-28 DIAGNOSIS — Z0000 Encounter for general adult medical examination without abnormal findings: Secondary | ICD-10-CM

## 2019-11-28 NOTE — Assessment & Plan Note (Signed)
Improved control on med management Chronic problem Some sleep cycle issues with sleeping in late morning worsen sleep  Continue current Mirtazapine 15mg  daily, no significant depression associated. Seems to be effective for his sleep onset.

## 2019-11-28 NOTE — Assessment & Plan Note (Signed)
Improved. Mostly controlled. Some nocturia Followed by [facility] Dr Diamantina Providence On Flomax 0.4mg  daily and prostate supplement OTC  Check PSA next visit

## 2019-11-28 NOTE — Assessment & Plan Note (Signed)
See A&P HTN

## 2019-11-28 NOTE — Assessment & Plan Note (Signed)
Well-controlled HTN Complication with CKD-III, mild improved Cr on lab    Plan:  1. Continue current BP regimen - Bisoprolol-HCTZ 5-6.25mg  2. Encourage improved lifestyle - low sodium diet, regular exercise 3. Continue monitor BP outside office, bring readings to next visit, if persistently >140/90 or new symptoms notify office sooner

## 2019-11-28 NOTE — Assessment & Plan Note (Signed)
Controlled cholesterol on lifestyle and intermittent statin now Last lipid panel 11/2019 improved LDL 102 > 87 Calculated ASCVD 10 yr risk score elevated. Age 86 Myalgia on regular dosing statin Rosvau 5-10mg  OFF ASA 81 due to bruising bleeding  Plan: 1. Continue intermittent STATIN - Rosuvastatin 5mg  twice a week Encourage improved lifestyle - low carb/cholesterol, reduce portion size, continue improving regular exercise

## 2019-12-05 ENCOUNTER — Other Ambulatory Visit: Payer: Self-pay

## 2019-12-05 ENCOUNTER — Ambulatory Visit (INDEPENDENT_AMBULATORY_CARE_PROVIDER_SITE_OTHER): Payer: Medicare Other | Admitting: Dermatology

## 2019-12-05 DIAGNOSIS — B354 Tinea corporis: Secondary | ICD-10-CM

## 2019-12-05 MED ORDER — KETOCONAZOLE 2 % EX CREA: 1.0000 "application " | TOPICAL_CREAM | Freq: Every day | CUTANEOUS | 1 refills | Status: DC

## 2019-12-05 MED ORDER — FLUCONAZOLE 200 MG PO TABS: 200.0000 mg | ORAL_TABLET | Freq: Every day | ORAL | 0 refills | Status: DC

## 2019-12-05 NOTE — Progress Notes (Signed)
   Follow-Up Visit   Subjective  Scott Ashley is a 86 y.o. male who presents for the following: Rash (5 weeks f/u on Tinea Corporis treating with Diflucan 200 mg daily except days pt take crestor, using Ketoconazole cream ).  Granddaughter with pt contributing to history  The following portions of the chart were reviewed this encounter and updated as appropriate:  Tobacco  Allergies  Meds  Problems  Med Hx  Surg Hx  Fam Hx      Review of Systems:  No other skin or systemic complaints except as noted in HPI or Assessment and Plan.  Objective  Well appearing patient in no apparent distress; mood and affect are within normal limits.  A focused examination was performed including trunk, exts . Relevant physical exam findings are noted in the Assessment and Plan.  Objective  trunk, exts, buttock, toes, feet: Scaling and maceration web spaces and over distal and lateral soles, L knee, significant involvement on back and buttocks, toenail dystrophy   Assessment & Plan  Tinea corporis trunk, exts, buttock, toes, feet  Tinea corporis -severe and flared and complicated.  He had responded to systemic oral antifungal treatment but immediately after discontinuing it it flared up and restarted and is currently severely symptomatic with itching and spreading all over the body, improving     Tinea pedis, tinea unguium; tinea corporis; tinea cruris   Severe and generalized and quickly recurrent after 83m course of Lamisil   Cont Fluconazole 200mg  1 po qd x 1 month (except on days he takes his crestor which granddaughter said it is 2x/wk) Cont  Ketoconazole 2% cr qd to aa feet, legs, buttocks, and back   No hx of liver problems  Reordered Medications ketoconazole (NIZORAL) 2 % cream fluconazole (DIFLUCAN) 200 MG tablet  Return in about 5 weeks (around 01/09/2020).   IMarye Round, CMA, am acting as scribe for Sarina Ser, MD .  Documentation: I have reviewed the above  documentation for accuracy and completeness, and I agree with the above.  Sarina Ser, MD

## 2019-12-06 ENCOUNTER — Encounter: Payer: Self-pay | Admitting: Dermatology

## 2019-12-09 ENCOUNTER — Encounter: Payer: Self-pay | Admitting: Dermatology

## 2019-12-10 ENCOUNTER — Telehealth: Payer: Self-pay | Admitting: Urology

## 2019-12-10 NOTE — Telephone Encounter (Signed)
Pt. cancelled RUS and wants to see Dr. Diamantina Providence first before scheduling a RUS. (per grandaughter Arby Barrette) < -------  I copied this from the appt note before I resched appt out to 2022  Per grandaughter paige, pt wants to defer office visit until March/April of 2022, she states pt is scared of getting covid and wantd to defer appt.  FYI

## 2019-12-13 ENCOUNTER — Ambulatory Visit: Payer: Medicare Other

## 2019-12-19 ENCOUNTER — Ambulatory Visit: Payer: Medicare Other | Admitting: Urology

## 2020-01-15 ENCOUNTER — Ambulatory Visit: Payer: Medicare Other | Admitting: Dermatology

## 2020-01-15 ENCOUNTER — Other Ambulatory Visit: Payer: Self-pay

## 2020-01-15 DIAGNOSIS — B354 Tinea corporis: Secondary | ICD-10-CM

## 2020-01-15 MED ORDER — KETOCONAZOLE 2 % EX CREA: 1.0000 "application " | TOPICAL_CREAM | Freq: Every day | CUTANEOUS | 11 refills | Status: DC

## 2020-01-15 MED ORDER — FLUCONAZOLE 200 MG PO TABS: 200.0000 mg | ORAL_TABLET | Freq: Every day | ORAL | 0 refills | Status: DC

## 2020-01-15 NOTE — Progress Notes (Signed)
   Follow-Up Visit   Subjective  Scott Ashley is a 86 y.o. male who presents for the following: Follow-up (Tinea).  Accompanied by granddaughter  The following portions of the chart were reviewed this encounter and updated as appropriate:   Tobacco  Allergies  Meds  Problems  Med Hx  Surg Hx  Fam Hx      Review of Systems:  No other skin or systemic complaints except as noted in HPI or Assessment and Plan.  Objective  Well appearing patient in no apparent distress; mood and affect are within normal limits.  A focused examination was performed including legs, buttocks. Relevant physical exam findings are noted in the Assessment and Plan.  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  He had responded to systemic oral antifungal treatment but immediately after discontinuing it it flared up and restarted. He is not clear today but is improved.  Continue Fluconazole 200 mg 1 po qd x 1 more month  Continue Ketoconazole 2% cream qhs   fluconazole (DIFLUCAN) 200 MG tablet - Legs, buttocks  ketoconazole (NIZORAL) 2 % cream - Legs, buttocks  Return in about 3 months (around 04/14/2020).   I, Scott Ashley, CMA, am acting as scribe for Scott Ser, MD .  Documentation: I have reviewed the above documentation for accuracy and completeness, and I agree with the above.  Scott Ser, MD

## 2020-01-17 DIAGNOSIS — H6123 Impacted cerumen, bilateral: Secondary | ICD-10-CM | POA: Diagnosis not present

## 2020-01-21 ENCOUNTER — Encounter: Payer: Self-pay | Admitting: Dermatology

## 2020-02-21 ENCOUNTER — Other Ambulatory Visit: Payer: Self-pay | Admitting: Family Medicine

## 2020-02-21 ENCOUNTER — Other Ambulatory Visit: Payer: Self-pay | Admitting: Urology

## 2020-02-21 DIAGNOSIS — I10 Essential (primary) hypertension: Secondary | ICD-10-CM

## 2020-02-21 DIAGNOSIS — F5104 Psychophysiologic insomnia: Secondary | ICD-10-CM

## 2020-02-26 ENCOUNTER — Ambulatory Visit: Payer: Medicare Other | Admitting: Dermatology

## 2020-04-15 ENCOUNTER — Ambulatory Visit: Payer: Medicare Other | Admitting: Urology

## 2020-04-18 IMAGING — CR DG ABDOMEN ACUTE W/ 1V CHEST
1 series · 3 of 3 positions shown · non-contrast
Comparison: CT abdomen and pelvis 08/24/2017. Abdominal series
07/07/2017.

CLINICAL DATA: Persistent diarrhea for 1 week. Nausea without
vomiting. Low back pain.

EXAM:
DG ABDOMEN ACUTE W/ 1V CHEST

[Series 1: dg abd acute w/chest · 0.14mm/px · 3 of 3 slices shown]
[im 1/3]
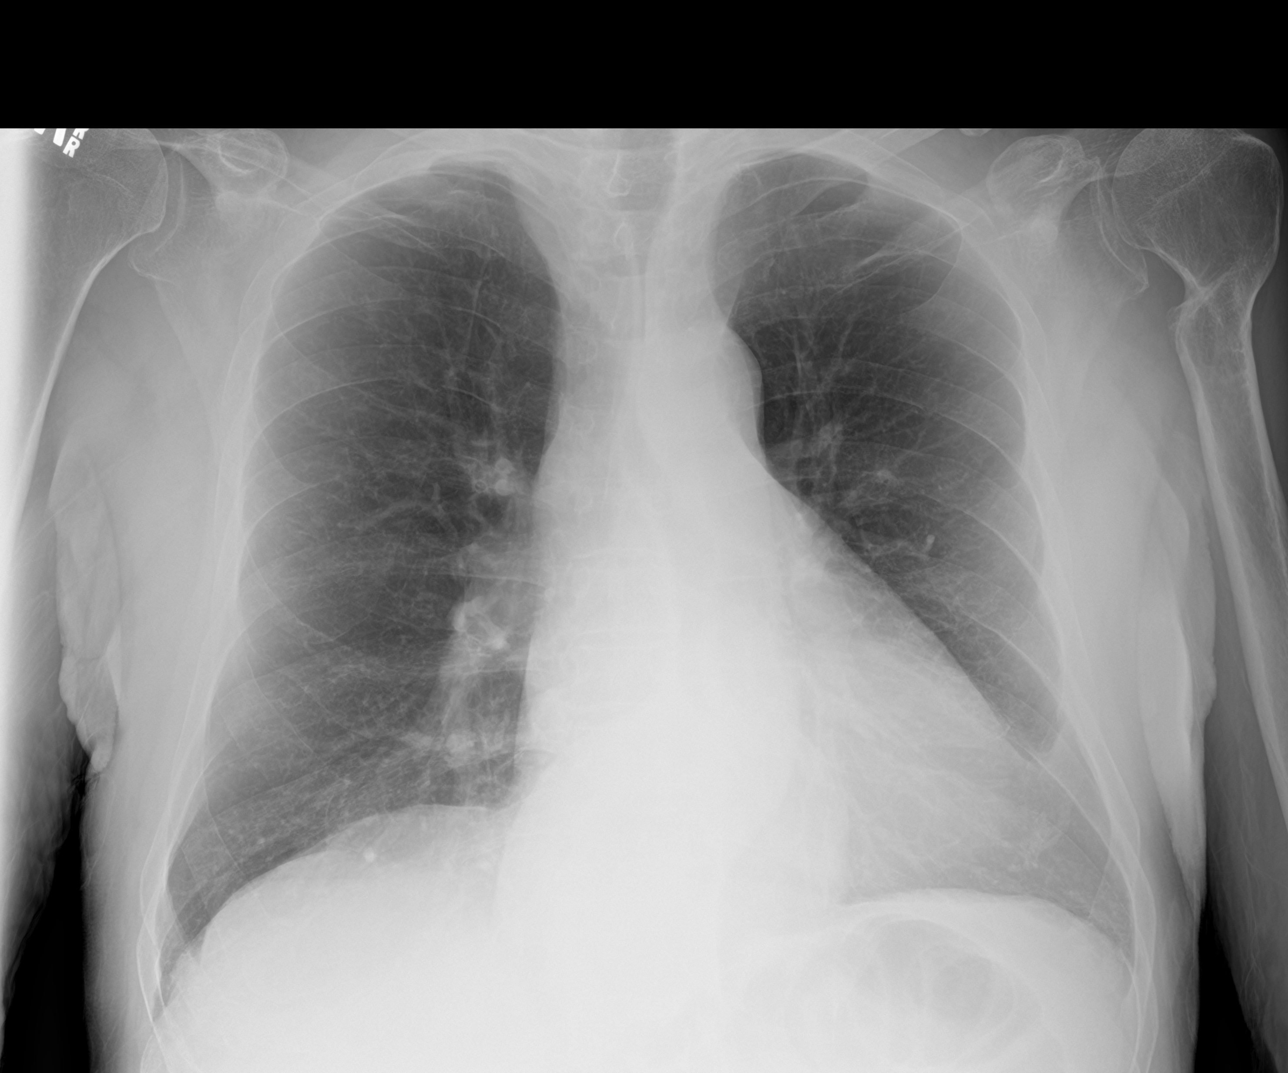
[im 2/3]
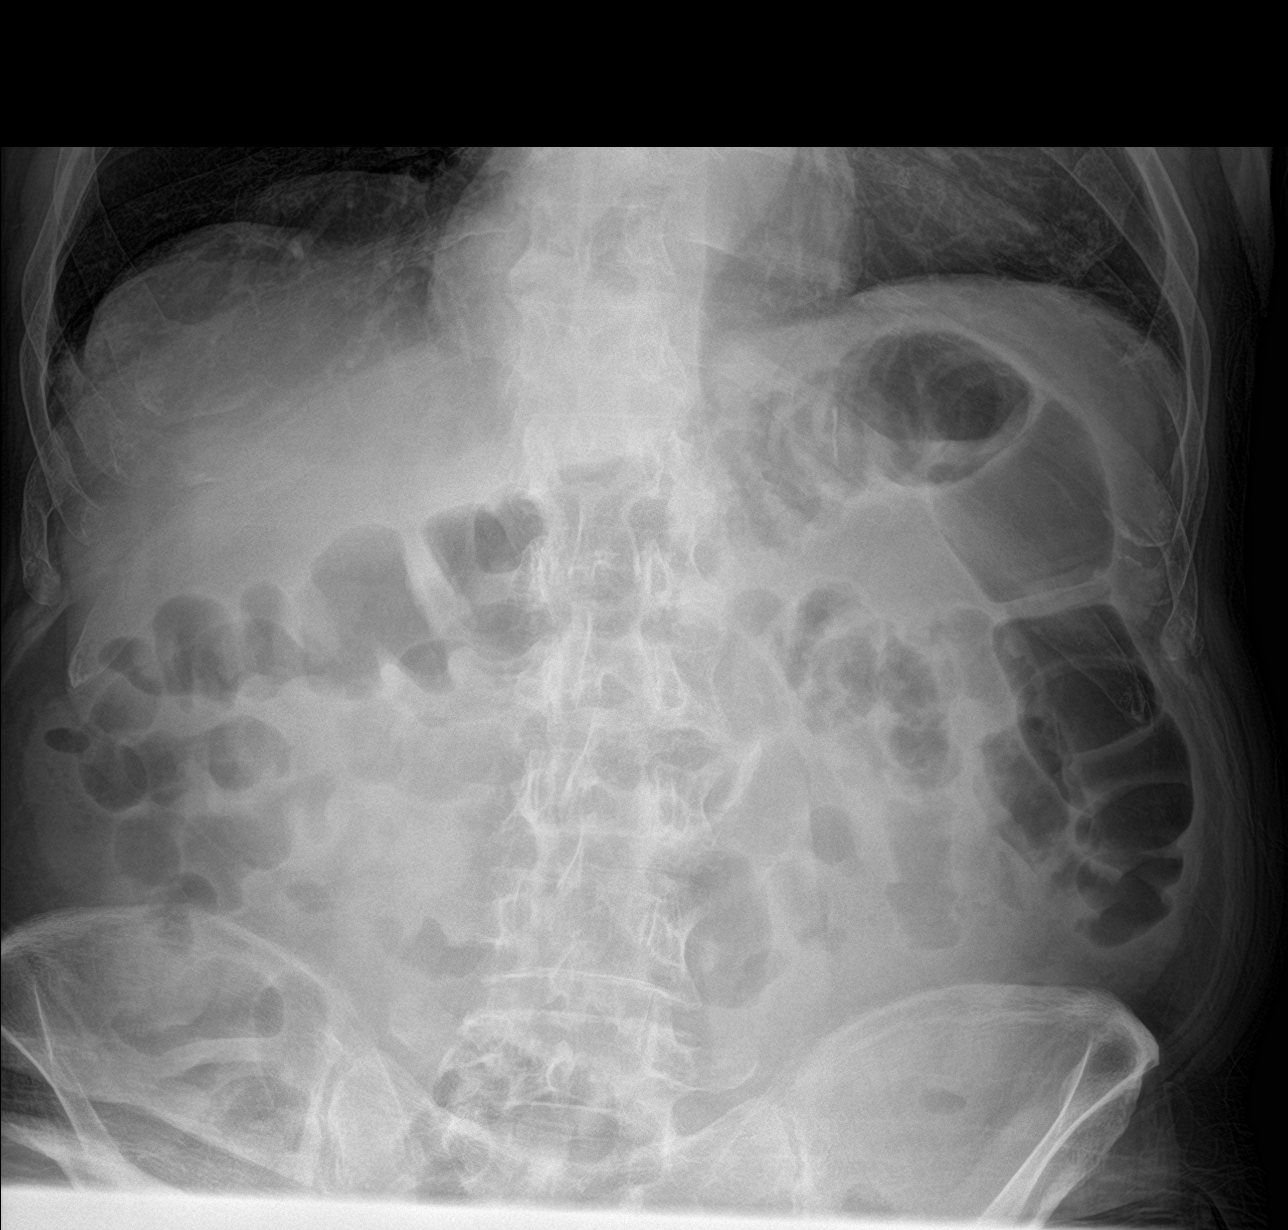
[im 3/3]
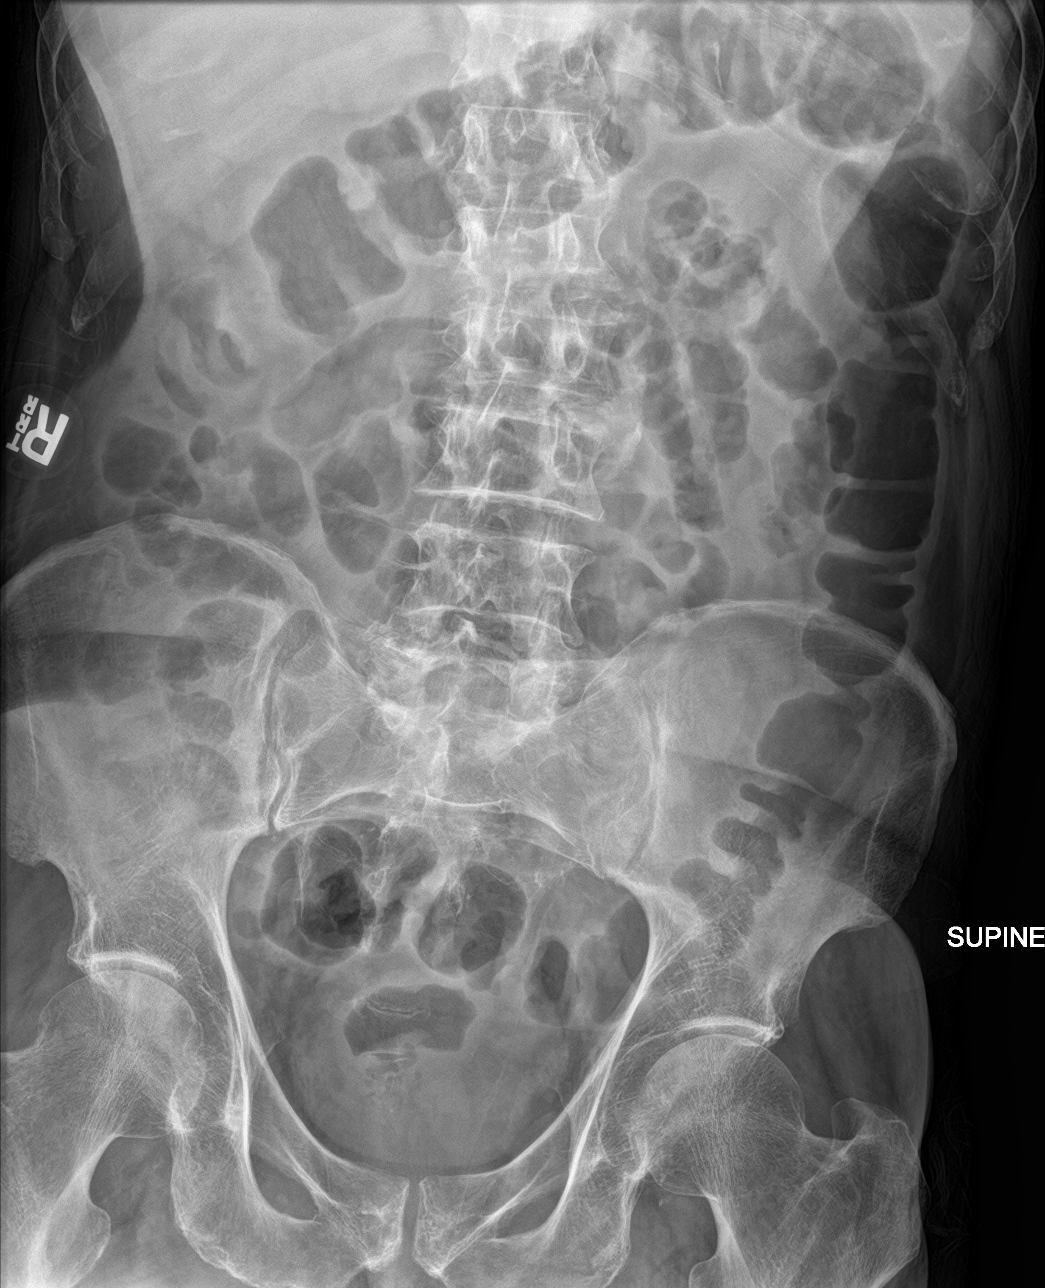

[3 of 3 positions shown; findings below may reference images not displayed]

FINDINGS: Borderline heart size. No vascular congestion, edema, or
consolidation. No blunting of costophrenic angles. No pneumothorax.
Mediastinal contours appear intact. Calcified and tortuous aorta.
Esophageal hiatal hernia behind the heart. Old left rib fractures.
Old fracture deformities of the left clavicle and left proximal
humerus.

Scattered gas in the small and large bowel without abnormal
distention. This likely represents normal bowel-gas or mild ileus.
No free intra-abdominal air. No abnormal air-fluid levels. No
radiopaque stones. Degenerative changes in the spine and hips. Soft
tissue contours appear intact. Vascular calcifications.
IMPRESSION: No evidence of active pulmonary disease. Nondistended gas-filled
small and large bowel may indicate normal pattern or mild ileus. No
evidence of obstruction..

## 2020-04-21 ENCOUNTER — Telehealth: Payer: Self-pay | Admitting: Gastroenterology

## 2020-04-21 NOTE — Telephone Encounter (Signed)
Pt's granddaughter returned my call and was advised pt can start OTC Miralax daily. 1 to 2 capfuls daily until his appt. Advised if he starts getting diarrhea, he can just do 1 capful daily.

## 2020-04-21 NOTE — Telephone Encounter (Signed)
Left grandaughter, Arby Barrette a voicemail to return my call.

## 2020-04-21 NOTE — Telephone Encounter (Signed)
Granddaughter calling for patient, wants call back to get advice on what patient can do at home for constipation, has appointment scheduled 05/11/20

## 2020-04-28 ENCOUNTER — Ambulatory Visit: Payer: Medicare Other | Admitting: Dermatology

## 2020-05-04 ENCOUNTER — Ambulatory Visit: Payer: Medicare Other | Admitting: Urology

## 2020-05-11 ENCOUNTER — Other Ambulatory Visit: Payer: Self-pay

## 2020-05-11 ENCOUNTER — Ambulatory Visit (INDEPENDENT_AMBULATORY_CARE_PROVIDER_SITE_OTHER): Payer: Medicare Other | Admitting: Gastroenterology

## 2020-05-11 ENCOUNTER — Encounter: Payer: Self-pay | Admitting: Gastroenterology

## 2020-05-11 VITALS — BP 161/93 | HR 86 | Temp 97.6°F | Ht 72.0 in | Wt 173.0 lb

## 2020-05-11 DIAGNOSIS — K5904 Chronic idiopathic constipation: Secondary | ICD-10-CM | POA: Diagnosis not present

## 2020-05-11 NOTE — Progress Notes (Signed)
Primary Care Physician: Olin Hauser, DO  Primary Gastroenterologist:  Dr. Lucilla Lame  Chief Complaint  Patient presents with  . New Patient (Initial Visit)    Constipation, 2nd opinion     HPI: Scott Ashley is a 87 y.o. male here with a history of constipation.  The patient had a colonoscopy by me back in 2019 for the same issues and had 2 small polyps at that time.  Prior to seeing me for the colonoscopy and upper endoscopy he was seeing Dr. Bonna Gains.  The EGD and colonoscopy were recommended due to his constipation and weight loss. The patient reports that he took laxatives a few days ago and has been moving his bowels every day while on the laxatives.  He also states that he would like to know if he can take a stool softener every day or if that would be dangerous for him.  As long as he takes a stool softener every day he feels to be doing very well.  He takes over-the-counter medication for his stool softener.  There is no report of any unexplained weight loss fevers chills nausea vomiting black stools or bloody stools.  Past Medical History:  Diagnosis Date  . Anxiety   . Glaucoma   . Hypertension   . Urinary incontinence     Current Outpatient Medications  Medication Sig Dispense Refill  . acetaminophen (TYLENOL) 500 MG tablet Take 500 mg by mouth every 6 (six) hours as needed.    . bisoprolol-hydrochlorothiazide (ZIAC) 5-6.25 MG tablet TAKE 1 TABLET BY MOUTH DAILY 90 tablet 0  . ketoconazole (NIZORAL) 2 % cream Apply 1 application topically daily. qhs to feet, legs, buttocks and back 180 g 11  . latanoprost (XALATAN) 0.005 % ophthalmic solution INT 1 GTT IN OU QHS  4  . mirtazapine (REMERON) 15 MG tablet TAKE 1 TABLET(15 MG) BY MOUTH AT BEDTIME 90 tablet 0  . rosuvastatin (CRESTOR) 5 MG tablet Take 5 mg by mouth 2 (two) times a week. 45 tablet 3  . Specialty Vitamins Products (PROSTATE PO) Take by mouth.    . tamsulosin (FLOMAX) 0.4 MG CAPS capsule TAKE 1  CAPSULE BY MOUTH EVERY DAY 30 MINUTES AFTER THE SAME MEAL 90 capsule 0   No current facility-administered medications for this visit.    Allergies as of 05/11/2020 - Review Complete 05/11/2020  Allergen Reaction Noted  . Codeine Other (See Comments) 06/10/2016    ROS:  General: Negative for anorexia, weight loss, fever, chills, fatigue, weakness. ENT: Negative for hoarseness, difficulty swallowing , nasal congestion. CV: Negative for chest pain, angina, palpitations, dyspnea on exertion, peripheral edema.  Respiratory: Negative for dyspnea at rest, dyspnea on exertion, cough, sputum, wheezing.  GI: See history of present illness. GU:  Negative for dysuria, hematuria, urinary incontinence, urinary frequency, nocturnal urination.  Endo: Negative for unusual weight change.    Physical Examination:   BP (!) 161/93   Pulse 86   Temp 97.6 F (36.4 C) (Temporal)   Ht 6' (1.829 m)   Wt 173 lb (78.5 kg)   BMI 23.46 kg/m   General: Well-nourished, well-developed in no acute distress.  Eyes: No icterus. Conjunctivae pink. Lungs: Clear to auscultation bilaterally. Non-labored. Heart: Regular rate and rhythm, no murmurs rubs or gallops.  Abdomen: Bowel sounds are normal, nontender, nondistended, no hepatosplenomegaly or masses, no abdominal bruits or hernia , no rebound or guarding.   Extremities: No lower extremity edema. No clubbing or deformities. Neuro: Alert and  oriented x 3.  Grossly intact. Skin: Warm and dry, no jaundice.   Psych: Alert and cooperative, normal mood and affect.  Labs:    Imaging Studies: No results found.  Assessment and Plan:   Scott Ashley is a 87 y.o. y/o male who comes in today with a history of constipation.  The patient has been told that he should be on something every day to avoid his constipation because when he does have it it seems to be very debilitating.  The patient has been explained the plan and agrees with it.     Lucilla Lame, MD.  Marval Regal    Note: This dictation was prepared with Dragon dictation along with smaller phrase technology. Any transcriptional errors that result from this process are unintentional.

## 2020-05-17 ENCOUNTER — Other Ambulatory Visit: Payer: Self-pay | Admitting: Urology

## 2020-05-18 ENCOUNTER — Other Ambulatory Visit: Payer: Self-pay | Admitting: Family Medicine

## 2020-05-18 DIAGNOSIS — I10 Essential (primary) hypertension: Secondary | ICD-10-CM

## 2020-05-18 DIAGNOSIS — F5104 Psychophysiologic insomnia: Secondary | ICD-10-CM

## 2020-05-22 ENCOUNTER — Telehealth: Payer: Self-pay

## 2020-05-22 ENCOUNTER — Other Ambulatory Visit: Payer: Medicare Other

## 2020-05-22 ENCOUNTER — Other Ambulatory Visit: Payer: Self-pay

## 2020-05-22 DIAGNOSIS — Z0000 Encounter for general adult medical examination without abnormal findings: Secondary | ICD-10-CM

## 2020-05-22 DIAGNOSIS — I129 Hypertensive chronic kidney disease with stage 1 through stage 4 chronic kidney disease, or unspecified chronic kidney disease: Secondary | ICD-10-CM

## 2020-05-22 DIAGNOSIS — E7800 Pure hypercholesterolemia, unspecified: Secondary | ICD-10-CM | POA: Diagnosis not present

## 2020-05-22 DIAGNOSIS — N138 Other obstructive and reflux uropathy: Secondary | ICD-10-CM | POA: Diagnosis not present

## 2020-05-22 DIAGNOSIS — R7309 Other abnormal glucose: Secondary | ICD-10-CM

## 2020-05-22 DIAGNOSIS — N183 Chronic kidney disease, stage 3 unspecified: Secondary | ICD-10-CM

## 2020-05-22 DIAGNOSIS — F5104 Psychophysiologic insomnia: Secondary | ICD-10-CM

## 2020-05-22 NOTE — Telephone Encounter (Signed)
error 

## 2020-05-23 LAB — CBC WITH DIFFERENTIAL/PLATELET
Absolute Monocytes: 505 cells/uL (ref 200–950)
Basophils Absolute: 50 cells/uL (ref 0–200)
Basophils Relative: 1 %
Eosinophils Absolute: 220 cells/uL (ref 15–500)
Eosinophils Relative: 4.4 %
HCT: 44.4 % (ref 38.5–50.0)
Hemoglobin: 14.2 g/dL (ref 13.2–17.1)
Lymphs Abs: 1500 cells/uL (ref 850–3900)
MCH: 26.8 pg — ABNORMAL LOW (ref 27.0–33.0)
MCHC: 32 g/dL (ref 32.0–36.0)
MCV: 83.9 fL (ref 80.0–100.0)
MPV: 12.5 fL (ref 7.5–12.5)
Monocytes Relative: 10.1 %
Neutro Abs: 2725 cells/uL (ref 1500–7800)
Neutrophils Relative %: 54.5 %
Platelets: 117 10*3/uL — ABNORMAL LOW (ref 140–400)
RBC: 5.29 10*6/uL (ref 4.20–5.80)
RDW: 13.6 % (ref 11.0–15.0)
Total Lymphocyte: 30 %
WBC: 5 10*3/uL (ref 3.8–10.8)

## 2020-05-23 LAB — COMPLETE METABOLIC PANEL WITH GFR
AG Ratio: 1.5 (calc) (ref 1.0–2.5)
ALT: 4 U/L — ABNORMAL LOW (ref 9–46)
AST: 16 U/L (ref 10–35)
Albumin: 4.3 g/dL (ref 3.6–5.1)
Alkaline phosphatase (APISO): 62 U/L (ref 35–144)
BUN/Creatinine Ratio: 13 (calc) (ref 6–22)
BUN: 15 mg/dL (ref 7–25)
CO2: 30 mmol/L (ref 20–32)
Calcium: 9.9 mg/dL (ref 8.6–10.3)
Chloride: 100 mmol/L (ref 98–110)
Creat: 1.13 mg/dL — ABNORMAL HIGH (ref 0.70–1.11)
GFR, Est African American: 66 mL/min/{1.73_m2} (ref >=60)
GFR, Est Non African American: 57 mL/min/{1.73_m2} — ABNORMAL LOW (ref >=60)
Globulin: 2.8 g/dL (calc) (ref 1.9–3.7)
Glucose, Bld: 96 mg/dL (ref 65–99)
Potassium: 3.6 mmol/L (ref 3.5–5.3)
Sodium: 140 mmol/L (ref 135–146)
Total Bilirubin: 0.7 mg/dL (ref 0.2–1.2)
Total Protein: 7.1 g/dL (ref 6.1–8.1)

## 2020-05-23 LAB — LIPID PANEL
Cholesterol: 156 mg/dL (ref <200)
HDL: 49 mg/dL (ref >=40)
LDL Cholesterol (Calc): 91 mg/dL (calc)
Non-HDL Cholesterol (Calc): 107 mg/dL (calc) (ref <130)
Total CHOL/HDL Ratio: 3.2 (calc) (ref <5.0)
Triglycerides: 71 mg/dL (ref <150)

## 2020-05-23 LAB — HEMOGLOBIN A1C
Hgb A1c MFr Bld: 5.6 % of total Hgb (ref <5.7)
Mean Plasma Glucose: 114 mg/dL
eAG (mmol/L): 6.3 mmol/L

## 2020-05-23 LAB — PSA: PSA: 3 ng/mL (ref <=4.0)

## 2020-05-23 LAB — TSH: TSH: 5.8 mIU/L — ABNORMAL HIGH (ref 0.40–4.50)

## 2020-05-26 ENCOUNTER — Encounter: Payer: Self-pay | Admitting: Family Medicine

## 2020-05-26 DIAGNOSIS — R7989 Other specified abnormal findings of blood chemistry: Secondary | ICD-10-CM | POA: Insufficient documentation

## 2020-05-29 ENCOUNTER — Encounter: Payer: Self-pay | Admitting: Family Medicine

## 2020-05-29 ENCOUNTER — Other Ambulatory Visit: Payer: Self-pay | Admitting: Family Medicine

## 2020-05-29 ENCOUNTER — Encounter: Payer: Medicare Other | Admitting: Family Medicine

## 2020-05-29 ENCOUNTER — Ambulatory Visit (INDEPENDENT_AMBULATORY_CARE_PROVIDER_SITE_OTHER): Payer: Medicare Other | Admitting: Family Medicine

## 2020-05-29 ENCOUNTER — Other Ambulatory Visit: Payer: Self-pay

## 2020-05-29 VITALS — BP 124/64 | HR 71 | Ht 71.0 in | Wt 170.2 lb

## 2020-05-29 DIAGNOSIS — N138 Other obstructive and reflux uropathy: Secondary | ICD-10-CM

## 2020-05-29 DIAGNOSIS — N183 Chronic kidney disease, stage 3 unspecified: Secondary | ICD-10-CM

## 2020-05-29 DIAGNOSIS — R7989 Other specified abnormal findings of blood chemistry: Secondary | ICD-10-CM

## 2020-05-29 DIAGNOSIS — N401 Enlarged prostate with lower urinary tract symptoms: Secondary | ICD-10-CM

## 2020-05-29 DIAGNOSIS — E7800 Pure hypercholesterolemia, unspecified: Secondary | ICD-10-CM | POA: Diagnosis not present

## 2020-05-29 DIAGNOSIS — F5101 Primary insomnia: Secondary | ICD-10-CM | POA: Diagnosis not present

## 2020-05-29 DIAGNOSIS — I129 Hypertensive chronic kidney disease with stage 1 through stage 4 chronic kidney disease, or unspecified chronic kidney disease: Secondary | ICD-10-CM

## 2020-05-29 DIAGNOSIS — Z0000 Encounter for general adult medical examination without abnormal findings: Secondary | ICD-10-CM | POA: Diagnosis not present

## 2020-05-29 DIAGNOSIS — N1831 Chronic kidney disease, stage 3a: Secondary | ICD-10-CM

## 2020-05-29 NOTE — Assessment & Plan Note (Signed)
Improved control on med management °Chronic problem °Some sleep cycle issues with sleeping in late morning worsen sleep ° °Continue current Mirtazapine 15mg daily, no significant depression associated. Seems to be effective for his sleep onset. °

## 2020-05-29 NOTE — Assessment & Plan Note (Signed)
Controlled Followed by [facility] Dr Diamantina Providence On Flomax 0.4mg  daily and prostate supplement OTC  PSA 3.0, appropriate for age and BPH. F/u urology

## 2020-05-29 NOTE — Progress Notes (Signed)
Subjective:    Patient ID: Scott Ashley, male    DOB: January 13, 1930, 87 y.o.   MRN: [id number]  Scott Ashley is a 87 y.o. male presenting on 05/29/2020 for Annual Exam   HPI   Accompanied by granddaughter [name] who provides additional history.   CHRONIC HTNwith CKD-III / History of Left Renal Mass Labs, creatinine stable. Reportsno new concerns. Checking BPregularly. He is drinking plenty of water. He does not take NSAIDs. Takes Tylenol PRN. He is followed by Dr Diamantina Providence at [facility] for L Renal Mass upcoming visit. Current Meds -Bisoprolol-HCTZ 5-6.25mg  Reports good compliance, took meds today. Tolerating well, w/o complaints. Denies CP, dyspnea, HA, edema, dizziness / lightheadedness  History of H Pylori Gastric /Abnormal bowel habits - He was on dual antibiotic therapy, sensitive stomachfrom antibiotics. Has improved - S/p Colonoscopy 10/2017 [facility]  HYPERLIPIDEMIA: Had possible myalgia reaction on STATIN. Had stopped then restarted again. Taking Rosuvastatin 5mg  TWICE a week only Lipid improved still controlled on intermittent statin. No more myalgia or side effects Off ASA  Constipation GERD improved. On PPI 20mg  daily Occaisonal abdominal pain - usually after eats Taking Stool softener, daily, but not always effective Has miralax, not taking.  BPHLUTS / Nocturia Followed by Urology [facility] Dr Diamantina Providence, next apt 06/10/20 He is taking OTC Prostagenix and Tamsulosin 0.4mg  daily - good results overall. Occasional nocturia still maybe 1-3 x, overall improved.  Insomnia Reports chronic problem, with some difficulty falling asleep at times. He can occasionally sleep in late and then has difficulty falling asleep at night sometimes. Now improved reports better sleep on med. - He takes Mirtazapine 15mg  nightly (previously was on 7.5mg )   Health Maintenance: Patient declines COVID19 vaccine at this time. Counseling provided.   Depression screen [facility] 2/9  11/27/2019 05/28/2019 04/09/2019  Decreased Interest 0 0 0  Down, Depressed, Hopeless 0 0 0  PHQ - 2 Score 0 0 0  Altered sleeping - - -  Tired, decreased energy - - -  Change in appetite - - -  Feeling bad or failure about yourself  - - -  Trouble concentrating - - -  Moving slowly or fidgety/restless - - -  Suicidal thoughts - - -  PHQ-9 Score - - -    Past Medical History:  Diagnosis Date  . Anxiety   . Glaucoma   . Hypertension   . Urinary incontinence    Past Surgical History:  Procedure Laterality Date  . COLONOSCOPY WITH PROPOFOL N/A 10/10/2017   Procedure: COLONOSCOPY WITH PROPOFOL;  Surgeon: Lucilla Lame, MD;  Location: [facility] ENDOSCOPY;  Service: Endoscopy;  Laterality: N/A;  . ESOPHAGOGASTRODUODENOSCOPY (EGD) WITH PROPOFOL N/A 10/10/2017   Procedure: ESOPHAGOGASTRODUODENOSCOPY (EGD) WITH PROPOFOL;  Surgeon: Lucilla Lame, MD;  Location: [facility] ENDOSCOPY;  Service: Endoscopy;  Laterality: N/A;  . HERNIA REPAIR     twice  . TONSILLECTOMY     Social History   Socioeconomic History  . Marital status: Widowed    Spouse name: Not on file  . Number of children: Not on file  . Years of education: [employer]  . Highest education level: High school graduate  Occupational History  . Not on file  Tobacco Use  . Smoking status: Never Smoker  . Smokeless tobacco: Never Used  Substance and Sexual Activity  . Alcohol use: Never  . Drug use: Never  . Sexual activity: Not Currently    Birth control/protection: None  Other Topics Concern  . Not on file  Social  History Narrative  . Not on file   Social Determinants of Health   Financial Resource Strain: Not on file  Food Insecurity: Not on file  Transportation Needs: Not on file  Physical Activity: Not on file  Stress: Not on file  Social Connections: Not on file  Intimate Partner Violence: Not on file   Family History  Problem Relation Age of Onset  . ALS Mother    Current Outpatient Medications on File Prior to Visit   Medication Sig  . acetaminophen (TYLENOL) 500 MG tablet Take 500 mg by mouth every 6 (six) hours as needed.  . bisoprolol-hydrochlorothiazide (ZIAC) 5-6.25 MG tablet TAKE 1 TABLET BY MOUTH DAILY  . ketoconazole (NIZORAL) 2 % cream Apply 1 application topically daily. qhs to feet, legs, buttocks and back  . latanoprost (XALATAN) 0.005 % ophthalmic solution INT 1 GTT IN OU QHS  . mirtazapine (REMERON) 15 MG tablet TAKE 1 TABLET(15 MG) BY MOUTH AT BEDTIME  . omeprazole (PRILOSEC) 20 MG capsule Take 1 capsule by mouth daily.  . rosuvastatin (CRESTOR) 5 MG tablet Take 5 mg by mouth 2 (two) times a week.  [employer] Vitamins Products (PROSTATE PO) Take by mouth.  . tamsulosin (FLOMAX) 0.4 MG CAPS capsule TAKE 1 CAPSULE BY MOUTH EVERY DAY 30 MINUTES AFTER THE SAME MEAL   No current facility-administered medications on file prior to visit.    Review of Systems  Constitutional: Negative for activity change, appetite change, chills, diaphoresis, fatigue and fever.  HENT: Negative for congestion and hearing loss.   Eyes: Negative for visual disturbance.  Respiratory: Negative for cough, chest tightness, shortness of breath and wheezing.   Cardiovascular: Negative for chest pain, palpitations and leg swelling.  Gastrointestinal: Negative for abdominal pain, constipation, diarrhea, nausea and vomiting.  Endocrine: Negative for cold intolerance.  Genitourinary: Negative for dysuria, frequency and hematuria.  Musculoskeletal: Negative for arthralgias and neck pain.  Skin: Negative for rash.  Allergic/Immunologic: Negative for environmental allergies.  Neurological: Negative for dizziness, weakness, light-headedness, numbness and headaches.  Hematological: Negative for adenopathy.  Psychiatric/Behavioral: Negative for behavioral problems, dysphoric mood and sleep disturbance.   Per HPI unless specifically indicated above     Objective:    BP 124/64 (BP Location: Left Arm, Patient Position:  Sitting, Cuff Size: Normal)   Pulse 71   Ht 5\' 11"  (1.803 m)   Wt 170 lb 3.2 oz (77.2 kg)   SpO2 100%   BMI 23.74 kg/m   Wt Readings from Last 3 Encounters:  05/29/20 170 lb 3.2 oz (77.2 kg)  05/11/20 173 lb (78.5 kg)  11/27/19 171 lb 12.8 oz (77.9 kg)    Physical Exam Vitals and nursing note reviewed.  Constitutional:      General: He is not in acute distress.    Appearance: He is well-developed. He is not diaphoretic.     Comments: Well-appearing, comfortable, cooperative  HENT:     Head: Normocephalic and atraumatic.  Eyes:     General:        Right eye: No discharge.        Left eye: No discharge.     Conjunctiva/sclera: Conjunctivae normal.     Pupils: Pupils are equal, round, and reactive to light.  Neck:     Thyroid: No thyromegaly.     Vascular: No carotid bruit.  Cardiovascular:     Rate and Rhythm: Normal rate and regular rhythm.     Heart sounds: Normal heart sounds. No murmur heard.   Pulmonary:  Effort: Pulmonary effort is normal. No respiratory distress.     Breath sounds: Normal breath sounds. No wheezing or rales.  Abdominal:     General: Bowel sounds are normal. There is no distension.     Palpations: Abdomen is soft. There is no mass.     Tenderness: There is no abdominal tenderness.  Musculoskeletal:        General: No tenderness. Normal range of motion.     Cervical back: Normal range of motion and neck supple.     Right lower leg: No edema.     Left lower leg: No edema.     Comments: Upper / Lower Extremities: - Normal muscle tone, strength bilateral upper extremities 5/5, lower extremities 5/5  Lymphadenopathy:     Cervical: No cervical adenopathy.  Skin:    General: Skin is warm and dry.     Findings: No erythema or rash.  Neurological:     Mental Status: He is alert and oriented to person, place, and time.     Comments: Distal sensation intact to light touch all extremities  Psychiatric:        Behavior: Behavior normal.      Comments: Well groomed, good eye contact, normal speech and thoughts       Results for orders placed or performed in visit on 05/22/20  TSH  Result Value Ref Range   TSH 5.80 (H) 0.40 - 4.50 mIU/L  PSA  Result Value Ref Range   PSA 3.00 < OR = 4.0 ng/mL  Lipid panel  Result Value Ref Range   Cholesterol 156 <200 mg/dL   HDL 49 > OR = 40 mg/dL   Triglycerides 71 <150 mg/dL   LDL Cholesterol (Calc) 91 mg/dL (calc)   Total CHOL/HDL Ratio 3.2 <5.0 (calc)   Non-HDL Cholesterol (Calc) 107 <130 mg/dL (calc)  COMPLETE METABOLIC PANEL WITH GFR  Result Value Ref Range   Glucose, Bld 96 65 - 99 mg/dL   BUN 15 7 - 25 mg/dL   Creat 1.13 (H) 0.70 - 1.11 mg/dL   GFR, Est Non African American 57 (L) > OR = 60 mL/min/1.1m2   GFR, Est African American 66 > OR = 60 mL/min/1.38m2   BUN/Creatinine Ratio 13 6 - 22 (calc)   Sodium 140 135 - 146 mmol/L   Potassium 3.6 3.5 - 5.3 mmol/L   Chloride 100 98 - 110 mmol/L   CO2 30 20 - 32 mmol/L   Calcium 9.9 8.6 - 10.3 mg/dL   Total Protein 7.1 6.1 - 8.1 g/dL   Albumin 4.3 3.6 - 5.1 g/dL   Globulin 2.8 1.9 - 3.7 g/dL (calc)   AG Ratio 1.5 1.0 - 2.5 (calc)   Total Bilirubin 0.7 0.2 - 1.2 mg/dL   Alkaline phosphatase (APISO) 62 35 - 144 U/L   AST 16 10 - 35 U/L   ALT 4 (L) 9 - 46 U/L  CBC with Differential/Platelet  Result Value Ref Range   WBC 5.0 3.8 - 10.8 Thousand/uL   RBC 5.29 4.20 - 5.80 Million/uL   Hemoglobin 14.2 13.2 - 17.1 g/dL   HCT 44.4 38.5 - 50.0 %   MCV 83.9 80.0 - 100.0 fL   MCH 26.8 (L) 27.0 - 33.0 pg   MCHC 32.0 32.0 - 36.0 g/dL   RDW 13.6 11.0 - 15.0 %   Platelets 117 (L) 140 - 400 Thousand/uL   MPV 12.5 7.5 - 12.5 fL   Neutro Abs 2,725 1,500 - 7,800 cells/uL  Lymphs Abs 1,500 850 - 3,900 cells/uL   Absolute Monocytes 505 200 - 950 cells/uL   Eosinophils Absolute 220 15 - 500 cells/uL   Basophils Absolute 50 0 - 200 cells/uL   Neutrophils Relative % 54.5 %   Total Lymphocyte 30.0 %   Monocytes Relative 10.1 %    Eosinophils Relative 4.4 %   Basophils Relative 1.0 %  Hemoglobin A1c  Result Value Ref Range   Hgb A1c MFr Bld 5.6 <5.7 % of total Hgb   Mean Plasma Glucose 114 mg/dL   eAG (mmol/L) 6.3 mmol/L      Assessment & Plan:   Problem List Items Addressed This Visit    Pure hypercholesterolemia    Controlled cholesterol on lifestyle and intermittent statin now Last lipid panel 05/2020, LDL controlled Myalgia on regular dosing statin Rosvau 5-10mg  OFF ASA 81 due to bruising bleeding  Plan: 1. Continue intermittent STATIN - Rosuvastatin 5mg  twice a week Encourage improved lifestyle - low carb/cholesterol, reduce portion size, continue improving regular exercise      Insomnia    Improved control on med management Chronic problem Some sleep cycle issues with sleeping in late morning worsen sleep  Continue current Mirtazapine 15mg  daily, no significant depression associated. Seems to be effective for his sleep onset.      Elevated TSH    Likely subclinical at this time, asymptomatic Mild elevated TSH 5.8 Check labs in 6 months TSH + Free T4       CKD (chronic kidney disease), stage III    Stable, see HTN A&P      BPH with obstruction/lower urinary tract symptoms    Controlled Followed by [facility] Urology Dr Diamantina Providence On Flomax 0.4mg  daily and prostate supplement OTC  PSA 3.0, appropriate for age and BPH. F/u urology      Benign hypertension with CKD (chronic kidney disease) stage III (HCC)    Well-controlled HTN Complication with CKD-III, mild improved Cr on lab    Plan:  1. Continue current BP regimen - Bisoprolol-HCTZ 5-6.25mg  2. Encourage improved lifestyle - low sodium diet, regular exercise 3. Continue monitor BP outside office, bring readings to next visit, if persistently >140/90 or new symptoms notify office sooner       Other Visit Diagnoses    Annual physical exam    -  Primary      Updated Health Maintenance information Reviewed recent lab results with  patient Encouraged improvement to lifestyle with diet and exercise Goal maintain healthy weight  No orders of the defined types were placed in this encounter.    Follow up plan: Return in about 6 months (around 11/28/2020) for 6 month fasting lab only then 1 week later possible Virtual Visit if abnormal result.   Follow-up TSH T4, BMET 11/2020   [name], [facility] 05/29/2020, 11:03 AM

## 2020-05-29 NOTE — Assessment & Plan Note (Signed)
Controlled cholesterol on lifestyle and intermittent statin now Last lipid panel 05/2020, LDL controlled Myalgia on regular dosing statin Rosvau 5-10mg  OFF ASA 81 due to bruising bleeding  Plan: 1. Continue intermittent STATIN - Rosuvastatin 5mg  twice a week Encourage improved lifestyle - low carb/cholesterol, reduce portion size, continue improving regular exercise

## 2020-05-29 NOTE — Assessment & Plan Note (Signed)
Likely subclinical at this time, asymptomatic Mild elevated TSH 5.8 Check labs in 6 months TSH + Free T4

## 2020-05-29 NOTE — Assessment & Plan Note (Signed)
Stable, see HTN A&P

## 2020-05-29 NOTE — Patient Instructions (Addendum)
Thank you for coming to the office today.  Mild Elevated TSH thyroid 5.8, we will re-check lab in 6 months to compare.  For Constipation (less frequent bowel movement that can be hard dry or involve straining).  Recommend trying OTC Miralax 17g = 1 capful in large glass water once daily for now, try several days to see if working, goal is soft stool or BM 1-2 times daily, if too loose then reduce dose or try every other day. If not effective may need to increase it to 2 doses at once in AM or may do 1 in morning and 1 in afternoon/evening  - This medicine is very safe and can be used often without any problem and will not make you dehydrated. It is good for use on AS NEEDED BASIS or even MAINTENANCE therapy for longer term for several days to weeks at a time to help regulate bowel movements  Other more natural remedies or preventative treatment: - Increase hydration with water - Increase fiber in diet (high fiber foods = vegetables, leafy greens, oats/grains) - May take OTC Fiber supplement (metamucil powder or pill/gummy) - May try OTC Probiotic    DUE for FASTING BLOOD WORK (no food or drink after midnight before the lab appointment, only water or coffee without cream/sugar on the morning of)  SCHEDULE "Lab Only" visit in the morning at the clinic for lab draw in 6 MONTHS   - Make sure Lab Only appointment is at about 1 week before your next appointment, so that results will be available  For Lab Results, once available within 2-3 days of blood draw, you can can log in to MyChart online to view your results and a brief explanation. Also, we can discuss results at next follow-up visit.     Please schedule a Follow-up Appointment to: Return in about 6 months (around 11/28/2020) for 6 month fasting lab only then 1 week later possible Virtual Visit if abnormal result.  If you have any other questions or concerns, please feel free to call the office or send a message through [place]. You  may also schedule an earlier appointment if necessary.  Additionally, you may be receiving a survey about your experience at our office within a few days to 1 week by e-mail or mail. We value your feedback.  [name], DO [place]   Hypothyroidism  Hypothyroidism is when the thyroid gland does not make enough of certain hormones (it is underactive). The thyroid gland is a small gland located in the lower front part of the neck, just in front of the windpipe (trachea). This gland makes hormones that help control how the body uses food for energy (metabolism) as well as how the heart and brain function. These hormones also play a role in keeping your bones strong. When the thyroid is underactive, it produces too little of the hormones thyroxine (T4) and triiodothyronine (T3). What are the causes? This condition may be caused by:  Hashimoto's disease. This is a disease in which the body's disease-fighting system (immune system) attacks the thyroid gland. This is the most common cause.  Viral infections.  Pregnancy.  Certain medicines.  Birth defects.  Past radiation treatments to the head or neck for cancer.  Past treatment with radioactive iodine.  Past exposure to radiation in the environment.  Past surgical removal of part or all of the thyroid.  Problems with a gland in the center of the brain (pituitary gland).  Lack of enough  iodine in the diet. What increases the risk? You are more likely to develop this condition if:  You are male.  You have a family history of thyroid conditions.  You use a medicine called lithium.  You take medicines that affect the immune system (immunosuppressants). What are the signs or symptoms? Symptoms of this condition include:  Feeling as though you have no energy (lethargy).  Not being able to tolerate cold.  Weight gain that is not explained by a change in diet or exercise habits.  Lack of  appetite.  Dry skin.  Coarse hair.  Menstrual irregularity.  Slowing of thought processes.  Constipation.  Sadness or depression. How is this diagnosed? This condition may be diagnosed based on:  Your symptoms, your medical history, and a physical exam.  Blood tests. You may also have imaging tests, such as an ultrasound or MRI. How is this treated? This condition is treated with medicine that replaces the thyroid hormones that your body does not make. After you begin treatment, it may take several weeks for symptoms to go away. Follow these instructions at home:  Take over-the-counter and prescription medicines only as told by your health care provider.  If you start taking any new medicines, tell your health care provider.  Keep all follow-up visits as told by your health care provider. This is important. ? As your condition improves, your dosage of thyroid hormone medicine may change. ? You will need to have blood tests regularly so that your health care provider can monitor your condition. Contact a health care provider if:  Your symptoms do not get better with treatment.  You are taking thyroid hormone replacement medicine and you: ? Sweat a lot. ? Have tremors. ? Feel anxious. ? Lose weight rapidly. ? Cannot tolerate heat. ? Have emotional swings. ? Have diarrhea. ? Feel weak. Get help right away if you have:  Chest pain.  An irregular heartbeat.  A rapid heartbeat.  Difficulty breathing. Summary  Hypothyroidism is when the thyroid gland does not make enough of certain hormones (it is underactive).  When the thyroid is underactive, it produces too little of the hormones thyroxine (T4) and triiodothyronine (T3).  The most common cause is Hashimoto's disease, a disease in which the body's disease-fighting system (immune system) attacks the thyroid gland. The condition can also be caused by viral infections, medicine, pregnancy, or past radiation  treatment to the head or neck.  Symptoms may include weight gain, dry skin, constipation, feeling as though you do not have energy, and not being able to tolerate cold.  This condition is treated with medicine to replace the thyroid hormones that your body does not make. This information is not intended to replace advice given to you by your health care provider. Make sure you discuss any questions you have with your health care provider. Document Revised: 10/25/2019 Document Reviewed: 10/10/2019 Elsevier Patient Education  2021 [employer].

## 2020-05-29 NOTE — Assessment & Plan Note (Signed)
Well-controlled HTN Complication with CKD-III, mild improved Cr on lab    Plan:  1. Continue current BP regimen - Bisoprolol-HCTZ 5-6.25mg 2. Encourage improved lifestyle - low sodium diet, regular exercise 3. Continue monitor BP outside office, bring readings to next visit, if persistently >140/90 or new symptoms notify office sooner 

## 2020-06-02 ENCOUNTER — Ambulatory Visit: Payer: Medicare Other

## 2020-06-03 ENCOUNTER — Telehealth: Payer: Self-pay

## 2020-06-03 NOTE — Telephone Encounter (Signed)
Patient would like to know what he can take over the counter for sinus drainage, cough an sore throat. CB [phone]  granddaughter  Page

## 2020-06-03 NOTE — Telephone Encounter (Signed)
Currently we are seeing a lot of allergy induced symptoms similar to these.  I would recommend any combination of the following:  Start nasal steroid OTC Fluticasone or Flonase 2 sprays in each nostril daily for 4-6 weeks, may repeat course seasonally or as needed   Start Claritin / Zyrtec or generics 10mg  daily for anti histamine.  He may do a short course of cough/decongetant if he prefers with a Mucinex-DM.  Plain Mucinex twice a day without the DM for cough will be safest for avoiding any blood pressure issue - but short course would be okay if it is needed for cough.  [name], [facility] 06/03/2020, 12:05 PM

## 2020-06-03 NOTE — Telephone Encounter (Signed)
Sent and replied to patients my chart message with this information.

## 2020-06-03 NOTE — Telephone Encounter (Signed)
Sore throat, cough ,congestion [phone]

## 2020-06-09 ENCOUNTER — Ambulatory Visit (INDEPENDENT_AMBULATORY_CARE_PROVIDER_SITE_OTHER): Payer: Medicare Other

## 2020-06-09 ENCOUNTER — Telehealth (INDEPENDENT_AMBULATORY_CARE_PROVIDER_SITE_OTHER): Payer: Medicare Other | Admitting: Family Medicine

## 2020-06-09 ENCOUNTER — Other Ambulatory Visit: Payer: Self-pay

## 2020-06-09 ENCOUNTER — Encounter: Payer: Self-pay | Admitting: Family Medicine

## 2020-06-09 VITALS — Ht 71.0 in | Wt 170.0 lb

## 2020-06-09 DIAGNOSIS — Z0000 Encounter for general adult medical examination without abnormal findings: Secondary | ICD-10-CM | POA: Diagnosis not present

## 2020-06-09 DIAGNOSIS — J0110 Acute frontal sinusitis, unspecified: Secondary | ICD-10-CM

## 2020-06-09 MED ORDER — AMOXICILLIN-POT CLAVULANATE 875-125 MG PO TABS: 1.0000 | ORAL_TABLET | Freq: Two times a day (BID) | ORAL | 0 refills | Status: DC

## 2020-06-09 NOTE — Patient Instructions (Signed)
Start augmentin for sinuses. Start nasal steroid Flonase 2 sprays in each nostril daily for 4-6 weeks, may repeat course seasonally or as needed If not improving can add Steroid course.  Please schedule a Follow-up Appointment to: No follow-ups on file.  If you have any other questions or concerns, please feel free to call the office or send a message through [place]. You may also schedule an earlier appointment if necessary.  Additionally, you may be receiving a survey about your experience at our office within a few days to 1 week by e-mail or mail. We value your feedback.  Scott Putnam, DO [place]

## 2020-06-09 NOTE — Progress Notes (Addendum)
Subjective:    Patient ID: Scott Ashley, male    DOB: January 24, 1930, 87 y.o.   MRN: [id number]  LENIS NETTLETON is a 87 y.o. male presenting on 06/09/2020 for Cough and Facial Pain  Virtual / Telehealth Encounter - Video Visit via MyChart The purpose of this virtual visit is to provide medical care while limiting exposure to the novel coronavirus (COVID19) for both patient and office staff.  Consent was obtained for remote visit:  Yes.   Answered questions that patient had about telehealth interaction:  Yes.   I discussed the limitations, risks, security and privacy concerns of performing an evaluation and management service by video/telephone. I also discussed with the patient that there may be a patient responsible charge related to this service. The patient expressed understanding and agreed to proceed.  Patient Location: Home Provider Location: [facility] (Office)  Participants in virtual visit: - Patient: Scott Ashley - Granddaughter, [place]: Orinda Kenner, [place] - Provider: Dr Parks Ranger  Patient has also completed AMW today with Kellie Simmering LPN  HPI   Sinusitis Cough Reports symptoms onset >1 week ago with sinus congestion cough. Worse now pressure pain within sinuses. Some drainage in throat. Similar to previous sinus infection treated with amoxicillin in past. Tried OTC Delsym, Mucinex, Flonase nasal Denies any fever chills sweats dyspnea nausea vomiting   Depression screen [facility] 2/9 06/09/2020 11/27/2019 05/28/2019  Decreased Interest 0 0 0  Down, Depressed, Hopeless 0 0 0  PHQ - 2 Score 0 0 0  Altered sleeping - - -  Tired, decreased energy - - -  Change in appetite - - -  Feeling bad or failure about yourself  - - -  Trouble concentrating - - -  Moving slowly or fidgety/restless - - -  Suicidal thoughts - - -  PHQ-9 Score - - -    Social History   Tobacco Use  . Smoking status: Never Smoker  . Smokeless tobacco: Never  Used  Vaping Use  . Vaping Use: Never used  Substance Use Topics  . Alcohol use: Never  . Drug use: Never    Review of Systems Per HPI unless specifically indicated above     Objective:    Ht 5\' 11"  (1.803 m)   Wt 170 lb (77.1 kg)   BMI 23.71 kg/m   Wt Readings from Last 3 Encounters:  06/09/20 170 lb (77.1 kg)  06/09/20 170 lb (77.1 kg)  05/29/20 170 lb 3.2 oz (77.2 kg)    Physical Exam   Note examination was completely remotely via video observation objective data only  Gen - well-appearing, no acute distress or apparent pain, comfortable HEENT - eyes appear clear without discharge or redness Heart/Lungs - cannot examine virtually - observed no evidence of coughing or labored breathing. Abd - cannot examine virtually  Skin - face visible today- no rash Neuro - awake, alert, oriented Psych - not anxious appearing   Results for orders placed or performed in visit on 05/22/20  TSH  Result Value Ref Range   TSH 5.80 (H) 0.40 - 4.50 mIU/L  PSA  Result Value Ref Range   PSA 3.00 < OR = 4.0 ng/mL  Lipid panel  Result Value Ref Range   Cholesterol 156 <200 mg/dL   HDL 49 > OR = 40 mg/dL   Triglycerides 71 <150 mg/dL   LDL Cholesterol (Calc) 91 mg/dL (calc)   Total CHOL/HDL Ratio 3.2 <5.0 (calc)  Non-HDL Cholesterol (Calc) 107 <130 mg/dL (calc)  COMPLETE METABOLIC PANEL WITH GFR  Result Value Ref Range   Glucose, Bld 96 65 - 99 mg/dL   BUN 15 7 - 25 mg/dL   Creat 1.13 (H) 0.70 - 1.11 mg/dL   GFR, Est Non African American 57 (L) > OR = 60 mL/min/1.6m2   GFR, Est African American 66 > OR = 60 mL/min/1.10m2   BUN/Creatinine Ratio 13 6 - 22 (calc)   Sodium 140 135 - 146 mmol/L   Potassium 3.6 3.5 - 5.3 mmol/L   Chloride 100 98 - 110 mmol/L   CO2 30 20 - 32 mmol/L   Calcium 9.9 8.6 - 10.3 mg/dL   Total Protein 7.1 6.1 - 8.1 g/dL   Albumin 4.3 3.6 - 5.1 g/dL   Globulin 2.8 1.9 - 3.7 g/dL (calc)   AG Ratio 1.5 1.0 - 2.5 (calc)   Total Bilirubin 0.7 0.2 - 1.2  mg/dL   Alkaline phosphatase (APISO) 62 35 - 144 U/L   AST 16 10 - 35 U/L   ALT 4 (L) 9 - 46 U/L  CBC with Differential/Platelet  Result Value Ref Range   WBC 5.0 3.8 - 10.8 Thousand/uL   RBC 5.29 4.20 - 5.80 Million/uL   Hemoglobin 14.2 13.2 - 17.1 g/dL   HCT 44.4 38.5 - 50.0 %   MCV 83.9 80.0 - 100.0 fL   MCH 26.8 (L) 27.0 - 33.0 pg   MCHC 32.0 32.0 - 36.0 g/dL   RDW 13.6 11.0 - 15.0 %   Platelets 117 (L) 140 - 400 Thousand/uL   MPV 12.5 7.5 - 12.5 fL   Neutro Abs 2,725 1,500 - 7,800 cells/uL   Lymphs Abs 1,500 850 - 3,900 cells/uL   Absolute Monocytes 505 200 - 950 cells/uL   Eosinophils Absolute 220 15 - 500 cells/uL   Basophils Absolute 50 0 - 200 cells/uL   Neutrophils Relative % 54.5 %   Total Lymphocyte 30.0 %   Monocytes Relative 10.1 %   Eosinophils Relative 4.4 %   Basophils Relative 1.0 %  Hemoglobin A1c  Result Value Ref Range   Hgb A1c MFr Bld 5.6 <5.7 % of total Hgb   Mean Plasma Glucose 114 mg/dL   eAG (mmol/L) 6.3 mmol/L      Assessment & Plan:   Problem List Items Addressed This Visit   None   Visit Diagnoses    Acute non-recurrent frontal sinusitis    -  Primary   Relevant Medications   fluticasone (FLONASE) 50 MCG/ACT nasal spray   amoxicillin-clavulanate (AUGMENTIN) 875-125 MG tablet      Consistent with acute frontal rhinosinusitis, likely initially viral URI vs allergic rhinitis component with worsening concern for bacterial infection.   Plan: 1 Start Augmentin 875-125mg  PO BID x 10 days 2. Start nasal steroid Flonase 2 sprays in each nostril daily for 4-6 weeks, may repeat course seasonally or as needed 3. Return criteria reviewed   Meds ordered this encounter  Medications  . amoxicillin-clavulanate (AUGMENTIN) 875-125 MG tablet    Sig: Take 1 tablet by mouth 2 (two) times daily.    Dispense:  20 tablet    Refill:  0     Follow up plan: Return if symptoms worsen or fail to improve.   Patient verbalizes understanding with the  above medical recommendations including the limitation of remote medical advice.  Specific follow-up and call-back criteria were given for patient to follow-up or seek medical care more urgently if  needed.  Total duration of direct patient care provided via video conference: 10 minutes  Scott Ashley, [facility] 06/09/2020, 4:19 PM

## 2020-06-09 NOTE — Progress Notes (Signed)
I connected with Redge Gainer today by telephone and verified that I am speaking with the correct person using two identifiers. Location patient: home Location provider: work Persons participating in the virtual visit: Redge Gainer, Glenna Durand LPN.   I discussed the limitations, risks, security and privacy concerns of performing an evaluation and management service by telephone and the availability of in person appointments. I also discussed with the patient that there may be a patient responsible charge related to this service. The patient expressed understanding and verbally consented to this telephonic visit.    Interactive audio and video telecommunications were attempted between this provider and patient, however failed, due to patient having technical difficulties OR patient did not have access to video capability.  We continued and completed visit with audio only.     Vital signs may be patient reported or missing.  Subjective:   Scott Ashley is a 87 y.o. male who presents for Medicare Annual/Subsequent preventive examination.  Review of Systems     Cardiac Risk Factors include: advanced age (>48men, >71 women);hypertension;male gender;sedentary lifestyle     Objective:    Today's Vitals   06/09/20 1513 06/09/20 1514  Weight: 170 lb (77.1 kg)   Height: 5\' 11"  (1.803 m)   PainSc:  1    Body mass index is 23.71 kg/m.  Advanced Directives 06/09/2020 05/28/2019 11/13/2017 10/10/2017  Does Patient Have a Medical Advance Directive? Yes Yes Yes Yes  Type of Paramedic of [place];Living will Living will;Healthcare Power of Attorney Living will [place] in Chart? No - copy requested No - copy requested - No - copy requested    Current Medications (verified) Outpatient Encounter Medications as of 06/09/2020  Medication Sig  . acetaminophen (TYLENOL) 500 MG tablet Take 500 mg by mouth every 6 (six)  hours as needed.  . bisoprolol-hydrochlorothiazide (ZIAC) 5-6.25 MG tablet TAKE 1 TABLET BY MOUTH DAILY  . ketoconazole (NIZORAL) 2 % cream Apply 1 application topically daily. qhs to feet, legs, buttocks and back  . latanoprost (XALATAN) 0.005 % ophthalmic solution INT 1 GTT IN OU QHS  . mirtazapine (REMERON) 15 MG tablet TAKE 1 TABLET(15 MG) BY MOUTH AT BEDTIME  . omeprazole (PRILOSEC) 20 MG capsule Take 1 capsule by mouth daily.  . rosuvastatin (CRESTOR) 5 MG tablet Take 5 mg by mouth 2 (two) times a week.  [employer] Special[employer] Vitamins Products (PROSTATE PO) Take by mouth.  . tamsulosin (FLOMAX) 0.4 MG CAPS capsule TAKE 1 CAPSULE BY MOUTH EVERY DAY 30 MINUTES AFTER THE SAME MEAL   No facility-administered encounter medications on file as of 06/09/2020.    Allergies (verified) Codeine   History: Past Medical History:  Diagnosis Date  . Anxiety   . Glaucoma   . Hypertension   . Urinary incontinence    Past Surgical History:  Procedure Laterality Date  . COLONOSCOPY WITH PROPOFOL N/A 10/10/2017   Procedure: COLONOSCOPY WITH PROPOFOL;  Surgeon: Lucilla Lame, MD;  Location: [facility] ENDOSCOPY;  Service: Endoscopy;  Laterality: N/A;  . ESOPHAGOGASTRODUODENOSCOPY (EGD) WITH PROPOFOL N/A 10/10/2017   Procedure: ESOPHAGOGASTRODUODENOSCOPY (EGD) WITH PROPOFOL;  Surgeon: Lucilla Lame, MD;  Location: [facility] ENDOSCOPY;  Service: Endoscopy;  Laterality: N/A;  . HERNIA REPAIR     twice  . TONSILLECTOMY     Family History  Problem Relation Age of Onset  . ALS Mother    Social History   Socioeconomic History  . Marital status: Widowed  Spouse name: Not on file  . Number of children: Not on file  . Years of education: [employer]  . Highest education level: High school graduate  Occupational History  . Occupation: retired  Tobacco Use  . Smoking status: Never Smoker  . Smokeless tobacco: Never Used  Vaping Use  . Vaping Use: Never used  Substance and Sexual Activity  . Alcohol use: Never  .  Drug use: Never  . Sexual activity: Not Currently    Birth control/protection: None  Other Topics Concern  . Not on file  Social History Narrative  . Not on file   Social Determinants of Health   Financial Resource Strain: Low Risk   . Difficulty of Paying Living Expenses: Not hard at all  Food Insecurity: No Food Insecurity  . Worried About Charity fundraiser in the Last Year: Never true  . Ran Out of Food in the Last Year: Never true  Transportation Needs: No Transportation Needs  . Lack of Transportation (Medical): No  . Lack of Transportation (Non-Medical): No  Physical Activity: Inactive  . Days of Exercise per Week: 0 days  . Minutes of Exercise per Session: 0 min  Stress: No Stress Concern Present  . Feeling of Stress : Not at all  Social Connections: Not on file    Tobacco Counseling Counseling given: Not Answered   Clinical Intake:  Pre-visit preparation completed: Yes  Pain : No/denies pain Pain Score: 1      Nutritional Status: BMI of 19-24  Normal Nutritional Risks: None Diabetes: No  How often do you need to have someone help you when you read instructions, pamphlets, or other written materials from your doctor or pharmacy?: 1 - Never What is the last grade level you completed in school?: 12th grade  Diabetic? no  Interpreter Needed?: No  Information entered by :: NAllen LPN   Activities of Daily Living In your present state of health, do you have any difficulty performing the following activities: 06/09/2020  Hearing? Y  Comment wears hearing aides  Vision? N  Difficulty concentrating or making decisions? N  Walking or climbing stairs? N  Dressing or bathing? N  Doing errands, shopping? N  Preparing Food and eating ? N  Using the Toilet? N  In the past six months, have you accidently leaked urine? N  Do you have problems with loss of bowel control? N  Managing your Medications? N  Managing your Finances? N  Housekeeping or managing your  Housekeeping? N  Some recent data might be hidden    Patient Care Team: Olin Hauser, DO as PCP - General (Family Medicine)  Indicate any recent Medical Services you may have received from other than [facility] providers in the past year (date may be approximate).     Assessment:   This is a routine wellness examination for Scott Ashley.  Hearing/Vision screen No exam data present  Dietary issues and exercise activities discussed: Current Exercise Habits: The patient does not participate in regular exercise at present  Goals Addressed            This Visit's Progress   . Patient Stated       06/09/2020, no goals      Depression Screen PHQ 2/9 Scores 06/09/2020 11/27/2019 05/28/2019 04/09/2019 11/27/2018  PHQ - 2 Score 0 0 0 0 0  PHQ- 9 Score - - - - 0    Fall Risk Fall Risk  06/09/2020 11/27/2019 04/09/2019 11/27/2018  Falls in the  past year? 0 0 0 0  Number falls in past yr: - 0 0 -  Injury with Fall? - 0 0 -  Risk for fall due to : Medication side effect - - -  Follow up Falls evaluation completed;Education provided;Falls prevention discussed Falls evaluation completed Falls evaluation completed -    FALL RISK PREVENTION PERTAINING TO THE HOME:  Any stairs in or around the home? Yes  If so, are there any without handrails? No  Home free of loose throw rugs in walkways, pet beds, electrical cords, etc? Yes  Adequate lighting in your home to reduce risk of falls? Yes   ASSISTIVE DEVICES UTILIZED TO PREVENT FALLS:  Life alert? No  Use of a cane, walker or w/c? No  Grab bars in the bathroom? No  Shower chair or bench in shower? No  Elevated toilet seat or a handicapped toilet? Yes   TIMED UP AND GO:  Was the test performed? No .    Cognitive Function:     6CIT Screen 06/09/2020 05/28/2019  What Year? 0 points 0 points  What month? 0 points 0 points  What time? 0 points 0 points  Count back from 20 0 points 2 points  Months in reverse 0 points 0 points  Repeat  phrase 8 points 0 points  Total Score 8 2    Immunizations  There is no immunization history on file for this patient.  TDAP status: Due, Education has been provided regarding the importance of this vaccine. Advised may receive this vaccine at local pharmacy or Health Dept. Aware to provide a copy of the vaccination record if obtained from local pharmacy or Health Dept. Verbalized acceptance and understanding.  Flu Vaccine status: Declined, Education has been provided regarding the importance of this vaccine but patient still declined. Advised may receive this vaccine at local pharmacy or Health Dept. Aware to provide a copy of the vaccination record if obtained from local pharmacy or Health Dept. Verbalized acceptance and understanding.  Pneumococcal vaccine status: Declined,  Education has been provided regarding the importance of this vaccine but patient still declined. Advised may receive this vaccine at local pharmacy or Health Dept. Aware to provide a copy of the vaccination record if obtained from local pharmacy or Health Dept. Verbalized acceptance and understanding.   Covid-19 vaccine status: Declined, Education has been provided regarding the importance of this vaccine but patient still declined. Advised may receive this vaccine at local pharmacy or Health Dept.or vaccine clinic. Aware to provide a copy of the vaccination record if obtained from local pharmacy or Health Dept. Verbalized acceptance and understanding.  Qualifies for Shingles Vaccine? Yes   Zostavax completed No   Shingrix Completed?: No.    Education has been provided regarding the importance of this vaccine. Patient has been advised to call insurance company to determine out of pocket expense if they have not yet received this vaccine. Advised may also receive vaccine at local pharmacy or Health Dept. Verbalized acceptance and understanding.  Screening Tests Health Maintenance  Topic Date Due  . COVID-19 Vaccine (1)  Never done  . TETANUS/TDAP  Never done  . PNA vac Low Risk Adult (1 of 2 - PCV13) Never done  . INFLUENZA VACCINE  09/07/2020  . HPV VACCINES  Aged Out    Health Maintenance  Health Maintenance Due  Topic Date Due  . COVID-19 Vaccine (1) Never done  . TETANUS/TDAP  Never done  . PNA vac Low Risk Adult (1 of 2 -  PCV13) Never done    Colorectal cancer screening: No longer required.   Lung Cancer Screening: (Low Dose CT Chest recommended if Age 2-80 years, 30 pack-year currently smoking OR have quit w/in 15years.) does not qualify.   Lung Cancer Screening Referral: no  Additional Screening:  Hepatitis C Screening: does not qualify;   Vision Screening: Recommended annual ophthalmology exams for early detection of glaucoma and other disorders of the eye. Is the patient up to date with their annual eye exam?  Yes  Who is the provider or what is the name of the office in which the patient attends annual eye exams? [facility] If pt is not established with a provider, would they like to be referred to a provider to establish care? No .   Dental Screening: Recommended annual dental exams for proper oral hygiene  Community Resource Referral / Chronic Care Management: CRR required this visit?  No   CCM required this visit?  No      Plan:     I have personally reviewed and noted the following in the patient's chart:   . Medical and social history . Use of alcohol, tobacco or illicit drugs  . Current medications and supplements including opioid prescriptions. Patient is not currently taking opioid prescriptions. . Functional ability and status . Nutritional status . Physical activity . Advanced directives . List of other physicians . Hospitalizations, surgeries, and ER visits in previous 12 months . Vitals . Screenings to include cognitive, depression, and falls . Referrals and appointments  In addition, I have reviewed and discussed with patient certain preventive  protocols, quality metrics, and best practice recommendations. A written personalized care plan for preventive services as well as general preventive health recommendations were provided to patient.     Kellie Simmering, LPN   [id number]   Nurse Notes:

## 2020-06-09 NOTE — Patient Instructions (Signed)
Scott Ashley , Thank you for taking time to come for your Medicare Wellness Visit. I appreciate your ongoing commitment to your health goals. Please review the following plan we discussed and let me know if I can assist you in the future.   Screening recommendations/referrals: Colonoscopy: not required Recommended yearly ophthalmology/optometry visit for glaucoma screening and checkup Recommended yearly dental visit for hygiene and checkup  Vaccinations: Influenza vaccine: decline Pneumococcal vaccine: decline Tdap vaccine: decline Shingles vaccine: decline   Covid-19:  decline  Advanced directives: Please bring a copy of your POA (Power of Attorney) and/or Living Will to your next appointment.   Conditions/risks identified: none  Next appointment: Follow up in one year for your annual wellness visit.   Preventive Care 85 Years and Older, Male Preventive care refers to lifestyle choices and visits with your health care provider that can promote health and wellness. What does preventive care include?  A yearly physical exam. This is also called an annual well check.  Dental exams once or twice a year.  Routine eye exams. Ask your health care provider how often you should have your eyes checked.  Personal lifestyle choices, including:  Daily care of your teeth and gums.  Regular physical activity.  Eating a healthy diet.  Avoiding tobacco and drug use.  Limiting alcohol use.  Practicing safe sex.  Taking low doses of aspirin every day.  Taking vitamin and mineral supplements as recommended by your health care provider. What happens during an annual well check? The services and screenings done by your health care provider during your annual well check will depend on your age, overall health, lifestyle risk factors, and family history of disease. Counseling  Your health care provider may ask you questions about your:  Alcohol use.  Tobacco use.  Drug  use.  Emotional well-being.  Home and relationship well-being.  Sexual activity.  Eating habits.  History of falls.  Memory and ability to understand (cognition).  Work and work Statistician. Screening  You may have the following tests or measurements:  Height, weight, and BMI.  Blood pressure.  Lipid and cholesterol levels. These may be checked every 5 years, or more frequently if you are over 85 years old.  Skin check.  Lung cancer screening. You may have this screening every year starting at age 85 if you have a 30-pack-year history of smoking and currently smoke or have quit within the past 15 years.  Fecal occult blood test (FOBT) of the stool. You may have this test every year starting at age 85.  Flexible sigmoidoscopy or colonoscopy. You may have a sigmoidoscopy every 5 years or a colonoscopy every 10 years starting at age 85.  Prostate cancer screening. Recommendations will vary depending on your family history and other risks.  Hepatitis C blood test.  Hepatitis B blood test.  Sexually transmitted disease (STD) testing.  Diabetes screening. This is done by checking your blood sugar (glucose) after you have not eaten for a while (fasting). You may have this done every 1-3 years.  Abdominal aortic aneurysm (AAA) screening. You may need this if you are a current or former smoker.  Osteoporosis. You may be screened starting at age 85 if you are at high risk. Talk with your health care provider about your test results, treatment options, and if necessary, the need for more tests. Vaccines  Your health care provider may recommend certain vaccines, such as:  Influenza vaccine. This is recommended every year.  Tetanus, diphtheria, and acellular  pertussis (Tdap, Td) vaccine. You may need a Td booster every 10 years.  Zoster vaccine. You may need this after age 60.  Pneumococcal 13-valent conjugate (PCV13) vaccine. One dose is recommended after age  85.  Pneumococcal polysaccharide (PPSV23) vaccine. One dose is recommended after age 85. Talk to your health care provider about which screenings and vaccines you need and how often you need them. This information is not intended to replace advice given to you by your health care provider. Make sure you discuss any questions you have with your health care provider. Document Released: 02/20/2015 Document Revised: 10/14/2015 Document Reviewed: 11/25/2014 Elsevier Interactive Patient Education  2017 [place] Prevention in the Home Falls can cause injuries. They can happen to people of all ages. There are many things you can do to make your home safe and to help prevent falls. What can I do on the outside of my home?  Regularly fix the edges of walkways and driveways and fix any cracks.  Remove anything that might make you trip as you walk through a door, such as a raised step or threshold.  Trim any bushes or trees on the path to your home.  Use bright outdoor lighting.  Clear any walking paths of anything that might make someone trip, such as rocks or tools.  Regularly check to see if handrails are loose or broken. Make sure that both sides of any steps have handrails.  Any raised decks and porches should have guardrails on the edges.  Have any leaves, snow, or ice cleared regularly.  Use sand or salt on walking paths during winter.  Clean up any spills in your garage right away. This includes oil or grease spills. What can I do in the bathroom?  Use night lights.  Install grab bars by the toilet and in the tub and shower. Do not use towel bars as grab bars.  Use non-skid mats or decals in the tub or shower.  If you need to sit down in the shower, use a plastic, non-slip stool.  Keep the floor dry. Clean up any water that spills on the floor as soon as it happens.  Remove soap buildup in the tub or shower regularly.  Attach bath mats securely with double-sided  non-slip rug tape.  Do not have throw rugs and other things on the floor that can make you trip. What can I do in the bedroom?  Use night lights.  Make sure that you have a light by your bed that is easy to reach.  Do not use any sheets or blankets that are too big for your bed. They should not hang down onto the floor.  Have a firm chair that has side arms. You can use this for support while you get dressed.  Do not have throw rugs and other things on the floor that can make you trip. What can I do in the kitchen?  Clean up any spills right away.  Avoid walking on wet floors.  Keep items that you use a lot in easy-to-reach places.  If you need to reach something above you, use a strong step stool that has a grab bar.  Keep electrical cords out of the way.  Do not use floor polish or wax that makes floors slippery. If you must use wax, use non-skid floor wax.  Do not have throw rugs and other things on the floor that can make you trip. What can I do with my stairs?  Do not leave any items on the stairs.  Make sure that there are handrails on both sides of the stairs and use them. Fix handrails that are broken or loose. Make sure that handrails are as long as the stairways.  Check any carpeting to make sure that it is firmly attached to the stairs. Fix any carpet that is loose or worn.  Avoid having throw rugs at the top or bottom of the stairs. If you do have throw rugs, attach them to the floor with carpet tape.  Make sure that you have a light switch at the top of the stairs and the bottom of the stairs. If you do not have them, ask someone to add them for you. What else can I do to help prevent falls?  Wear shoes that:  Do not have high heels.  Have rubber bottoms.  Are comfortable and fit you well.  Are closed at the toe. Do not wear sandals.  If you use a stepladder:  Make sure that it is fully opened. Do not climb a closed stepladder.  Make sure that both  sides of the stepladder are locked into place.  Ask someone to hold it for you, if possible.  Clearly mark and make sure that you can see:  Any grab bars or handrails.  First and last steps.  Where the edge of each step is.  Use tools that help you move around (mobility aids) if they are needed. These include:  Canes.  Walkers.  Scooters.  Crutches.  Turn on the lights when you go into a dark area. Replace any light bulbs as soon as they burn out.  Set up your furniture so you have a clear path. Avoid moving your furniture around.  If any of your floors are uneven, fix them.  If there are any pets around you, be aware of where they are.  Review your medicines with your doctor. Some medicines can make you feel dizzy. This can increase your chance of falling. Ask your doctor what other things that you can do to help prevent falls. This information is not intended to replace advice given to you by your health care provider. Make sure you discuss any questions you have with your health care provider. Document Released: 11/20/2008 Document Revised: 07/02/2015 Document Reviewed: 02/28/2014 Elsevier Interactive Patient Education  2017 [employer].

## 2020-06-10 ENCOUNTER — Ambulatory Visit: Payer: Medicare Other | Admitting: Urology

## 2020-06-24 ENCOUNTER — Other Ambulatory Visit: Payer: Self-pay | Admitting: Family Medicine

## 2020-06-24 DIAGNOSIS — E7800 Pure hypercholesterolemia, unspecified: Secondary | ICD-10-CM

## 2020-06-30 DIAGNOSIS — E7800 Pure hypercholesterolemia, unspecified: Secondary | ICD-10-CM

## 2020-06-30 DIAGNOSIS — I10 Essential (primary) hypertension: Secondary | ICD-10-CM

## 2020-06-30 DIAGNOSIS — F5104 Psychophysiologic insomnia: Secondary | ICD-10-CM

## 2020-06-30 MED ORDER — OMEPRAZOLE 20 MG PO CPDR: 1.0000 | DELAYED_RELEASE_CAPSULE | Freq: Every day | ORAL | 1 refills | Status: DC

## 2020-06-30 MED ORDER — MIRTAZAPINE 15 MG PO TABS: ORAL_TABLET | ORAL | 1 refills | Status: DC

## 2020-06-30 MED ORDER — BISOPROLOL-HYDROCHLOROTHIAZIDE 5-6.25 MG PO TABS: 1.0000 | ORAL_TABLET | Freq: Every day | ORAL | 1 refills | Status: DC

## 2020-06-30 MED ORDER — TAMSULOSIN HCL 0.4 MG PO CAPS: 0.4000 mg | ORAL_CAPSULE | Freq: Every day | ORAL | 1 refills | Status: DC

## 2020-06-30 MED ORDER — ROSUVASTATIN CALCIUM 5 MG PO TABS: 5.0000 mg | ORAL_TABLET | ORAL | 3 refills | Status: DC

## 2020-07-08 DIAGNOSIS — H401131 Primary open-angle glaucoma, bilateral, mild stage: Secondary | ICD-10-CM | POA: Diagnosis not present

## 2020-07-08 DIAGNOSIS — H2513 Age-related nuclear cataract, bilateral: Secondary | ICD-10-CM | POA: Diagnosis not present

## 2020-07-20 NOTE — Telephone Encounter (Signed)
Yes Ginger you are correct.  Please disregard the last note about genetic testing since that was obviously for another patient.    I had seen this patient after he had followed up multiple times with Dr. Bonna Gains for second opinion.  I saw him and gave him my opinion for his chronic constipation and have not seen him for his stomach pains that his grandson is referring to.  If his grandchild thinks that he is having gastritis then the treatment of this would be an acid reducer.  As far as the patient being gassy, gassiness is typically caused by something we eat since the GI track does not produce gas only bacteria in the colon does.  If he would like to make an appointment to discuss this new issue since I have only seen him for constipation then please set him up for an office visit.  If you would like to speak to his primary gastroenterologist Dr. Bonna Gains, if she has seen him for abdominal pain and gastritis in the past then that would be another option.

## 2020-08-17 ENCOUNTER — Ambulatory Visit: Payer: Medicare Other | Admitting: Gastroenterology

## 2020-08-17 ENCOUNTER — Other Ambulatory Visit: Payer: Self-pay

## 2020-08-17 ENCOUNTER — Encounter: Payer: Self-pay | Admitting: Gastroenterology

## 2020-08-17 VITALS — BP 110/76 | HR 86 | Temp 97.0°F | Ht 71.0 in | Wt 160.4 lb

## 2020-08-17 DIAGNOSIS — R634 Abnormal weight loss: Secondary | ICD-10-CM

## 2020-08-17 NOTE — Progress Notes (Signed)
Primary Care Physician: Olin Hauser, DO  Primary Gastroenterologist:  Dr. Lucilla Lame  No chief complaint on file.   HPI: Scott Ashley is a 87 y.o. male here with a history of unintentional weight loss and had seen me back in 2019 for an EGD and colonoscopy.  At that time the patient was found to have a hernia and some gastritis.  The patient was also found to have 2 colon polyps. The pathology of those biopsies showed:  DIAGNOSIS:  A. STOMACH; COLD BIOPSY:  - ANTRAL MUCOSA WITH MILD CHRONIC GASTRITIS, NON-SPECIFIC.  - NEGATIVE FOR H. PYLORI, DYSPLASIA, AND MALIGNANCY.   B.  COLON POLYP, ASCENDING; COLD SNARE:  - TUBULAR ADENOMA.  - NEGATIVE FOR HIGH-GRADE DYSPLASIA AND MALIGNANCY.   C.  COLON POLYP, TRANSVERSE; COLD SNARE:  - TUBULAR ADENOMA.  - NEGATIVE FOR HIGH-GRADE DYSPLASIA AND MALIGNANCY.   There is also a benign esophageal stricture seen that was treated with dilation. He has lost 20 lbs in the last few months. He says it stated when he took Dulcolax for constipation.  The patient reports that he feels like he has gastritis again.  The patient also states that his symptoms are bloating after he eats and burping.  He also reports that he has increased gas and thinks he may also be lactose intolerant.  He takes Lactaid pills when he drinks milk products.  Past Medical History:  Diagnosis Date   Anxiety    Glaucoma    Hypertension    Urinary incontinence     Current Outpatient Medications  Medication Sig Dispense Refill   acetaminophen (TYLENOL) 500 MG tablet Take 500 mg by mouth every 6 (six) hours as needed.     amoxicillin-clavulanate (AUGMENTIN) 875-125 MG tablet Take 1 tablet by mouth 2 (two) times daily. 20 tablet 0   bisoprolol-hydrochlorothiazide (ZIAC) 5-6.25 MG tablet Take 1 tablet by mouth daily. 90 tablet 1   fluticasone (FLONASE) 50 MCG/ACT nasal spray Place into both nostrils daily.     ketoconazole (NIZORAL) 2 % cream Apply 1 application  topically daily. qhs to feet, legs, buttocks and back 180 g 11   latanoprost (XALATAN) 0.005 % ophthalmic solution INT 1 GTT IN OU QHS  4   mirtazapine (REMERON) 15 MG tablet TAKE 1 TABLET(15 MG) BY MOUTH AT BEDTIME 90 tablet 1   omeprazole (PRILOSEC) 20 MG capsule Take 1 capsule (20 mg total) by mouth daily. 90 capsule 1   rosuvastatin (CRESTOR) 5 MG tablet Take 1 tablet (5 mg total) by mouth 2 (two) times a week. 45 tablet 3   Specialty Vitamins Products (PROSTATE PO) Take by mouth.     tamsulosin (FLOMAX) 0.4 MG CAPS capsule Take 1 capsule (0.4 mg total) by mouth daily. 90 capsule 1   No current facility-administered medications for this visit.    Allergies as of 08/17/2020 - Review Complete 06/09/2020  Allergen Reaction Noted   Codeine Other (See Comments) 06/10/2016    ROS:  General: Negative for anorexia, weight loss, fever, chills, fatigue, weakness. ENT: Negative for hoarseness, difficulty swallowing , nasal congestion. CV: Negative for chest pain, angina, palpitations, dyspnea on exertion, peripheral edema.  Respiratory: Negative for dyspnea at rest, dyspnea on exertion, cough, sputum, wheezing.  GI: See history of present illness. GU:  Negative for dysuria, hematuria, urinary incontinence, urinary frequency, nocturnal urination.  Endo: Negative for unusual weight change.    Physical Examination:   There were no vitals taken for this visit.  General:  Well-nourished, well-developed in no acute distress.  Eyes: No icterus. Conjunctivae pink. Lungs: Clear to auscultation bilaterally. Non-labored. Heart: Regular rate and rhythm, no murmurs rubs or gallops.  Abdomen: Bowel sounds are normal, nontender, nondistended, no hepatosplenomegaly or masses, no abdominal bruits or hernia , no rebound or guarding.   Extremities: No lower extremity edema. No clubbing or deformities. Neuro: Alert and oriented x 3.  Grossly intact. Skin: Warm and dry, no jaundice.   Psych: Alert and  cooperative, normal mood and affect.  Labs:    Imaging Studies: No results found.  Assessment and Plan:   Scott Ashley is a 87 y.o. y/o male who comes in today with a history of gastritis albeit mild on his last endoscopy. The patient now feels that he is having symptoms again and has also lost some weight because his decreased by mouth intake.  The patient will be set up for a upper GI series.  He will also increase his omeprazole to twice a day to see if that helps his symptoms.  The patient has been explained the plan and agrees with it.     Lucilla Lame, MD. Marval Regal    Note: This dictation was prepared with Dragon dictation along with smaller phrase technology. Any transcriptional errors that result from this process are unintentional.

## 2020-08-25 ENCOUNTER — Ambulatory Visit
Admission: RE | Admit: 2020-08-25 | Discharge: 2020-08-25 | Disposition: A | Discharge status: Home / Self Care | Payer: Medicare Other | Source: Ambulatory Visit | Attending: Gastroenterology | Admitting: Gastroenterology

## 2020-08-25 ENCOUNTER — Other Ambulatory Visit: Payer: Self-pay

## 2020-08-25 ENCOUNTER — Other Ambulatory Visit: Payer: Self-pay | Admitting: Gastroenterology

## 2020-08-25 DIAGNOSIS — R634 Abnormal weight loss: Secondary | ICD-10-CM | POA: Diagnosis not present

## 2020-08-25 DIAGNOSIS — K224 Dyskinesia of esophagus: Secondary | ICD-10-CM | POA: Diagnosis not present

## 2020-08-25 DIAGNOSIS — K219 Gastro-esophageal reflux disease without esophagitis: Secondary | ICD-10-CM | POA: Diagnosis not present

## 2020-08-31 ENCOUNTER — Telehealth: Payer: Self-pay

## 2020-08-31 NOTE — Telephone Encounter (Signed)
Pt's granddaughter has been notified of results.

## 2020-08-31 NOTE — Telephone Encounter (Signed)
-----   Message from Lucilla Lame, MD sent at 08/30/2020 10:09 AM EDT ----- Let the patient know the x-ray showed that the esophagus was not working well to push food down but also showed a narrowing that we can try and dilate to help him swallow better. ----- Message ----- From: Interface, Rad Results In Sent: 08/25/2020  10:28 AM EDT To: Lucilla Lame, MD

## 2020-09-02 ENCOUNTER — Other Ambulatory Visit: Payer: Self-pay

## 2020-09-02 DIAGNOSIS — R1310 Dysphagia, unspecified: Secondary | ICD-10-CM

## 2020-09-24 ENCOUNTER — Ambulatory Visit: Payer: Medicare Other | Admitting: Gastroenterology

## 2020-10-05 ENCOUNTER — Encounter: Payer: Self-pay | Admitting: Gastroenterology

## 2020-10-06 ENCOUNTER — Ambulatory Visit
Admission: RE | Admit: 2020-10-06 | Discharge: 2020-10-06 | Disposition: A | Discharge status: Home / Self Care | Payer: Medicare Other | Attending: Gastroenterology | Admitting: Gastroenterology

## 2020-10-06 ENCOUNTER — Ambulatory Visit: Payer: Medicare Other | Admitting: Anesthesiology

## 2020-10-06 ENCOUNTER — Other Ambulatory Visit: Payer: Self-pay

## 2020-10-06 ENCOUNTER — Encounter
Admission: RE | Disposition: A | Discharge status: Home / Self Care | Payer: Self-pay | Source: Home / Self Care | Attending: Gastroenterology

## 2020-10-06 DIAGNOSIS — Z885 Allergy status to narcotic agent status: Secondary | ICD-10-CM | POA: Insufficient documentation

## 2020-10-06 DIAGNOSIS — Z820 Family history of epilepsy and other diseases of the nervous system: Secondary | ICD-10-CM | POA: Insufficient documentation

## 2020-10-06 DIAGNOSIS — R1310 Dysphagia, unspecified: Secondary | ICD-10-CM | POA: Diagnosis not present

## 2020-10-06 DIAGNOSIS — K449 Diaphragmatic hernia without obstruction or gangrene: Secondary | ICD-10-CM | POA: Diagnosis not present

## 2020-10-06 DIAGNOSIS — K222 Esophageal obstruction: Secondary | ICD-10-CM | POA: Diagnosis not present

## 2020-10-06 DIAGNOSIS — Z79899 Other long term (current) drug therapy: Secondary | ICD-10-CM | POA: Insufficient documentation

## 2020-10-06 HISTORY — PX: ESOPHAGOGASTRODUODENOSCOPY (EGD) WITH PROPOFOL: SHX5813

## 2020-10-06 SURGERY — ESOPHAGOGASTRODUODENOSCOPY (EGD) WITH PROPOFOL
Anesthesia: General

## 2020-10-06 MED ORDER — PROPOFOL 10 MG/ML IV BOLUS
INTRAVENOUS | Status: DC | PRN
  Administered 2020-10-06: 60 mg via INTRAVENOUS
  Administered 2020-10-06: 10 mg via INTRAVENOUS
  Administered 2020-10-06 (×2): 20 mg via INTRAVENOUS

## 2020-10-06 MED ORDER — LIDOCAINE HCL (CARDIAC) PF 100 MG/5ML IV SOSY
PREFILLED_SYRINGE | INTRAVENOUS | Status: DC | PRN
  Administered 2020-10-06: 80 mg via INTRAVENOUS

## 2020-10-06 MED ORDER — SODIUM CHLORIDE 0.9 % IV SOLN: INTRAVENOUS | Status: DC

## 2020-10-06 NOTE — H&P (Signed)
Scott Lame, MD [facility] [address], [place], [place] [address] Phone:[phone] Fax : [phone]  Primary Care Physician:  Scott Hauser, DO Primary Gastroenterologist:  Dr. Allen Ashley  Pre-Procedure History & Physical: HPI:  Scott Ashley is a 87 y.o. male is here for an endoscopy.   Past Medical History:  Diagnosis Date   Anxiety    Glaucoma    Hypertension    Urinary incontinence     Past Surgical History:  Procedure Laterality Date   COLONOSCOPY WITH PROPOFOL N/A 10/10/2017   Procedure: COLONOSCOPY WITH PROPOFOL;  Surgeon: Scott Lame, MD;  Location: [facility] ENDOSCOPY;  Service: Endoscopy;  Laterality: N/A;   ESOPHAGOGASTRODUODENOSCOPY (EGD) WITH PROPOFOL N/A 10/10/2017   Procedure: ESOPHAGOGASTRODUODENOSCOPY (EGD) WITH PROPOFOL;  Surgeon: Scott Lame, MD;  Location: [facility] ENDOSCOPY;  Service: Endoscopy;  Laterality: N/A;   HERNIA REPAIR     twice   TONSILLECTOMY      Prior to Admission medications   Medication Sig Start Date End Date Taking? Authorizing Provider  bisoprolol-hydrochlorothiazide (ZIAC) 5-6.25 MG tablet Take 1 tablet by mouth daily. 06/30/20  Yes Scott Fenton, NP  mirtazapine (REMERON) 15 MG tablet TAKE 1 TABLET(15 MG) BY MOUTH AT BEDTIME 06/30/20  Yes Ashley, Scott Keens, NP  omeprazole (PRILOSEC) 20 MG capsule Take 1 capsule (20 mg total) by mouth daily. 06/30/20  Yes Scott Fenton, NP  rosuvastatin (CRESTOR) 5 MG tablet Take 1 tablet (5 mg total) by mouth 2 (two) times a week. 07/02/20  Yes Scott Fenton, NP  tamsulosin (FLOMAX) 0.4 MG CAPS capsule Take 1 capsule (0.4 mg total) by mouth daily. 06/30/20  Yes Scott Fenton, NP  acetaminophen (TYLENOL) 500 MG tablet Take 500 mg by mouth every 6 (six) hours as needed.    [provider]  amoxicillin-clavulanate (AUGMENTIN) 875-125 MG tablet Take 1 tablet by mouth 2 (two) times daily. Patient not taking: Reported on 10/06/2020 06/09/20   Scott Hauser, DO  fluticasone [facility])  50 MCG/ACT nasal spray Place into both nostrils daily.    [provider]  ketoconazole (NIZORAL) 2 % cream Apply 1 application topically daily. qhs to feet, legs, buttocks and back 01/15/20   Ralene Bathe, MD  latanoprost (XALATAN) 0.005 % ophthalmic solution INT 1 GTT IN OU QHS 05/01/17   [provider]  Specialty Vitamins Products (PROSTATE PO) Take by mouth.    [provider]    Allergies as of 09/02/2020 - Review Complete 08/17/2020  Allergen Reaction Noted   Codeine Other (See Comments) 06/10/2016    Family History  Problem Relation Age of Onset   ALS Mother     Social History   Socioeconomic History   Marital status: Widowed    Spouse name: Not on file   Number of children: Not on file   Years of education: High School   Highest education level: High school graduate  Occupational History   Occupation: retired  Tobacco Use   Smoking status: Never   Smokeless tobacco: Never  Vaping Use   Vaping Use: Never used  Substance and Sexual Activity   Alcohol use: Never   Drug use: Never   Sexual activity: Not Currently    Birth control/protection: None  Other Topics Concern   Not on file  Social History Narrative   Not on file   Social Determinants of Health   Financial Resource Strain: Low Risk    Difficulty of Paying Living Expenses: Not hard at all  Food  Insecurity: No Food Insecurity   Worried About Charity fundraiser in the Last Year: Never true   Ran Out of Food in the Last Year: Never true  Transportation Needs: No Transportation Needs   Lack of Transportation (Medical): No   Lack of Transportation (Non-Medical): No  Physical Activity: Inactive   Days of Exercise per Week: 0 days   Minutes of Exercise per Session: 0 min  Stress: No Stress Concern Present   Feeling of Stress : Not at all  Social Connections: Not on file  Intimate Partner Violence: Not on file    Review of Systems: See HPI, otherwise negative  ROS  Physical Exam: BP (!) 168/86   Pulse 81   Temp 97.6 F (36.4 C) (Temporal)   Resp 18   Ht 6' (1.829 m)   Wt 72.1 kg   SpO2 99%   BMI 21.56 kg/m  General:   Alert,  pleasant and cooperative in NAD Head:  Normocephalic and atraumatic. Neck:  Supple; no masses or thyromegaly. Lungs:  Clear throughout to auscultation.    Heart:  Regular rate and rhythm. Abdomen:  Soft, nontender and nondistended. Normal bowel sounds, without guarding, and without rebound.   Neurologic:  Alert and  oriented x4;  grossly normal neurologically.  Impression/Plan: Scott Ashley is here for an endoscopy to be performed for dysphagia  Risks, benefits, limitations, and alternatives regarding  endoscopy have been reviewed with the patient.  Questions have been answered.  All parties agreeable.   Scott Lame, MD  10/06/2020, 9:10 AM

## 2020-10-06 NOTE — Anesthesia Preprocedure Evaluation (Signed)
Anesthesia Evaluation  Patient identified by MRN, date of birth, ID band Patient awake    Reviewed: Allergy & Precautions, H&P , NPO status , Patient's Chart, lab work & pertinent test results, reviewed documented beta blocker date and time   Airway Mallampati: II   Neck ROM: full    Dental  (+) Poor Dentition   Pulmonary neg pulmonary ROS,    Pulmonary exam normal        Cardiovascular Exercise Tolerance: Good hypertension, On Medications negative cardio ROS Normal cardiovascular exam Rhythm:regular Rate:Normal     Neuro/Psych Anxiety negative neurological ROS  negative psych ROS   GI/Hepatic negative GI ROS, Neg liver ROS,   Endo/Other  negative endocrine ROS  Renal/GU Renal diseasenegative Renal ROS  negative genitourinary   Musculoskeletal   Abdominal   Peds  Hematology negative hematology ROS (+)   Anesthesia Other Findings Past Medical History: No date: Anxiety No date: Glaucoma No date: Hypertension No date: Urinary incontinence Past Surgical History: 10/10/2017: COLONOSCOPY WITH PROPOFOL; N/A     Comment:  Procedure: COLONOSCOPY WITH PROPOFOL;  Surgeon: Lucilla Lame, MD;  Location: [facility] ENDOSCOPY;  Service:               Endoscopy;  Laterality: N/A; 10/10/2017: ESOPHAGOGASTRODUODENOSCOPY (EGD) WITH PROPOFOL; N/A     Comment:  Procedure: ESOPHAGOGASTRODUODENOSCOPY (EGD) WITH               PROPOFOL;  Surgeon: Lucilla Lame, MD;  Location: [facility]               ENDOSCOPY;  Service: Endoscopy;  Laterality: N/A; No date: HERNIA REPAIR     Comment:  twice No date: TONSILLECTOMY   Reproductive/Obstetrics negative OB ROS                             Anesthesia Physical Anesthesia Plan  ASA: 2  Anesthesia Plan: General   Post-op Pain Management:    Induction:   PONV Risk Score and Plan:   Airway Management Planned:   Additional Equipment:   Intra-op Plan:    Post-operative Plan:   Informed Consent: I have reviewed the patients History and Physical, chart, labs and discussed the procedure including the risks, benefits and alternatives for the proposed anesthesia with the patient or authorized representative who has indicated his/her understanding and acceptance.     Dental Advisory Given  Plan Discussed with: CRNA  Anesthesia Plan Comments:         Anesthesia Quick Evaluation

## 2020-10-06 NOTE — Op Note (Signed)
[facility] Gastroenterology Patient Name: Scott Ashley Procedure Date: 10/06/2020 9:12 AM MRN: [id number] Account #: [id number] Date of Birth: 1929/08/04 Admit Type: Outpatient Age: 87 Room: [facility] ENDO ROOM 4 Gender: Male Note Status: Finalized Procedure:             Upper GI endoscopy Indications:           Dysphagia Providers:             Lucilla Lame MD, MD Referring MD:          Olin Hauser (Referring MD) Medicines:             Propofol per Anesthesia Complications:         No immediate complications. Procedure:             Pre-Anesthesia Assessment:                        - Prior to the procedure, a History and Physical was                         performed, and patient medications and allergies were                         reviewed. The patient's tolerance of previous                         anesthesia was also reviewed. The risks and benefits                         of the procedure and the sedation options and risks                         were discussed with the patient. All questions were                         answered, and informed consent was obtained. Prior                         Anticoagulants: The patient has taken no previous                         anticoagulant or antiplatelet agents. ASA Grade                         Assessment: II - A patient with mild systemic disease.                         After reviewing the risks and benefits, the patient                         was deemed in satisfactory condition to undergo the                         procedure.                        After obtaining informed consent, the endoscope was  passed under direct vision. Throughout the procedure,                         the patient's blood pressure, pulse, and oxygen                         saturations were monitored continuously. The Endoscope                         was introduced through the mouth, and advanced to the                          second part of duodenum. The upper GI endoscopy was                         accomplished without difficulty. The patient tolerated                         the procedure well. Findings:      A large hiatal hernia was present.      One benign-appearing, intrinsic moderate stenosis was found at the       gastroesophageal junction. The stenosis was traversed. A TTS dilator was       passed through the scope. Dilation with a 15-16.5-18 mm balloon dilator       was performed to 18 mm. The dilation site was examined following       endoscope reinsertion and showed complete resolution of luminal       narrowing.      The stomach was normal.      The examined duodenum was normal. Impression:            - Large hiatal hernia.                        - Benign-appearing esophageal stenosis. Dilated.                        - Normal stomach.                        - Normal examined duodenum.                        - No specimens collected. Recommendation:        - Discharge patient to home.                        - Resume previous diet.                        - Continue present medications. Procedure Code(s):     --- Professional ---                        [phone], Esophagogastroduodenoscopy, flexible,                         transoral; with transendoscopic balloon dilation of                         esophagus (less than 30 mm diameter) Diagnosis Code(s):     ---  Professional ---                        R13.10, Dysphagia, unspecified                        K22.2, Esophageal obstruction CPT copyright 2019 American Medical Association. All rights reserved. The codes documented in this report are preliminary and upon coder review may  be revised to meet current compliance requirements. Lucilla Lame MD, MD 10/06/2020 9:36:04 AM This report has been signed electronically. Number of Addenda: 0 Note Initiated On: 10/06/2020 9:12 AM Estimated Blood Loss:  Estimated blood loss: none.       [facility]

## 2020-10-06 NOTE — Anesthesia Postprocedure Evaluation (Signed)
Anesthesia Post Note  Patient: Scott Ashley  Procedure(s) Performed: ESOPHAGOGASTRODUODENOSCOPY (EGD) WITH PROPOFOL  Patient location during evaluation: PACU Anesthesia Type: General Level of consciousness: awake and alert Pain management: pain level controlled Vital Signs Assessment: post-procedure vital signs reviewed and stable Respiratory status: spontaneous breathing, nonlabored ventilation, respiratory function stable and patient connected to nasal cannula oxygen Cardiovascular status: blood pressure returned to baseline and stable Postop Assessment: no apparent nausea or vomiting Anesthetic complications: no   No notable events documented.   Last Vitals:  Vitals:   10/06/20 0850 10/06/20 0938  BP: (!) 168/86   Pulse: 81   Resp: 18   Temp: 36.4 C (!) 36.3 C  SpO2: 99%     Last Pain:  Vitals:   10/06/20 0938  TempSrc: Temporal  PainSc: Asleep                 Molli Barrows

## 2020-10-06 NOTE — Transfer of Care (Signed)
Immediate Anesthesia Transfer of Care Note  Patient: Scott Ashley  Procedure(s) Performed: ESOPHAGOGASTRODUODENOSCOPY (EGD) WITH PROPOFOL  Patient Location: PACU  Anesthesia Type:General  Level of Consciousness: sedated  Airway & Oxygen Therapy: Patient Spontanous Breathing  Post-op Assessment: Report given to RN and Post -op Vital signs reviewed and stable  Post vital signs: Reviewed and stable  Last Vitals:  Vitals Value Taken Time  BP    Temp 36.3 C 10/06/20 0938  Pulse 73 10/06/20 0939  Resp 23 10/06/20 0939  SpO2 99 % 10/06/20 0939  Vitals shown include unvalidated device data.  Last Pain:  Vitals:   10/06/20 0938  TempSrc: Temporal  PainSc:          Complications: No notable events documented.

## 2020-10-07 ENCOUNTER — Encounter: Payer: Self-pay | Admitting: Gastroenterology

## 2020-10-26 ENCOUNTER — Other Ambulatory Visit: Payer: Self-pay | Admitting: Internal Medicine

## 2020-11-13 ENCOUNTER — Other Ambulatory Visit: Payer: Self-pay | Admitting: Family Medicine

## 2020-11-13 NOTE — Telephone Encounter (Signed)
Requested Prescriptions  Pending Prescriptions Disp Refills  . omeprazole (PRILOSEC) 20 MG capsule [Pharmacy Med Name: OMEPRAZOLE 20MG  CAPSULES] 90 capsule 1    Sig: TAKE 1 CAPSULE(20 MG) BY MOUTH DAILY BEFORE BREAKFAST     Gastroenterology: Proton Pump Inhibitors Passed - 11/13/2020  7:13 AM      Passed - Valid encounter within last 12 months    Recent Outpatient Visits          5 months ago Acute non-recurrent frontal sinusitis   [place], [place]   5 months ago Annual physical exam   [facility] Olin Hauser, DO   11 months ago Pure hypercholesterolemia   [place], Devonne Doughty, DO   1 year ago Annual physical exam   [facility] Olin Hauser, DO   1 year ago Tinea corporis   [facility] Parks Ranger, Devonne Doughty, DO      Future Appointments            In 2 weeks Parks Ranger, Devonne Doughty, [facility], [place]   In 7 months  [facility] [place], [place]

## 2020-11-14 ENCOUNTER — Other Ambulatory Visit: Payer: Self-pay | Admitting: Internal Medicine

## 2020-11-14 DIAGNOSIS — I10 Essential (primary) hypertension: Secondary | ICD-10-CM

## 2020-11-14 DIAGNOSIS — F5104 Psychophysiologic insomnia: Secondary | ICD-10-CM

## 2020-11-15 NOTE — Telephone Encounter (Signed)
Mail order closed on weekends-

## 2020-11-16 NOTE — Telephone Encounter (Signed)
Future OV 11/27/20.  TC to verify if 10/7's ordered received for [facility] pharmacy tech 10/07's order not received. Approved per protocol for 90 days. Last ordered on 06/30/20 #90 days.

## 2020-11-19 ENCOUNTER — Other Ambulatory Visit: Payer: Self-pay

## 2020-11-19 DIAGNOSIS — R7989 Other specified abnormal findings of blood chemistry: Secondary | ICD-10-CM

## 2020-11-19 DIAGNOSIS — I129 Hypertensive chronic kidney disease with stage 1 through stage 4 chronic kidney disease, or unspecified chronic kidney disease: Secondary | ICD-10-CM

## 2020-11-19 DIAGNOSIS — N183 Chronic kidney disease, stage 3 unspecified: Secondary | ICD-10-CM

## 2020-11-20 ENCOUNTER — Other Ambulatory Visit: Payer: Medicare Other

## 2020-11-20 DIAGNOSIS — N183 Chronic kidney disease, stage 3 unspecified: Secondary | ICD-10-CM | POA: Diagnosis not present

## 2020-11-20 DIAGNOSIS — R7989 Other specified abnormal findings of blood chemistry: Secondary | ICD-10-CM | POA: Diagnosis not present

## 2020-11-20 DIAGNOSIS — I129 Hypertensive chronic kidney disease with stage 1 through stage 4 chronic kidney disease, or unspecified chronic kidney disease: Secondary | ICD-10-CM | POA: Diagnosis not present

## 2020-11-21 LAB — TSH: TSH: 8.89 mIU/L — ABNORMAL HIGH (ref 0.40–4.50)

## 2020-11-21 LAB — BASIC METABOLIC PANEL WITH GFR
BUN: 18 mg/dL (ref 7–25)
CO2: 31 mmol/L (ref 20–32)
Calcium: 10.3 mg/dL (ref 8.6–10.3)
Chloride: 103 mmol/L (ref 98–110)
Creat: 1.18 mg/dL (ref 0.70–1.22)
Glucose, Bld: 101 mg/dL — ABNORMAL HIGH (ref 65–99)
Potassium: 4.3 mmol/L (ref 3.5–5.3)
Sodium: 143 mmol/L (ref 135–146)
eGFR: 59 mL/min/{1.73_m2} — ABNORMAL LOW (ref >=60)

## 2020-11-21 LAB — T4, FREE: Free T4: 1.2 ng/dL (ref 0.8–1.8)

## 2020-11-25 ENCOUNTER — Encounter: Payer: Self-pay | Admitting: Family Medicine

## 2020-11-27 ENCOUNTER — Telehealth (INDEPENDENT_AMBULATORY_CARE_PROVIDER_SITE_OTHER): Payer: Medicare Other | Admitting: Family Medicine

## 2020-11-27 ENCOUNTER — Other Ambulatory Visit: Payer: Self-pay

## 2020-11-27 ENCOUNTER — Other Ambulatory Visit: Payer: Self-pay | Admitting: Family Medicine

## 2020-11-27 ENCOUNTER — Encounter: Payer: Self-pay | Admitting: Family Medicine

## 2020-11-27 VITALS — Ht 72.0 in | Wt 159.0 lb

## 2020-11-27 DIAGNOSIS — N138 Other obstructive and reflux uropathy: Secondary | ICD-10-CM

## 2020-11-27 DIAGNOSIS — R7989 Other specified abnormal findings of blood chemistry: Secondary | ICD-10-CM

## 2020-11-27 DIAGNOSIS — I10 Essential (primary) hypertension: Secondary | ICD-10-CM

## 2020-11-27 DIAGNOSIS — E7800 Pure hypercholesterolemia, unspecified: Secondary | ICD-10-CM

## 2020-11-27 DIAGNOSIS — Z0000 Encounter for general adult medical examination without abnormal findings: Secondary | ICD-10-CM

## 2020-11-27 DIAGNOSIS — R7309 Other abnormal glucose: Secondary | ICD-10-CM

## 2020-11-27 DIAGNOSIS — N183 Chronic kidney disease, stage 3 unspecified: Secondary | ICD-10-CM

## 2020-11-27 NOTE — Progress Notes (Signed)
Virtual Visit via Telephone The purpose of this virtual visit is to provide medical care while limiting exposure to the novel coronavirus (COVID19) for both patient and office staff.  Consent was obtained for phone visit:  Yes.   Answered questions that patient had about telehealth interaction:  Yes.   I discussed the limitations, risks, security and privacy concerns of performing an evaluation and management service by telephone. I also discussed with the patient that there may be a patient responsible charge related to this service. The patient expressed understanding and agreed to proceed.  Patient Location: Home Provider Location: [facility] (Office)  Participants in virtual visit: - Patient: Scott Ashley - CMA: Orinda Kenner, [place] - Provider: Dr Parks Ranger  ---------------------------------------------------------------------- Chief Complaint  Patient presents with   Thyroid Problem    S: Reviewed CMA documentation. I have called patient and gathered additional HPI as follows:  Elevated TSH Subclinical Hypothyroidism Recent lab showed still elevated TSH up from 5.8 now up to 8.89, but normal T4 1.2 Asymptomatic Not on medication and not interested  Denies any fevers, chills, sweats, body ache, cough, shortness of breath, sinus pain or pressure, headache, abdominal pain, diarrhea  Past Medical History:  Diagnosis Date   Anxiety    Glaucoma    Hypertension    Urinary incontinence    Social History   Tobacco Use   Smoking status: Never   Smokeless tobacco: Never  Vaping Use   Vaping Use: Never used  Substance Use Topics   Alcohol use: Never   Drug use: Never    Current Outpatient Medications:    acetaminophen (TYLENOL) 500 MG tablet, Take 500 mg by mouth every 6 (six) hours as needed., Disp: , Rfl:    bisoprolol-hydrochlorothiazide (ZIAC) 5-6.25 MG tablet, TAKE 1 TABLET BY MOUTH  DAILY, Disp: 90 tablet, Rfl: 0   fluticasone (FLONASE)  50 MCG/ACT nasal spray, Place into both nostrils daily., Disp: , Rfl:    ketoconazole (NIZORAL) 2 % cream, Apply 1 application topically daily. qhs to feet, legs, buttocks and back, Disp: 180 g, Rfl: 11   latanoprost (XALATAN) 0.005 % ophthalmic solution, INT 1 GTT IN OU QHS, Disp: , Rfl: 4   mirtazapine (REMERON) 15 MG tablet, TAKE 1 TABLET BY MOUTH AT  BEDTIME, Disp: 90 tablet, Rfl: 0   omeprazole (PRILOSEC) 20 MG capsule, TAKE 1 CAPSULE BY MOUTH  DAILY, Disp: 90 capsule, Rfl: 0   rosuvastatin (CRESTOR) 5 MG tablet, Take 1 tablet (5 mg total) by mouth 2 (two) times a week., Disp: 45 tablet, Rfl: 3   Specialty Vitamins Products (PROSTATE PO), Take by mouth., Disp: , Rfl:    tamsulosin (FLOMAX) 0.4 MG CAPS capsule, TAKE 1 CAPSULE BY MOUTH  DAILY, Disp: 90 capsule, Rfl: 1   amoxicillin-clavulanate (AUGMENTIN) 875-125 MG tablet, Take 1 tablet by mouth 2 (two) times daily. (Patient not taking: No sig reported), Disp: 20 tablet, Rfl: 0  Depression screen [facility] 2/9 06/09/2020 11/27/2019 05/28/2019  Decreased Interest 0 0 0  Down, Depressed, Hopeless 0 0 0  PHQ - 2 Score 0 0 0  Altered sleeping - - -  Tired, decreased energy - - -  Change in appetite - - -  Feeling bad or failure about yourself  - - -  Trouble concentrating - - -  Moving slowly or fidgety/restless - - -  Suicidal thoughts - - -  PHQ-9 Score - - -    No flowsheet data found.  -------------------------------------------------------------------------- O: No  physical exam performed due to remote telephone encounter.  Lab results reviewed.  Recent Results (from the past 2160 hour(s))  T4, free     Status: None   Collection Time: 11/20/20  8:08 AM  Result Value Ref Range   Free T4 1.2 0.8 - 1.8 ng/dL  TSH     Status: Abnormal   Collection Time: 11/20/20  8:08 AM  Result Value Ref Range   TSH 8.89 (H) 0.40 - 4.50 mIU/L  BASIC METABOLIC PANEL WITH GFR     Status: Abnormal   Collection Time: 11/20/20  8:08 AM  Result Value Ref  Range   Glucose, Bld 101 (H) 65 - 99 mg/dL    Comment: .            Fasting reference interval . For someone without known diabetes, a glucose value between 100 and 125 mg/dL is consistent with prediabetes and should be confirmed with a follow-up test. .    BUN 18 7 - 25 mg/dL   Creat 1.18 0.70 - 1.22 mg/dL   eGFR 59 (L) > OR = 60 mL/min/1.71m    Comment: The eGFR is based on the CKD-EPI 2021 equation. To calculate  the new eGFR from a previous Creatinine or Cystatin C result, go to https://www.kidney.org/professionals/ kdoqi/gfr%5Fcalculator    BUN/Creatinine Ratio NOT APPLICABLE 6 - 22 (calc)   Sodium 143 135 - 146 mmol/L   Potassium 4.3 3.5 - 5.3 mmol/L   Chloride 103 98 - 110 mmol/L   CO2 31 20 - 32 mmol/L   Calcium 10.3 8.6 - 10.3 mg/dL    -------------------------------------------------------------------------- A&P:  Problem List Items Addressed This Visit     Elevated TSH - Primary   Asymptomatic Elevated continued TSH up to 8.9 now T4 normal range 1.2 Likely subclinical Never on thyroid medicine Discussion today with granddaughter and patient that I would opt to hold off on starting thyroid medication still and repeat 6 more months if still gradual increasing we can consider a trial on very low dose 12.5 or 25 mcg daily and see if helps. Otherwise would just monitor without treatment.  No orders of the defined types were placed in this encounter.   Follow-up: - Return in 6 months for annual physical / labs  Patient verbalizes understanding with the above medical recommendations including the limitation of remote medical advice.  Specific follow-up and call-back criteria were given for patient to follow-up or seek medical care more urgently if needed.   - Time spent in direct consultation with patient on phone: 7 minutes   ANobie Putnam DBroad [place]Group 11/27/2020, 5:09 PM

## 2020-11-27 NOTE — Patient Instructions (Addendum)
   Please schedule a Follow-up Appointment to: Return in about 6 months (around 05/28/2021) for 6 month fasting lab only in AM then physical 1 week later.  If you have any other questions or concerns, please feel free to call the office or send a message through [place]. You may also schedule an earlier appointment if necessary.  Additionally, you may be receiving a survey about your experience at our office within a few days to 1 week by e-mail or mail. We value your feedback.  [name], DO [place]

## 2021-01-26 ENCOUNTER — Telehealth (INDEPENDENT_AMBULATORY_CARE_PROVIDER_SITE_OTHER): Payer: Medicare Other | Admitting: Family Medicine

## 2021-01-26 ENCOUNTER — Encounter: Payer: Self-pay | Admitting: Family Medicine

## 2021-01-26 VITALS — Ht 72.0 in | Wt 159.0 lb

## 2021-01-26 DIAGNOSIS — F5101 Primary insomnia: Secondary | ICD-10-CM | POA: Diagnosis not present

## 2021-01-26 MED ORDER — TRAZODONE HCL 50 MG PO TABS: 50.0000 mg | ORAL_TABLET | Freq: Every day | ORAL | 2 refills | Status: DC

## 2021-01-26 NOTE — Progress Notes (Signed)
Subjective:    Patient ID: Scott Ashley, male    DOB: 1929/03/29, 87 y.o.   MRN: [id number]  Scott Ashley is a 87 y.o. male presenting on 01/26/2021 for Insomnia  Virtual / Telehealth Encounter - Video Visit via MyChart The purpose of this virtual visit is to provide medical care while limiting exposure to the novel coronavirus (COVID19) for both patient and office staff.  Consent was obtained for remote visit:  Yes.   Answered questions that patient had about telehealth interaction:  Yes.   I discussed the limitations, risks, security and privacy concerns of performing an evaluation and management service by video/telephone. I also discussed with the patient that there may be a patient responsible charge related to this service. The patient expressed understanding and agreed to proceed.  Patient Location: Home Provider Location: [facility] (Office)  Participants in virtual visit: - Patient: Scott Ashley - CMA: Orinda Kenner, CMA - Provider: Dr Parks Ranger   HPI  Insomnia Last visit for this topic 05/2020 - Reports chronic problem, with some difficulty falling asleep at times. He can occasionally sleep in late and then has difficulty falling asleep at night sometimes. - He takes Mirtazapine 80m nightly (previously was on 7.540m - this was started by previous doctor before 2020.  Now worsening insomnia by report. Poor night sleep and has been taking nap in AM.  Currently he reports he goes to bed around 8pm, then falls asleep fairly quickly, only staying asleep for about 2-3 hours, then waking up around 10-11pm and he will frequently get up throughout the night and get a snack. Unable to fall back asleep for 6+ hours, he will end up falling asleep around 6am then will stay asleep until 9-10am.  Taking Mirtazapine 1585mightly. No longer helping.    [facility] 06/09/2020 11/27/2019 05/28/2019  Decreased Interest 0 0 0  Down, Depressed,  Hopeless 0 0 0  PHQ - 2 Score 0 0 0  Altered sleeping - - -  Tired, decreased energy - - -  Change in appetite - - -  Feeling bad or failure about yourself  - - -  Trouble concentrating - - -  Moving slowly or fidgety/restless - - -  Suicidal thoughts - - -  PHQ-9 Score - - -    Social History   Tobacco Use   Smoking status: Never   Smokeless tobacco: Never  Vaping Use   Vaping Use: Never used  Substance Use Topics   Alcohol use: Never   Drug use: Never    Review of Systems Per HPI unless specifically indicated above     Objective:    Ht 6' (1.829 m)    Wt 159 lb (72.1 kg)    BMI 21.56 kg/m   Wt Readings from Last 3 Encounters:  01/26/21 159 lb (72.1 kg)  11/27/20 159 lb (72.1 kg)  10/06/20 159 lb (72.1 kg)    Physical Exam  Note examination was completely remotely via video observation objective data only  Gen - well-appearing, no acute distress or apparent pain, comfortable HEENT - eyes appear clear without discharge or redness Heart/Lungs - cannot examine virtually - observed no evidence of coughing or labored breathing. Abd - cannot examine virtually  Skin - face visible today- no rash Neuro - awake, alert, oriented Psych - not anxious appearing   Results for orders placed or performed in visit on 11/19/20  T4, free  Result Value Ref Range  Free T4 1.2 0.8 - 1.8 ng/dL  TSH  Result Value Ref Range   TSH 8.89 (H) 0.40 - 4.50 mIU/L  BASIC METABOLIC PANEL WITH GFR  Result Value Ref Range   Glucose, Bld 101 (H) 65 - 99 mg/dL   BUN 18 7 - 25 mg/dL   Creat 1.18 0.70 - 1.22 mg/dL   eGFR 59 (L) > OR = 60 mL/min/1.33m   BUN/Creatinine Ratio NOT APPLICABLE 6 - 22 (calc)   Sodium 143 135 - 146 mmol/L   Potassium 4.3 3.5 - 5.3 mmol/L   Chloride 103 98 - 110 mmol/L   CO2 31 20 - 32 mmol/L   Calcium 10.3 8.6 - 10.3 mg/dL      Assessment & Plan:   Problem List Items Addressed This Visit     Insomnia - Primary   Relevant Medications   traZODone  (DESYREL) 50 MG tablet    Taper off Mirtazapine for 2 weeks Take HALF tab nightly for 1 week Take HALF tab every OTHER night for 1 week STOP - then START Trazodone 568mnightly every night. If difficulty coming off mirtazapine and poor sleep, during week 2 you can overlap and take Trazodone and the half pill mirtazapine every other night for a few nights.  Shift sleep schedule, go to bed at 10pm instead of 8pm. Try to keep any morning nap to a minimum and no later than 8am. We need to shift the schedule back, and avoid lengthy morning nap if can't sleep at night that will keep you awake the next night.    Meds ordered this encounter  Medications   traZODone (DESYREL) 50 MG tablet    Sig: Take 1 tablet (50 mg total) by mouth at bedtime.    Dispense:  30 tablet    Refill:  2      Follow up plan: Return if symptoms worsen or fail to improve, for insomnia.  Patient verbalizes understanding with the above medical recommendations including the limitation of remote medical advice.  Specific follow-up and call-back criteria were given for patient to follow-up or seek medical care more urgently if needed.  Total duration of direct patient care provided via video conference: 10 minutes   Scott PutnamDOHaiku-Pauwelaroup 01/26/2021, 4:46 PM

## 2021-01-26 NOTE — Patient Instructions (Addendum)
Thank you for coming to the office today.  Taper off Mirtazapine for 2 weeks Take HALF tab nightly for 1 week Take HALF tab every OTHER night for 1 week STOP - then START Trazodone 50mg  nightly every night. If difficulty coming off mirtazapine and poor sleep, during week 2 you can overlap and take Trazodone and the half pill mirtazapine every other night for a few nights.  Shift sleep schedule, go to bed at 10pm instead of 8pm. Try to keep any morning nap to a minimum and no later than 8am. We need to shift the schedule back, and avoid lengthy morning nap if can't sleep at night that will keep you awake the next night.  Sleep Hygiene Recommendations to promote healthy sleep in all patients, especially if symptoms of insomnia are worsening. Due to the nature of sleep rhythms, if your body gets "out of rhythm", it may take some time before your sleep cycle can be "reset".  Please try to follow as many of the following tips as you can, usually there are only a few of these are the primary cause of the problem.  ?To reset your sleep rhythm, go to bed and get up at the same time every day ?Sleep only long enough to feel rested and then get out of bed ?Do not try to force yourself to sleep. If you can't sleep, get out of bed and try again later. ?Avoid naps during the day, unless excessively tired. The more sleeping during the day, then the less sleep your body needs at night.  ?Have coffee, tea, and other foods that have caffeine only in the morning ?Exercise several days a week, but not right before bed ?If you drink alcohol, prefer to have appropriate drink with one meal, but prefer to avoid alcohol in the evening, and bedtime ?If you smoke, avoid smoking, especially in the evening  ?Avoid watching TV or looking at phones, computers, or reading devices ("e-books") that give off light at least 30 minutes before bed. This artificial light sends "awake signals" to your brain and can make it harder  to fall asleep. ?Make your bedroom a comfortable place where it is easy to fall asleep: Put up shades or special blackout curtains to block light from outside. Use a white noise machine to block noise. Keep the temperature cool. ?Try your best to solve or at least address your problems before you go to bed ?Use relaxation techniques to manage stress. Ask your health care provider to suggest some techniques that may work well for you. These may include: Breathing exercises. Routines to release muscle tension. Visualizing peaceful scenes.    Please schedule a Follow-up Appointment to: Return if symptoms worsen or fail to improve, for insomnia.  If you have any other questions or concerns, please feel free to call the office or send a message through [place]. You may also schedule an earlier appointment if necessary.  Additionally, you may be receiving a survey about your experience at our office within a few days to 1 week by e-mail or mail. We value your feedback.  [name], DO [place]

## 2021-02-07 ENCOUNTER — Other Ambulatory Visit: Payer: Self-pay | Admitting: Internal Medicine

## 2021-02-07 DIAGNOSIS — F5104 Psychophysiologic insomnia: Secondary | ICD-10-CM

## 2021-02-07 DIAGNOSIS — I10 Essential (primary) hypertension: Secondary | ICD-10-CM

## 2021-02-09 NOTE — Telephone Encounter (Signed)
Requested medications are due for refill today.  no  Requested medications are on the active medications list.  no  Last refill. 11/16/2020  Future visit scheduled.   yes  Notes to clinic.  Medication was d/c'd on 01/26/2021.     Requested Prescriptions  Pending Prescriptions Disp Refills   mirtazapine (REMERON) 15 MG tablet [Pharmacy Med Name: Mirtazapine 15 MG Oral Tablet] 90 tablet 3    Sig: TAKE 1 TABLET BY MOUTH AT  BEDTIME     Psychiatry: Antidepressants - mirtazapine Failed - 02/07/2021 10:20 PM      Failed - ALT in normal range and within 360 days    ALT  Date Value Ref Range Status  05/22/2020 4 (L) 9 - 46 U/L Final          Passed - AST in normal range and within 360 days    AST  Date Value Ref Range Status  05/22/2020 16 10 - 35 U/L Final          Passed - Triglycerides in normal range and within 360 days    Triglycerides  Date Value Ref Range Status  05/22/2020 71 <150 mg/dL Final          Passed - Total Cholesterol in normal range and within 360 days    Cholesterol  Date Value Ref Range Status  05/22/2020 156 <200 mg/dL Final          Passed - WBC in normal range and within 360 days    WBC  Date Value Ref Range Status  05/22/2020 5.0 3.8 - 10.8 Thousand/uL Final          Passed - Valid encounter within last 6 months    Recent Outpatient Visits           2 weeks ago Primary insomnia   [place], DO   2 months ago Elevated TSH   [facility] [place], Devonne Doughty, DO   8 months ago Acute non-recurrent frontal sinusitis   [place], DO   8 months ago Annual physical exam   [place], Devonne Doughty, DO   1 year ago Pure hypercholesterolemia   [place], DO       Future Appointments             In 4 months [facility], [facility]             Signed  Prescriptions Disp Refills   bisoprolol-hydrochlorothiazide (ZIAC) 5-6.25 MG tablet 90 tablet 1    Sig: TAKE 1 TABLET BY MOUTH  DAILY     Cardiovascular: Beta Blocker + Diuretic Combos Passed - 02/07/2021 10:20 PM      Passed - K in normal range and within 180 days    Potassium  Date Value Ref Range Status  11/20/2020 4.3 3.5 - 5.3 mmol/L Final          Passed - Na in normal range and within 180 days    Sodium  Date Value Ref Range Status  11/20/2020 143 135 - 146 mmol/L Final          Passed - Cr in normal range and within 180 days    Creat  Date Value Ref Range Status  11/20/2020 1.18 0.70 - 1.22 mg/dL Final          Passed - Ca in normal range and within 180 days  Calcium  Date Value Ref Range Status  11/20/2020 10.3 8.6 - 10.3 mg/dL Final          Passed - Patient is not pregnant      Passed - Last BP in normal range    BP Readings from Last 1 Encounters:  10/06/20 109/63          Passed - Last Heart Rate in normal range    Pulse Readings from Last 1 Encounters:  10/06/20 81          Passed - Valid encounter within last 6 months    Recent Outpatient Visits           2 weeks ago Primary insomnia   [place], DO   2 months ago Elevated TSH   [facility] [place], Devonne Doughty, DO   8 months ago Acute non-recurrent frontal sinusitis   [place], DO   8 months ago Annual physical exam   [facility] Olin Hauser, DO   1 year ago Pure hypercholesterolemia   [place], DO       Future Appointments             In 4 months [facility], [facility]              omeprazole (PRILOSEC) 20 MG capsule 90 capsule 3    Sig: TAKE 1 CAPSULE BY MOUTH  DAILY     Gastroenterology: Proton Pump Inhibitors Passed - 02/07/2021 10:20 PM      Passed - Valid encounter within last 12  months    Recent Outpatient Visits           2 weeks ago Primary insomnia   [place], DO   2 months ago Elevated TSH   [place], DO   8 months ago Acute non-recurrent frontal sinusitis   [facility] [place], Devonne Doughty, DO   8 months ago Annual physical exam   [place], Devonne Doughty, DO   1 year ago Pure hypercholesterolemia   [place], DO       Future Appointments             In 4 months [facility], [facility]

## 2021-02-09 NOTE — Telephone Encounter (Signed)
Requested Prescriptions  Pending Prescriptions Disp Refills   mirtazapine (REMERON) 15 MG tablet [Pharmacy Med Name: Mirtazapine 15 MG Oral Tablet] 90 tablet 3    Sig: TAKE 1 TABLET BY MOUTH AT  BEDTIME     Psychiatry: Antidepressants - mirtazapine Failed - 02/07/2021 10:20 PM      Failed - ALT in normal range and within 360 days    ALT  Date Value Ref Range Status  05/22/2020 4 (L) 9 - 46 U/L Final         Passed - AST in normal range and within 360 days    AST  Date Value Ref Range Status  05/22/2020 16 10 - 35 U/L Final         Passed - Triglycerides in normal range and within 360 days    Triglycerides  Date Value Ref Range Status  05/22/2020 71 <150 mg/dL Final         Passed - Total Cholesterol in normal range and within 360 days    Cholesterol  Date Value Ref Range Status  05/22/2020 156 <200 mg/dL Final         Passed - WBC in normal range and within 360 days    WBC  Date Value Ref Range Status  05/22/2020 5.0 3.8 - 10.8 Thousand/uL Final         Passed - Valid encounter within last 6 months    Recent Outpatient Visits          2 weeks ago Primary insomnia   [place], DO   2 months ago Elevated TSH   [facility] [place], Devonne Doughty, DO   8 months ago Acute non-recurrent frontal sinusitis   [place], DO   8 months ago Annual physical exam   [place], Devonne Doughty, DO   1 year ago Pure hypercholesterolemia   [place], DO      Future Appointments            In 4 months [facility], [place]             bisoprolol-hydrochlorothiazide [facility]) 5-6.25 MG tablet [Pharmacy Med Name: BISO FUM/HCT 5-6.25MG  TABLET] 90 tablet 3    Sig: TAKE 1 TABLET BY MOUTH  DAILY     Cardiovascular: Beta Blocker + Diuretic Combos Passed - 02/07/2021 10:20 PM      Passed - K in normal  range and within 180 days    Potassium  Date Value Ref Range Status  11/20/2020 4.3 3.5 - 5.3 mmol/L Final         Passed - Na in normal range and within 180 days    Sodium  Date Value Ref Range Status  11/20/2020 143 135 - 146 mmol/L Final         Passed - Cr in normal range and within 180 days    Creat  Date Value Ref Range Status  11/20/2020 1.18 0.70 - 1.22 mg/dL Final         Passed - Ca in normal range and within 180 days    Calcium  Date Value Ref Range Status  11/20/2020 10.3 8.6 - 10.3 mg/dL Final         Passed - Patient is not pregnant      Passed - Last BP in normal range    BP Readings from Last 1 Encounters:  10/06/20  109/63         Passed - Last Heart Rate in normal range    Pulse Readings from Last 1 Encounters:  10/06/20 81         Passed - Valid encounter within last 6 months    Recent Outpatient Visits          2 weeks ago Primary insomnia   [place], DO   2 months ago Elevated TSH   [facility] Olin Hauser, DO   8 months ago Acute non-recurrent frontal sinusitis   [place], DO   8 months ago Annual physical exam   [facility] Olin Hauser, DO   1 year ago Pure hypercholesterolemia   [place], DO      Future Appointments            In 4 months [facility], [place]             omeprazole (PRILOSEC) 20 MG capsule [Pharmacy Med Name: Omeprazole 20 MG Oral Capsule Delayed Release] 90 capsule 3    Sig: TAKE 1 CAPSULE BY MOUTH  DAILY     Gastroenterology: Proton Pump Inhibitors Passed - 02/07/2021 10:20 PM      Passed - Valid encounter within last 12 months    Recent Outpatient Visits          2 weeks ago Primary insomnia   [place], DO   2 months ago Elevated TSH   [place], DO   8 months ago Acute non-recurrent frontal sinusitis   [facility] [place], Devonne Doughty, DO   8 months ago Annual physical exam   [facility] Olin Hauser, DO   1 year ago Pure hypercholesterolemia   [place], DO      Future Appointments            In 4 months [facility], [facility]

## 2021-02-10 ENCOUNTER — Other Ambulatory Visit: Payer: Self-pay | Admitting: Internal Medicine

## 2021-02-10 DIAGNOSIS — I10 Essential (primary) hypertension: Secondary | ICD-10-CM

## 2021-02-11 ENCOUNTER — Other Ambulatory Visit: Payer: Self-pay | Admitting: Family Medicine

## 2021-02-11 DIAGNOSIS — F5104 Psychophysiologic insomnia: Secondary | ICD-10-CM

## 2021-02-11 DIAGNOSIS — I10 Essential (primary) hypertension: Secondary | ICD-10-CM

## 2021-02-11 NOTE — Telephone Encounter (Signed)
Ziac last ordered 02/09/21 #90 with one refill-this is per protocol. Mirtazapine 15 mg was discontinued on 01/26/21-not appropriate to refill.

## 2021-02-11 NOTE — Telephone Encounter (Signed)
Both medications were refilled 02/09/2021. Prilosec #90 with 3 refills. Ziac #90 with 1 refill. Requested Prescriptions  Pending Prescriptions Disp Refills   omeprazole (PRILOSEC) 20 MG capsule [Pharmacy Med Name: Omeprazole 20 MG Oral Capsule Delayed Release] 90 capsule 3    Sig: TAKE 1 CAPSULE BY MOUTH  DAILY     Gastroenterology: Proton Pump Inhibitors Passed - 02/10/2021  6:08 AM      Passed - Valid encounter within last 12 months    Recent Outpatient Visits          2 weeks ago Primary insomnia   [place], DO   2 months ago Elevated TSH   [facility] [place], Devonne Doughty, DO   8 months ago Acute non-recurrent frontal sinusitis   [place], DO   8 months ago Annual physical exam   [facility] Olin Hauser, DO   1 year ago Pure hypercholesterolemia   [place], DO      Future Appointments            In 4 months [facility], [place]             bisoprolol-hydrochlorothiazide [facility]) 5-6.25 MG tablet [Pharmacy Med Name: BISO FUM/HCT 5-6.25MG  TABLET] 90 tablet 3    Sig: TAKE 1 TABLET BY MOUTH  DAILY     Cardiovascular: Beta Blocker + Diuretic Combos Passed - 02/10/2021  6:08 AM      Passed - K in normal range and within 180 days    Potassium  Date Value Ref Range Status  11/20/2020 4.3 3.5 - 5.3 mmol/L Final         Passed - Na in normal range and within 180 days    Sodium  Date Value Ref Range Status  11/20/2020 143 135 - 146 mmol/L Final         Passed - Cr in normal range and within 180 days    Creat  Date Value Ref Range Status  11/20/2020 1.18 0.70 - 1.22 mg/dL Final         Passed - Ca in normal range and within 180 days    Calcium  Date Value Ref Range Status  11/20/2020 10.3 8.6 - 10.3 mg/dL Final         Passed - Patient is not pregnant      Passed - Last BP in  normal range    BP Readings from Last 1 Encounters:  10/06/20 109/63         Passed - Last Heart Rate in normal range    Pulse Readings from Last 1 Encounters:  10/06/20 81         Passed - Valid encounter within last 6 months    Recent Outpatient Visits          2 weeks ago Primary insomnia   [place], DO   2 months ago Elevated TSH   [place], DO   8 months ago Acute non-recurrent frontal sinusitis   [place], DO   8 months ago Annual physical exam   [facility] Olin Hauser, DO   1 year ago Pure hypercholesterolemia   [place], Devonne Doughty, DO      Future Appointments  In 4 months [facility], [facility]

## 2021-02-15 ENCOUNTER — Encounter: Payer: Self-pay | Admitting: Family Medicine

## 2021-02-15 DIAGNOSIS — F5101 Primary insomnia: Secondary | ICD-10-CM

## 2021-02-16 MED ORDER — TRAZODONE HCL 50 MG PO TABS: ORAL_TABLET | ORAL | 1 refills | Status: DC

## 2021-03-10 MED ORDER — ZOLPIDEM TARTRATE ER 6.25 MG PO TBCR: 6.2500 mg | EXTENDED_RELEASE_TABLET | Freq: Every evening | ORAL | 0 refills | Status: DC | PRN

## 2021-03-10 NOTE — Addendum Note (Signed)
Addended by: Olin Hauser on: 03/10/2021 05:25 PM   Modules accepted: Orders

## 2021-03-16 MED ORDER — ZOLPIDEM TARTRATE ER 12.5 MG PO TBCR
12.5000 mg | EXTENDED_RELEASE_TABLET | Freq: Every evening | ORAL | 2 refills | Status: DC | PRN
Start: 2021-03-16 — End: 2021-03-16

## 2021-03-16 MED ORDER — ZOLPIDEM TARTRATE ER 12.5 MG PO TBCR: 12.5000 mg | EXTENDED_RELEASE_TABLET | Freq: Every evening | ORAL | 2 refills | Status: DC | PRN

## 2021-03-16 NOTE — Addendum Note (Signed)
Addended by: Olin Hauser on: 03/16/2021 08:50 AM   Modules accepted: Orders

## 2021-03-16 NOTE — Addendum Note (Signed)
Addended by: Olin Hauser on: 03/16/2021 12:29 PM   Modules accepted: Orders

## 2021-03-26 ENCOUNTER — Encounter: Payer: Self-pay | Admitting: Family Medicine

## 2021-03-26 ENCOUNTER — Other Ambulatory Visit: Payer: Self-pay

## 2021-03-26 ENCOUNTER — Ambulatory Visit (INDEPENDENT_AMBULATORY_CARE_PROVIDER_SITE_OTHER): Payer: Medicare Other | Admitting: Family Medicine

## 2021-03-26 VITALS — BP 123/67 | HR 78 | Ht 72.0 in | Wt 154.4 lb

## 2021-03-26 DIAGNOSIS — J0110 Acute frontal sinusitis, unspecified: Secondary | ICD-10-CM | POA: Diagnosis not present

## 2021-03-26 MED ORDER — AMOXICILLIN-POT CLAVULANATE 875-125 MG PO TABS
1.0000 | ORAL_TABLET | Freq: Two times a day (BID) | ORAL | 0 refills | Status: DC
Start: 2021-03-26 — End: 2021-07-22

## 2021-03-26 NOTE — Progress Notes (Signed)
Subjective:    Patient ID: Scott Ashley, male    DOB: 07/13/1929, 88 y.o.   MRN: [id number]  Scott Ashley is a 88 y.o. male presenting on 03/26/2021 for Sinusitis  Here with granddaughter paige  HPI  Sinusitis Reports symptoms onset 9 days ago sinus drainage congestion pressure Admits feeling run down and tired Using sinex OTC nasal spray PRN Denies fever, chills myalgia, nausea vomiting coughing dyspnea   Depression screen [facility] 2/9 06/09/2020 11/27/2019 05/28/2019  Decreased Interest 0 0 0  Down, Depressed, Hopeless 0 0 0  PHQ - 2 Score 0 0 0  Altered sleeping - - -  Tired, decreased energy - - -  Change in appetite - - -  Feeling bad or failure about yourself  - - -  Trouble concentrating - - -  Moving slowly or fidgety/restless - - -  Suicidal thoughts - - -  PHQ-9 Score - - -    Social History   Tobacco Use   Smoking status: Never   Smokeless tobacco: Never  Vaping Use   Vaping Use: Never used  Substance Use Topics   Alcohol use: Never   Drug use: Never    Review of Systems Per HPI unless specifically indicated above     Objective:    BP 123/67    Pulse 78    Ht 6' (1.829 m)    Wt 154 lb 6.4 oz (70 kg)    SpO2 98%    BMI 20.94 kg/m   Wt Readings from Last 3 Encounters:  03/26/21 154 lb 6.4 oz (70 kg)  01/26/21 159 lb (72.1 kg)  11/27/20 159 lb (72.1 kg)    Physical Exam Vitals and nursing note reviewed.  Constitutional:      General: He is not in acute distress.    Appearance: He is well-developed. He is not diaphoretic.     Comments: Well-appearing, comfortable, cooperative  HENT:     Head: Normocephalic and atraumatic.  Eyes:     General:        Right eye: No discharge.        Left eye: No discharge.     Conjunctiva/sclera: Conjunctivae normal.  Neck:     Thyroid: No thyromegaly.  Cardiovascular:     Rate and Rhythm: Normal rate and regular rhythm.     Pulses: Normal pulses.     Heart sounds: Normal heart sounds. No murmur  heard. Pulmonary:     Effort: Pulmonary effort is normal. No respiratory distress.     Breath sounds: Normal breath sounds. No wheezing or rales.  Musculoskeletal:        General: Normal range of motion.     Cervical back: Normal range of motion and neck supple.  Lymphadenopathy:     Cervical: No cervical adenopathy.  Skin:    General: Skin is warm and dry.     Findings: No erythema or rash.  Neurological:     Mental Status: He is alert and oriented to person, place, and time. Mental status is at baseline.  Psychiatric:        Behavior: Behavior normal.     Comments: Well groomed, good eye contact, normal speech and thoughts     Results for orders placed or performed in visit on 11/19/20  T4, free  Result Value Ref Range   Free T4 1.2 0.8 - 1.8 ng/dL  TSH  Result Value Ref Range   TSH 8.89 (H) 0.40 - 4.50 mIU/L  BASIC  METABOLIC PANEL WITH GFR  Result Value Ref Range   Glucose, Bld 101 (H) 65 - 99 mg/dL   BUN 18 7 - 25 mg/dL   Creat 1.18 0.70 - 1.22 mg/dL   eGFR 59 (L) > OR = 60 mL/min/1.25m   BUN/Creatinine Ratio NOT APPLICABLE 6 - 22 (calc)   Sodium 143 135 - 146 mmol/L   Potassium 4.3 3.5 - 5.3 mmol/L   Chloride 103 98 - 110 mmol/L   CO2 31 20 - 32 mmol/L   Calcium 10.3 8.6 - 10.3 mg/dL      Assessment & Plan:   Problem List Items Addressed This Visit   None Visit Diagnoses     Acute non-recurrent frontal sinusitis    -  Primary   Relevant Medications   amoxicillin-clavulanate (AUGMENTIN) 875-125 MG tablet       Consistent with acute frontal rhinosinusitis, likely initially viral URI vs allergic rhinitis component with worsening concern for bacterial infection now x 9 days with second sickening.  Afebrile no respiratory concerns, unlikely covid or flu without other systemic symptoms, and now 9 days later out of window for treatment  Plan: 1. Start Augmentin 875-1284mPO BID x 10 days 2. Keep on OTC nasals saline and sprays, caution with Sinex to avoid  rebound Return criteria reviewed   Meds ordered this encounter  Medications   amoxicillin-clavulanate (AUGMENTIN) 875-125 MG tablet    Sig: Take 1 tablet by mouth 2 (two) times daily.    Dispense:  20 tablet    Refill:  0      Follow up plan: Return if symptoms worsen or fail to improve.  AlNobie PutnamDO[facility] 03/26/2021, 8:09 AM

## 2021-03-26 NOTE — Patient Instructions (Addendum)
Thank you for coming to the office today.  Start Augmentin antibiotic  Limit Sinex to only as needed to avoid rebound swelling.  May use neosporin ointment or vaseline ointment in nose to avoid burning and irritation can help protect the inner layer  Use nasal saline  OTC mucinex as needed  Please schedule a Follow-up Appointment to: Return if symptoms worsen or fail to improve.  If you have any other questions or concerns, please feel free to call the office or send a message through [facility]. You may also schedule an earlier appointment if necessary.  Additionally, you may be receiving a survey about your experience at our office within a few days to 1 week by e-mail or mail. We value your feedback.  [name], DO [place]

## 2021-04-09 ENCOUNTER — Encounter: Payer: Self-pay | Admitting: Family Medicine

## 2021-04-09 DIAGNOSIS — F5101 Primary insomnia: Secondary | ICD-10-CM

## 2021-04-09 MED ORDER — BELSOMRA 10 MG PO TABS
10.0000 mg | ORAL_TABLET | Freq: Every evening | ORAL | 0 refills | Status: DC | PRN
Start: 2021-04-09 — End: 2021-04-12

## 2021-04-12 MED ORDER — BELSOMRA 10 MG PO TABS: 10.0000 mg | ORAL_TABLET | Freq: Every evening | ORAL | 2 refills | Status: DC | PRN

## 2021-04-12 NOTE — Addendum Note (Signed)
Addended by: Olin Hauser on: 04/12/2021 06:06 PM ? ? Modules accepted: Orders ? ?

## 2021-04-13 ENCOUNTER — Telehealth: Payer: Self-pay

## 2021-04-13 NOTE — Telephone Encounter (Signed)
Copied from [place] [phone]. Topic: General - Other ?>> Apr 12, 2021  4:55 PM Tessa Lerner A wrote: ?Reason for CRM: The patient's granddaughter has called to share that they've been in contact with patient's pharmacy  ? ?The patient's newly prescribed medication BELSOMRA 10 MG TABS [[id number]]  cannot be picked up in 15 tablet quantities and requires a minimum of 30 ? ?Please contact patient's granddaughter to discuss modifications ?

## 2021-04-13 NOTE — Telephone Encounter (Signed)
This is a duplicate message. Already handled via MyChart ? ?[name], DO ?[facility] ?[facility] ?04/13/2021, 10:08 AM ? ?

## 2021-04-14 ENCOUNTER — Other Ambulatory Visit: Payer: Self-pay | Admitting: Family Medicine

## 2021-04-15 NOTE — Telephone Encounter (Signed)
Requested Prescriptions  ?Pending Prescriptions Disp Refills  ?? tamsulosin (FLOMAX) 0.4 MG CAPS capsule [Pharmacy Med Name: Tamsulosin HCl 0.4 MG Oral Capsule] 90 capsule 0  ?  Sig: TAKE 1 CAPSULE BY MOUTH  DAILY  ?  ? Urology: Alpha-Adrenergic Blocker Passed - 04/14/2021 10:42 PM  ?  ?  Passed - PSA in normal range and within 360 days  ?  PSA  ?Date Value Ref Range Status  ?05/22/2020 3.00 < OR = 4.0 ng/mL Final  ?  Comment:  ?  The total PSA value from this assay system is  ?standardized against the WHO standard. The test  ?result will be approximately 20% lower when compared  ?to the equimolar-standardized total PSA (Beckman  ?Coulter). Comparison of serial PSA results should be  ?interpreted with this fact in mind. ?. ?This test was performed using the Siemens  ?chemiluminescent method. Values obtained from  ?different assay methods cannot be used ?interchangeably. PSA levels, regardless of ?value, should not be interpreted as absolute ?evidence of the presence or absence of disease. ?  ?   ?  ?  Passed - Last BP in normal range  ?  BP Readings from Last 1 Encounters:  ?03/26/21 123/67  ?   ?  ?  Passed - Valid encounter within last 12 months  ?  Recent Outpatient Visits   ?      ? 2 weeks ago Acute non-recurrent frontal sinusitis  ? [place], DO  ? 2 months ago Primary insomnia  ? [place], DO  ? 4 months ago Elevated TSH  ? [place], DO  ? 10 months ago Acute non-recurrent frontal sinusitis  ? [place], DO  ? 10 months ago Annual physical exam  ? [place], DO  ?  ?  ?Future Appointments   ?        ? In 2 months [facility], [place]   ?  ? ?  ?  ?  ? ? ?

## 2021-04-29 ENCOUNTER — Encounter: Payer: Self-pay | Admitting: Family Medicine

## 2021-05-05 ENCOUNTER — Encounter: Payer: Self-pay | Admitting: Family Medicine

## 2021-05-05 DIAGNOSIS — F5101 Primary insomnia: Secondary | ICD-10-CM

## 2021-05-06 MED ORDER — BELSOMRA 15 MG PO TABS: 15.0000 mg | ORAL_TABLET | Freq: Every evening | ORAL | 2 refills | Status: DC | PRN

## 2021-06-15 ENCOUNTER — Ambulatory Visit: Payer: Medicare Other

## 2021-07-09 ENCOUNTER — Ambulatory Visit: Payer: Medicare Other

## 2021-07-11 ENCOUNTER — Other Ambulatory Visit: Payer: Self-pay | Admitting: Family Medicine

## 2021-07-12 NOTE — Telephone Encounter (Signed)
Requested medication (s) are due for refill today:   Yes  Requested medication (s) are on the active medication list:   Yes  Future visit scheduled:   Yes in 1 wk   Last ordered: 04/15/2021 #90, 0 refills  Returned because PSA is due and a 1 yr supply is being requested   Requested Prescriptions  Pending Prescriptions Disp Refills   tamsulosin (FLOMAX) 0.4 MG CAPS capsule [Pharmacy Med Name: Tamsulosin HCl 0.4 MG Oral Capsule] 90 capsule 3    Sig: TAKE 1 CAPSULE BY MOUTH  DAILY     Urology: Alpha-Adrenergic Blocker Failed - 07/11/2021  1:10 PM      Failed - PSA in normal range and within 360 days    PSA  Date Value Ref Range Status  05/22/2020 3.00 < OR = 4.0 ng/mL Final    Comment:    The total PSA value from this assay system is  standardized against the WHO standard. The test  result will be approximately 20% lower when compared  to the equimolar-standardized total PSA (Beckman  Coulter). Comparison of serial PSA results should be  interpreted with this fact in mind. . This test was performed using the Siemens  chemiluminescent method. Values obtained from  different assay methods cannot be used interchangeably. PSA levels, regardless of value, should not be interpreted as absolute evidence of the presence or absence of disease.          Passed - Last BP in normal range    BP Readings from Last 1 Encounters:  03/26/21 123/67         Passed - Valid encounter within last 12 months    Recent Outpatient Visits           3 months ago Acute non-recurrent frontal sinusitis   [place], DO   5 months ago Primary insomnia   [place], DO   7 months ago Elevated TSH   [place], DO   1 year ago Acute non-recurrent frontal sinusitis   [facility] Olin Hauser, DO   1 year ago Annual physical exam   [facility] Olin Hauser, DO       Future Appointments             In 1 week [facility], Devonne Doughty, [facility], [place]   In 1 week  [facility], [place]

## 2021-07-21 NOTE — Patient Instructions (Signed)
Health Maintenance, Male Adopting a healthy lifestyle and getting preventive care are important in promoting health and wellness. Ask your health care provider about: The right schedule for you to have regular tests and exams. Things you can do on your own to prevent diseases and keep yourself healthy. What should I know about diet, weight, and exercise? Eat a healthy diet  Eat a diet that includes plenty of vegetables, fruits, low-fat dairy products, and lean protein. Do not eat a lot of foods that are high in solid fats, added sugars, or sodium. Maintain a healthy weight Body mass index (BMI) is a measurement that can be used to identify possible weight problems. It estimates body fat based on height and weight. Your health care provider can help determine your BMI and help you achieve or maintain a healthy weight. Get regular exercise Get regular exercise. This is one of the most important things you can do for your health. Most adults should: Exercise for at least 150 minutes each week. The exercise should increase your heart rate and make you sweat (moderate-intensity exercise). Do strengthening exercises at least twice a week. This is in addition to the moderate-intensity exercise. Spend less time sitting. Even light physical activity can be beneficial. Watch cholesterol and blood lipids Have your blood tested for lipids and cholesterol at 86 years of age, then have this test every 5 years. You may need to have your cholesterol levels checked more often if: Your lipid or cholesterol levels are high. You are older than 86 years of age. You are at high risk for heart disease. What should I know about cancer screening? Many types of cancers can be detected early and may often be prevented. Depending on your health history and family history, you may need to have cancer screening at various ages. This may include screening for: Colorectal cancer. Prostate cancer. Skin cancer. Lung  cancer. What should I know about heart disease, diabetes, and high blood pressure? Blood pressure and heart disease High blood pressure causes heart disease and increases the risk of stroke. This is more likely to develop in people who have high blood pressure readings or are overweight. Talk with your health care provider about your target blood pressure readings. Have your blood pressure checked: Every 3-5 years if you are 18-39 years of age. Every year if you are 40 years old or older. If you are between the ages of 65 and 75 and are a current or former smoker, ask your health care provider if you should have a one-time screening for abdominal aortic aneurysm (AAA). Diabetes Have regular diabetes screenings. This checks your fasting blood sugar level. Have the screening done: Once every three years after age 45 if you are at a normal weight and have a low risk for diabetes. More often and at a younger age if you are overweight or have a high risk for diabetes. What should I know about preventing infection? Hepatitis B If you have a higher risk for hepatitis B, you should be screened for this virus. Talk with your health care provider to find out if you are at risk for hepatitis B infection. Hepatitis C Blood testing is recommended for: Everyone born from 1945 through 1965. Anyone with known risk factors for hepatitis C. Sexually transmitted infections (STIs) You should be screened each year for STIs, including gonorrhea and chlamydia, if: You are sexually active and are younger than 86 years of age. You are older than 86 years of age and your   health care provider tells you that you are at risk for this type of infection. Your sexual activity has changed since you were last screened, and you are at increased risk for chlamydia or gonorrhea. Ask your health care provider if you are at risk. Ask your health care provider about whether you are at high risk for HIV. Your health care provider  may recommend a prescription medicine to help prevent HIV infection. If you choose to take medicine to prevent HIV, you should first get tested for HIV. You should then be tested every 3 months for as long as you are taking the medicine. Follow these instructions at home: Alcohol use Do not drink alcohol if your health care provider tells you not to drink. If you drink alcohol: Limit how much you have to 0-2 drinks a day. Know how much alcohol is in your drink. In the [place] one drink equals one 12 oz bottle of beer (355 mL), one 5 oz glass of wine (148 mL), or one 1 oz glass of hard liquor (44 mL). Lifestyle Do not use any products that contain nicotine or tobacco. These products include cigarettes, chewing tobacco, and vaping devices, such as e-cigarettes. If you need help quitting, ask your health care provider. Do not use street drugs. Do not share needles. Ask your health care provider for help if you need support or information about quitting drugs. General instructions Schedule regular health, dental, and eye exams. Stay current with your vaccines. Tell your health care provider if: You often feel depressed. You have ever been abused or do not feel safe at home. Summary Adopting a healthy lifestyle and getting preventive care are important in promoting health and wellness. Follow your health care provider's instructions about healthy diet, exercising, and getting tested or screened for diseases. Follow your health care provider's instructions on monitoring your cholesterol and blood pressure. This information is not intended to replace advice given to you by your health care provider. Make sure you discuss any questions you have with your health care provider. Document Revised: 06/15/2020 Document Reviewed: 06/15/2020 Elsevier Patient Education  2023 Elsevier Inc.  

## 2021-07-21 NOTE — Progress Notes (Signed)
Subjective:   ODEL SCHMID is a 88 y.o. male who presents for Medicare Annual/Subsequent preventive examination.  Review of Systems    Per HPI unless specifically indicated below         Objective:    Today's Vitals   07/22/21 0838 07/22/21 0850  BP: 127/65   Pulse: 71   Resp: 18   SpO2: 99%   Weight: 150 lb (68 kg)   Height: 6' (1.829 m)   PainSc: 0-No pain 0-No pain   Body mass index is 20.34 kg/m.     10/06/2020    8:48 AM 06/09/2020    3:18 PM 05/28/2019    2:03 PM 11/13/2017    5:50 PM 10/10/2017    8:18 AM  Advanced Directives  Does Patient Have a Medical Advance Directive? Yes Yes Yes Yes Yes  Type of Paramedic of [place];Living will [place];Living will Living will;Healthcare Power of Attorney Living will [place] in Chart?  No - copy requested No - copy requested  No - copy requested    Current Medications (verified) Outpatient Encounter Medications as of 07/22/2021  Medication Sig   acetaminophen (TYLENOL) 500 MG tablet Take 500 mg by mouth every 6 (six) hours as needed.   bisoprolol-hydrochlorothiazide (ZIAC) 5-6.25 MG tablet Take 1 tablet by mouth daily.   fluticasone (FLONASE) 50 MCG/ACT nasal spray Place into both nostrils daily.   ketoconazole (NIZORAL) 2 % cream Apply 1 application topically daily. qhs to feet, legs, buttocks and back   latanoprost (XALATAN) 0.005 % ophthalmic solution INT 1 GTT IN OU QHS   omeprazole (PRILOSEC) 20 MG capsule TAKE 1 CAPSULE BY MOUTH  DAILY   rosuvastatin (CRESTOR) 5 MG tablet Take 1 tablet (5 mg total) by mouth 2 (two) times a week.   Specialty Vitamins Products (PROSTATE PO) Take by mouth.   tamsulosin (FLOMAX) 0.4 MG CAPS capsule TAKE 1 CAPSULE BY MOUTH DAILY   BELSOMRA 20 MG TABS Take 10-20 mg by mouth at bedtime as needed. (Patient not taking: Reported on 07/22/2021)   [DISCONTINUED] amoxicillin-clavulanate  (AUGMENTIN) 875-125 MG tablet Take 1 tablet by mouth 2 (two) times daily.   [DISCONTINUED] BELSOMRA 15 MG TABS Take 15 mg by mouth at bedtime as needed.   [DISCONTINUED] bisoprolol-hydrochlorothiazide (ZIAC) 5-6.25 MG tablet TAKE 1 TABLET BY MOUTH  DAILY   [DISCONTINUED] rosuvastatin (CRESTOR) 5 MG tablet Take 1 tablet (5 mg total) by mouth 2 (two) times a week.   No facility-administered encounter medications on file as of 07/22/2021.    Allergies (verified) Codeine   History: Past Medical History:  Diagnosis Date   Anxiety    Glaucoma    Hypertension    Urinary incontinence    Past Surgical History:  Procedure Laterality Date   COLONOSCOPY WITH PROPOFOL N/A 10/10/2017   Procedure: COLONOSCOPY WITH PROPOFOL;  Surgeon: Lucilla Lame, MD;  Location: [facility] ENDOSCOPY;  Service: Endoscopy;  Laterality: N/A;   ESOPHAGOGASTRODUODENOSCOPY (EGD) WITH PROPOFOL N/A 10/10/2017   Procedure: ESOPHAGOGASTRODUODENOSCOPY (EGD) WITH PROPOFOL;  Surgeon: Lucilla Lame, MD;  Location: [facility] ENDOSCOPY;  Service: Endoscopy;  Laterality: N/A;   ESOPHAGOGASTRODUODENOSCOPY (EGD) WITH PROPOFOL N/A 10/06/2020   Procedure: ESOPHAGOGASTRODUODENOSCOPY (EGD) WITH PROPOFOL;  Surgeon: Lucilla Lame, MD;  Location: [facility] ENDOSCOPY;  Service: Endoscopy;  Laterality: N/A;   HERNIA REPAIR     twice   TONSILLECTOMY     Family History  Problem Relation Age of Onset  ALS Mother    Social History   Socioeconomic History   Marital status: Widowed    Spouse name: Not on file   Number of children: Not on file   Years of education: High School   Highest education level: High school graduate  Occupational History   Occupation: retired  Tobacco Use   Smoking status: Never   Smokeless tobacco: Never  Vaping Use   Vaping Use: Never used  Substance and Sexual Activity   Alcohol use: Never   Drug use: Never   Sexual activity: Not Currently    Birth control/protection: None  Other Topics Concern   Not on file  Social  History Narrative   Not on file   Social Determinants of Health   Financial Resource Strain: Low Risk  (07/22/2021)   Overall Financial Resource Strain (CARDIA)    Difficulty of Paying Living Expenses: Not hard at all  Food Insecurity: No Food Insecurity (07/22/2021)   Hunger Vital Sign    Worried About Running Out of Food in the Last Year: Never true    [place] in the Last Year: Never true  Transportation Needs: No Transportation Needs (07/22/2021)   PRAPARE - Hydrologist (Medical): No    Lack of Transportation (Non-Medical): No  Physical Activity: Inactive (06/09/2020)   Exercise Vital Sign    Days of Exercise per Week: 0 days    Minutes of Exercise per Session: 0 min  Stress: No Stress Concern Present (06/09/2020)   [place]    Feeling of Stress : Not at all  Social Connections: Socially Isolated (07/22/2021)   Social Connection and Isolation Panel [NHANES]    Frequency of Communication with Friends and Family: More than three times a week    Frequency of Social Gatherings with Friends and Family: More than three times a week    Attends Religious Services: Never    Marine scientist or Organizations: No    Attends Archivist Meetings: Never    Marital Status: Widowed    Tobacco Counseling Counseling given: Not Answered   Clinical Intake:  Pre-visit preparation completed: No  Pain : No/denies pain Pain Score: 0-No pain     Nutritional Status: BMI of 19-24  Normal Nutritional Risks: None Diabetes: No  How often do you need to have someone help you when you read instructions, pamphlets, or other written materials from your doctor or pharmacy?: 1 - Never  Diabetic?No    Interpreter Needed?: No  Information entered by :: Donnie Mesa, CMA   Activities of Daily Living    07/22/2021   10:02 AM 07/22/2021    8:49 AM  In your present state of  health, do you have any difficulty performing the following activities:  Hearing? 1 1  Vision? 1 1  Difficulty concentrating or making decisions? 0 0  Walking or climbing stairs? 1 1  Dressing or bathing? 0 0  Doing errands, shopping? 1 1    Patient Care Team: Olin Hauser, DO as PCP - General (Family Medicine)  Indicate any recent Medical Services you may have received from other than [facility] providers in the past year (date may be approximate).    No hospitalization in the past 12 months.  Assessment:   This is a routine wellness examination for [place].  Hearing/Vision screen No results found.  Dietary issues and exercise activities discussed: Current Exercise Habits: The patient has  a physically strenuous job, but has no regular exercise apart from work.;Home exercise routine, Time (Minutes): 30, Frequency (Times/Week): 7, Weekly Exercise (Minutes/Week): 210, Intensity: Mild, Exercise limited by: None identifiedNo hospitalization in the past 12 months.    Goals Addressed   None    Depression Screen    07/22/2021    8:43 AM 03/26/2021    8:38 AM 06/09/2020    3:19 PM 11/27/2019    3:50 PM 05/28/2019    1:58 PM 04/09/2019    4:09 PM 11/27/2018    2:14 PM  PHQ 2/9 Scores  PHQ - 2 Score 0 0 0 0 0 0 0  PHQ- 9 Score  0     0    Fall Risk    07/22/2021   10:01 AM 07/22/2021    8:46 AM 03/26/2021    8:38 AM 06/09/2020    3:18 PM 11/27/2019    3:50 PM  [place] in the past year? 0 0 0 0 0  Number falls in past yr:  0 0  0  Injury with Fall?  0 0  0  Risk for fall due to :  No Fall Risks History of fall(s);Impaired balance/gait Medication side effect   Follow up  Falls evaluation completed Falls evaluation completed Falls evaluation completed;Education provided;Falls prevention discussed Falls evaluation completed    FALL RISK PREVENTION PERTAINING TO THE HOME:  Any stairs in or around the home? Yes  If so, are there any without handrails? Yes  Home free  of loose throw rugs in walkways, pet beds, electrical cords, etc? Yes  Adequate lighting in your home to reduce risk of falls? Yes   ASSISTIVE DEVICES UTILIZED TO PREVENT FALLS:  Life alert? No  Use of a cane, walker or w/c? No  Grab bars in the bathroom? No  Shower chair or bench in shower? No  Elevated toilet seat or a handicapped toilet? No   TIMED UP AND GO:  Was the test performed? Yes .  Length of time to ambulate 10 feet: 10  sec.   Gait slow and steady without use of assistive device  Cognitive Function:        07/22/2021    8:47 AM 06/09/2020    3:21 PM 05/28/2019    2:03 PM  6CIT Screen  What Year? 0 points 0 points 0 points  What month? 0 points 0 points 0 points  What time? 0 points 0 points 0 points  Count back from 20 0 points 0 points 2 points  Months in reverse 0 points 0 points 0 points  Repeat phrase 2 points 8 points 0 points  Total Score 2 points 8 points 2 points    Immunizations  There is no immunization history on file for this patient.  TDAP status: Due, Education has been provided regarding the importance of this vaccine. Advised may receive this vaccine at local pharmacy or Health Dept. Aware to provide a copy of the vaccination record if obtained from local pharmacy or Health Dept. Verbalized acceptance and understanding.  Flu Vaccine status: Up to date  Pneumococcal vaccine status: Due, Education has been provided regarding the importance of this vaccine. Advised may receive this vaccine at local pharmacy or Health Dept. Aware to provide a copy of the vaccination record if obtained from local pharmacy or Health Dept. Verbalized acceptance and understanding.  Covid-19 vaccine status: Declined, Education has been provided regarding the importance of this vaccine but patient still declined. Advised  may receive this vaccine at local pharmacy or Health Dept.or vaccine clinic. Aware to provide a copy of the vaccination record if obtained from local  pharmacy or Health Dept. Verbalized acceptance and understanding.  Qualifies for Shingles Vaccine? Yes   Zostavax completed No   Shingrix Completed?: No.    Education has been provided regarding the importance of this vaccine. Patient has been advised to call insurance company to determine out of pocket expense if they have not yet received this vaccine. Advised may also receive vaccine at local pharmacy or Health Dept. Verbalized acceptance and understanding.  Screening Tests Health Maintenance  Topic Date Due   COVID-19 Vaccine (1) Never done   TETANUS/TDAP  Never done   Zoster Vaccines- Shingrix (1 of 2) Never done   Pneumonia Vaccine 60+ Years old (1 - PCV) 07/23/2022 (Originally 03/30/1994)   INFLUENZA VACCINE  09/07/2021   HPV VACCINES  Aged Out    Health Maintenance  Health Maintenance Due  Topic Date Due   COVID-19 Vaccine (1) Never done   TETANUS/TDAP  Never done   Zoster Vaccines- Shingrix (1 of 2) Never done    Colorectal cancer screening: No longer required.   Lung Cancer Screening: (Low Dose CT Chest recommended if Age 87-80 years, 30 pack-year currently smoking OR have quit w/in 15years.) does not qualify.   Lung Cancer Screening Referral: does not qualify   Additional Screening  Hepatitis C Screening: doesn't qualify  Vision Screening: Recommended annual ophthalmology exams for early detection of glaucoma and other disorders of the eye. Is the patient up to date with their annual eye exam?  Yes  Who is the provider or what is the name of the office in which the patient attends annual eye exams? Dr. Ellin Mayhew  If pt is not established with a provider, would they like to be referred to a provider to establish care? No .   Dental Screening: Recommended annual dental exams for proper oral hygiene  Community Resource Referral / Chronic Care Management: CRR required this visit?  No   CCM required this visit?  No      Plan:     I have personally reviewed and  noted the following in the patient's chart:   Medical and social history Use of alcohol, tobacco or illicit drugs  Current medications and supplements including opioid prescriptions. Patient is not currently taking opioid prescriptions. Functional ability and status Nutritional status Physical activity Advanced directives List of other physicians Hospitalizations, surgeries, and ER visits in previous 12 months Vitals Screenings to include cognitive, depression, and falls Referrals and appointments  In addition, I have reviewed and discussed with patient certain preventive protocols, quality metrics, and best practice recommendations. A written personalized care plan for preventive services as well as general preventive health recommendations were provided to patient.     Wilson Singer, [place]   07/22/2021

## 2021-07-22 ENCOUNTER — Encounter: Payer: Self-pay | Admitting: Family Medicine

## 2021-07-22 ENCOUNTER — Ambulatory Visit (INDEPENDENT_AMBULATORY_CARE_PROVIDER_SITE_OTHER): Payer: Medicare Other

## 2021-07-22 ENCOUNTER — Ambulatory Visit (INDEPENDENT_AMBULATORY_CARE_PROVIDER_SITE_OTHER): Payer: Medicare Other | Admitting: Family Medicine

## 2021-07-22 VITALS — BP 127/65 | HR 71 | Resp 18 | Ht 72.0 in | Wt 150.0 lb

## 2021-07-22 VITALS — BP 127/65 | HR 71 | Ht 72.0 in | Wt 150.4 lb

## 2021-07-22 DIAGNOSIS — N138 Other obstructive and reflux uropathy: Secondary | ICD-10-CM | POA: Diagnosis not present

## 2021-07-22 DIAGNOSIS — R7989 Other specified abnormal findings of blood chemistry: Secondary | ICD-10-CM

## 2021-07-22 DIAGNOSIS — E7800 Pure hypercholesterolemia, unspecified: Secondary | ICD-10-CM

## 2021-07-22 DIAGNOSIS — I129 Hypertensive chronic kidney disease with stage 1 through stage 4 chronic kidney disease, or unspecified chronic kidney disease: Secondary | ICD-10-CM | POA: Diagnosis not present

## 2021-07-22 DIAGNOSIS — Z0000 Encounter for general adult medical examination without abnormal findings: Secondary | ICD-10-CM

## 2021-07-22 DIAGNOSIS — N183 Chronic kidney disease, stage 3 unspecified: Secondary | ICD-10-CM

## 2021-07-22 DIAGNOSIS — F5101 Primary insomnia: Secondary | ICD-10-CM | POA: Diagnosis not present

## 2021-07-22 DIAGNOSIS — R7309 Other abnormal glucose: Secondary | ICD-10-CM

## 2021-07-22 DIAGNOSIS — I10 Essential (primary) hypertension: Secondary | ICD-10-CM

## 2021-07-22 DIAGNOSIS — N401 Enlarged prostate with lower urinary tract symptoms: Secondary | ICD-10-CM

## 2021-07-22 MED ORDER — BISOPROLOL-HYDROCHLOROTHIAZIDE 5-6.25 MG PO TABS: 1.0000 | ORAL_TABLET | Freq: Every day | ORAL | 3 refills | Status: DC

## 2021-07-22 MED ORDER — BELSOMRA 20 MG PO TABS: 10.0000 mg | ORAL_TABLET | Freq: Every evening | ORAL | 2 refills | Status: DC | PRN

## 2021-07-22 MED ORDER — ROSUVASTATIN CALCIUM 5 MG PO TABS: 5.0000 mg | ORAL_TABLET | ORAL | 3 refills | Status: DC

## 2021-07-22 NOTE — Assessment & Plan Note (Signed)
Improved control on med management Chronic problem Some sleep cycle issues with sleeping in late morning worsen sleep  Adjusted Insomnia med Belsomra now '20mg'$  tablets, take HALF for '10mg'$  dose most nights as needed, if need full tab can take with caution.

## 2021-07-22 NOTE — Patient Instructions (Addendum)
Thank you for coming to the office today.  Refilled BP med and Cholesterol  Adjusted Insomnia med Belsomra now '20mg'$  tablets, take HALF most nights as needed, if need full tab can take with caution.  Try double voiding for improved urinary function.  Labs today, stay tuned on results. Non fasting FYI  Reconsider Pneumonia vaccine!   Please schedule a Follow-up Appointment to: Return in about 6 months (around 01/21/2022) for 6 month follow-up HTN Insomnia updates.  If you have any other questions or concerns, please feel free to call the office or send a message through [place]. You may also schedule an earlier appointment if necessary.  Additionally, you may be receiving a survey about your experience at our office within a few days to 1 week by e-mail or mail. We value your feedback.  [name], DO [place]

## 2021-07-22 NOTE — Progress Notes (Signed)
Subjective:    Patient ID: Scott Ashley, male    DOB: August 01, 1929, 88 y.o.   MRN: [id number]  Scott Ashley is a 88 y.o. male presenting on 07/22/2021 for Annual Exam   HPI  Here for Annual Physical and Fasting Labs. Also has visit with Donnie Mesa CMA for AMW.  Here with granddaughter [name]  Insomnia Reports chronic problem, with some difficulty falling asleep at times. He can occasionally sleep in late and then has difficulty falling asleep at night sometimes. - Previous medication course with Mirtazapine 7.5 to 15, Trazodone, Ambien  Now improved on Belsomra, 10-15 mg dose in past, asking about adjusting to 10-43m. Not endorsing grogginess or sedation next day.   Weight Loss / Reduced Appetite Reduced sweets, overall good appetite, but not eating as much usually. Often reduced portions. He does eat peanut butter and higher calorie/protein foods when able. Weight down 4 lbs in past 4 months.  CHRONIC HTN with CKD-III / History of Left Renal Mass Labs, creatinine stable. Reports no new concerns. Checking BP regularly. He is drinking plenty of water. He does not take NSAIDs. Takes Tylenol PRN. [facility] L Renal Mass upcoming visit. Current Meds - Bisoprolol-HCTZ 5-6.25mg   Reports good compliance, took meds today. Tolerating well, w/o complaints. Denies CP, dyspnea, HA, edema, dizziness / lightheadedness   History of H Pylori Gastric / Abnormal bowel habits - He was on dual antibiotic therapy, sensitive stomach from antibiotics. Has improved - S/p Colonoscopy 10/2017 AGI   HYPERLIPIDEMIA: Had possible myalgia reaction on STATIN. Had stopped then restarted again. Taking Rosuvastatin 587mTWICE a week only Lipid improved still controlled on intermittent statin. No more myalgia or side effects Off ASA   Constipation GERD improved. On PPI 2057maily Occaisonal abdominal pain - usually after eats Taking Stool softener, daily,  but not always effective Has miralax, not taking.   BPH LUTS / Nocturia Followed by Urology [facility] Dr SniDiamantina Providenceext apt 06/10/20 He is taking OTC Prostagenix and Tamsulosin 0.4mg75mily - good results overall. Occasional nocturia still maybe 1-3 x, overall improved.   Health Maintenance: Declines vaccines.     07/22/2021    8:43 AM 03/26/2021    8:38 AM 06/09/2020    3:19 PM  Depression screen PHQ 2/9  Decreased Interest 0 0 0  Down, Depressed, Hopeless 0 0 0  PHQ - 2 Score 0 0 0  Altered sleeping  0   Tired, decreased energy  0   Change in appetite  0   Feeling bad or failure about yourself   0   Trouble concentrating  0   Moving slowly or fidgety/restless  0   Suicidal thoughts  0   PHQ-9 Score  0   Difficult doing work/chores  Not difficult at all     Past Medical History:  Diagnosis Date   Anxiety    Glaucoma    Hypertension    Urinary incontinence    Past Surgical History:  Procedure Laterality Date   COLONOSCOPY WITH PROPOFOL N/A 10/10/2017   Procedure: COLONOSCOPY WITH PROPOFOL;  Surgeon: WohlLucilla Lame;  Location: [facility] ENDOSCOPY;  Service: Endoscopy;  Laterality: N/A;   ESOPHAGOGASTRODUODENOSCOPY (EGD) WITH PROPOFOL N/A 10/10/2017   Procedure: ESOPHAGOGASTRODUODENOSCOPY (EGD) WITH PROPOFOL;  Surgeon: WohlLucilla Lame;  Location: [facility] ENDOSCOPY;  Service: Endoscopy;  Laterality: N/A;   ESOPHAGOGASTRODUODENOSCOPY (EGD) WITH PROPOFOL N/A 10/06/2020   Procedure: ESOPHAGOGASTRODUODENOSCOPY (EGD) WITH PROPOFOL;  Surgeon: WohlAllen Norris  Darren, MD;  Location: [facility];  Service: Endoscopy;  Laterality: N/A;   HERNIA REPAIR     twice   TONSILLECTOMY     Social History   Socioeconomic History   Marital status: Widowed    Spouse name: Not on file   Number of children: Not on file   Years of education: High School   Highest education level: High school graduate  Occupational History   Occupation: retired  Tobacco Use   Smoking status: Never   Smokeless tobacco: Never   Vaping Use   Vaping Use: Never used  Substance and Sexual Activity   Alcohol use: Never   Drug use: Never   Sexual activity: Not Currently    Birth control/protection: None  Other Topics Concern   Not on file  Social History Narrative   Not on file   Social Determinants of Health   Financial Resource Strain: Low Risk  (07/22/2021)   Overall Financial Resource Strain (CARDIA)    Difficulty of Paying Living Expenses: Not hard at all  Food Insecurity: No Food Insecurity (07/22/2021)   Hunger Vital Sign    Worried About Running Out of Food in the Last Year: Never true    [place] in the Last Year: Never true  Transportation Needs: No Transportation Needs (07/22/2021)   PRAPARE - Hydrologist (Medical): No    Lack of Transportation (Non-Medical): No  Physical Activity: Inactive (06/09/2020)   Exercise Vital Sign    Days of Exercise per Week: 0 days    Minutes of Exercise per Session: 0 min  Stress: No Stress Concern Present (06/09/2020)   [place]    Feeling of Stress : Not at all  Social Connections: Socially Isolated (07/22/2021)   Social Connection and Isolation Panel [NHANES]    Frequency of Communication with Friends and Family: More than three times a week    Frequency of Social Gatherings with Friends and Family: More than three times a week    Attends Religious Services: Never    Marine scientist or Organizations: No    Attends Archivist Meetings: Never    Marital Status: Widowed  Intimate Partner Violence: Not At Risk (07/22/2021)   Humiliation, Afraid, Rape, and Kick questionnaire    Fear of Current or Ex-Partner: No    Emotionally Abused: No    Physically Abused: No    Sexually Abused: No   Family History  Problem Relation Age of Onset   ALS Mother    Current Outpatient Medications on File Prior to Visit  Medication Sig   acetaminophen (TYLENOL)  500 MG tablet Take 500 mg by mouth every 6 (six) hours as needed.   fluticasone (FLONASE) 50 MCG/ACT nasal spray Place into both nostrils daily.   latanoprost (XALATAN) 0.005 % ophthalmic solution INT 1 GTT IN OU QHS   omeprazole (PRILOSEC) 20 MG capsule TAKE 1 CAPSULE BY MOUTH  DAILY   Specialty Vitamins Products (PROSTATE PO) Take by mouth.   tamsulosin (FLOMAX) 0.4 MG CAPS capsule TAKE 1 CAPSULE BY MOUTH DAILY   ketoconazole (NIZORAL) 2 % cream Apply 1 application topically daily. qhs to feet, legs, buttocks and back   No current facility-administered medications on file prior to visit.    Review of Systems  Constitutional:  Negative for activity change, appetite change, chills, diaphoresis, fatigue and fever.  HENT:  Positive for hearing loss. Negative for congestion.  Eyes:  Negative for visual disturbance.  Respiratory:  Negative for cough, chest tightness, shortness of breath and wheezing.   Cardiovascular:  Negative for chest pain, palpitations and leg swelling.  Gastrointestinal:  Negative for abdominal pain, constipation, diarrhea, nausea and vomiting.  Genitourinary:  Negative for dysuria, frequency and hematuria.  Musculoskeletal:  Negative for arthralgias and neck pain.  Skin:  Negative for rash.  Neurological:  Negative for dizziness, weakness, light-headedness, numbness and headaches.  Hematological:  Negative for adenopathy.  Psychiatric/Behavioral:  Negative for behavioral problems, dysphoric mood and sleep disturbance.    Per HPI unless specifically indicated above      Objective:    BP 127/65   Pulse 71   Ht 6' (1.829 m)   Wt 150 lb 6.4 oz (68.2 kg)   SpO2 99%   BMI 20.40 kg/m   Wt Readings from Last 3 Encounters:  07/22/21 150 lb (68 kg)  07/22/21 150 lb 6.4 oz (68.2 kg)  03/26/21 154 lb 6.4 oz (70 kg)    Physical Exam Vitals and nursing note reviewed.  Constitutional:      General: He is not in acute distress.    Appearance: He is well-developed.  He is not diaphoretic.     Comments: Well-appearing, comfortable, cooperative  HENT:     Head: Normocephalic and atraumatic.     Comments: Hearing loss has hearing aid Eyes:     General:        Right eye: No discharge.        Left eye: No discharge.     Conjunctiva/sclera: Conjunctivae normal.     Pupils: Pupils are equal, round, and reactive to light.  Neck:     Thyroid: No thyromegaly.  Cardiovascular:     Rate and Rhythm: Normal rate and regular rhythm.     Pulses: Normal pulses.     Heart sounds: Normal heart sounds. No murmur heard. Pulmonary:     Effort: Pulmonary effort is normal. No respiratory distress.     Breath sounds: Normal breath sounds. No wheezing or rales.  Abdominal:     General: Bowel sounds are normal. There is no distension.     Palpations: Abdomen is soft. There is no mass.     Tenderness: There is no abdominal tenderness.  Musculoskeletal:        General: No tenderness. Normal range of motion.     Cervical back: Normal range of motion and neck supple.     Comments: Upper / Lower Extremities: - Normal muscle tone, strength bilateral upper extremities 5/5, lower extremities 5/5  Lymphadenopathy:     Cervical: No cervical adenopathy.  Skin:    General: Skin is warm and dry.     Findings: No erythema or rash.  Neurological:     Mental Status: He is alert and oriented to person, place, and time.     Comments: Distal sensation intact to light touch all extremities  Psychiatric:        Mood and Affect: Mood normal.        Behavior: Behavior normal.        Thought Content: Thought content normal.     Comments: Well groomed, good eye contact, normal speech and thoughts      Results for orders placed or performed in visit on 11/19/20  T4, free  Result Value Ref Range   Free T4 1.2 0.8 - 1.8 ng/dL  TSH  Result Value Ref Range   TSH 8.89 (H) 0.40 - 4.50 mIU/L  BASIC METABOLIC PANEL WITH GFR  Result Value Ref Range   Glucose, Bld 101 (H) 65 - 99 mg/dL    BUN 18 7 - 25 mg/dL   Creat 1.18 0.70 - 1.22 mg/dL   eGFR 59 (L) > OR = 60 mL/min/1.16m   BUN/Creatinine Ratio NOT APPLICABLE 6 - 22 (calc)   Sodium 143 135 - 146 mmol/L   Potassium 4.3 3.5 - 5.3 mmol/L   Chloride 103 98 - 110 mmol/L   CO2 31 20 - 32 mmol/L   Calcium 10.3 8.6 - 10.3 mg/dL      Assessment & Plan:   Problem List Items Addressed This Visit     Pure hypercholesterolemia    Controlled cholesterol on lifestyle and intermittent statin now Last lipid panel 05/2020, LDL controlled Myalgia on regular dosing statin Rosvau 5-111mOFF ASA 81 due to bruising bleeding  Non fasting lab today Lipid  Plan: 1. Continue intermittent STATIN - Rosuvastatin 35m70mwice a week Encourage improved lifestyle - low carb/cholesterol, reduce portion size, continue improving regular exercise      Relevant Medications   rosuvastatin (CRESTOR) 5 MG tablet   bisoprolol-hydrochlorothiazide (ZIAC) 5-6.25 MG tablet   Insomnia    Improved control on med management Chronic problem Some sleep cycle issues with sleeping in late morning worsen sleep  Adjusted Insomnia med Belsomra now 22m61mblets, take HALF for 10mg30me most nights as needed, if need full tab can take with caution.      Relevant Medications   BELSOMRA 20 MG TABS   Elevated TSH   BPH with obstruction/lower urinary tract symptoms    Controlled Followed by [facility] Dr SninsDiamantina Providencelomax 0.4mg d70my and prostate supplement OTC  PSA 3.0, appropriate for age and BPH. F/u urology  Trial double voiding / bladder strategy      Benign hypertension with CKD (chronic kidney disease) stage III (HCC)  [place]-controlled HTN Complication with CKD-III, mild improved Cr on lab previously  Non fasting lab today  Plan:  1. Continue current BP regimen - Bisoprolol-HCTZ 5-6.235mg 2. Encourage improved lifestyle - low sodium diet, regular exercise 3. Continue monitor BP outside office, bring readings to next visit, if persistently  >140/90 or new symptoms notify office sooner      Relevant Medications   rosuvastatin (CRESTOR) 5 MG tablet   bisoprolol-hydrochlorothiazide (ZIAC) 5-6.25 MG tablet   Other Visit Diagnoses     Annual physical exam    -  Primary   Essential hypertension       Relevant Medications   rosuvastatin (CRESTOR) 5 MG tablet   bisoprolol-hydrochlorothiazide (ZIAC) 5-6.25 MG tablet   Abnormal glucose           Updated Health Maintenance information Reviewed recent lab results with patient Encouraged improvement to lifestyle with diet and exercise Goal maintain weight  Refilled BP med and Cholesterol  Labs today, stay tuned on results. Non fasting FYI  Reconsider Pneumonia vaccine!  Meds ordered this encounter  Medications   BELSOMRA 20 MG TABS    Sig: Take 10-20 mg by mouth at bedtime as needed.    Dispense:  30 tablet    Refill:  2   rosuvastatin (CRESTOR) 5 MG tablet    Sig: Take 1 tablet (5 mg total) by mouth 2 (two) times a week.    Dispense:  45 tablet    Refill:  3   bisoprolol-hydrochlorothiazide (ZIAC) 5-6.25 MG tablet    Sig: Take 1 tablet by mouth daily.  Dispense:  90 tablet    Refill:  3    Requesting 1 year supply      Follow up plan: Return in about 6 months (around 01/21/2022) for 6 month follow-up HTN Insomnia updates.  [name], [facility] 07/22/2021, 8:23 AM

## 2021-07-22 NOTE — Assessment & Plan Note (Signed)
Well-controlled HTN Complication with CKD-III, mild improved Cr on lab previously  Non fasting lab today  Plan:  1. Continue current BP regimen - Bisoprolol-HCTZ 5-6.'25mg'$  2. Encourage improved lifestyle - low sodium diet, regular exercise 3. Continue monitor BP outside office, bring readings to next visit, if persistently >140/90 or new symptoms notify office sooner

## 2021-07-22 NOTE — Assessment & Plan Note (Signed)
Controlled cholesterol on lifestyle and intermittent statin now Last lipid panel 05/2020, LDL controlled Myalgia on regular dosing statin Rosvau 5-'10mg'$  OFF ASA 81 due to bruising bleeding  Non fasting lab today Lipid  Plan: 1. Continue intermittent STATIN - Rosuvastatin '5mg'$  twice a week Encourage improved lifestyle - low carb/cholesterol, reduce portion size, continue improving regular exercise

## 2021-07-22 NOTE — Assessment & Plan Note (Addendum)
Controlled Followed by [facility] Dr Diamantina Providence On Flomax 0.'4mg'$  daily and prostate supplement OTC  PSA 3.0, appropriate for age and BPH. F/u urology  Trial double voiding / bladder strategy

## 2021-07-23 LAB — CBC WITH DIFFERENTIAL/PLATELET
Absolute Monocytes: 616 cells/uL (ref 200–950)
Basophils Absolute: 67 cells/uL (ref 0–200)
Basophils Relative: 1.1 %
Eosinophils Absolute: 317 cells/uL (ref 15–500)
Eosinophils Relative: 5.2 %
HCT: 42.6 % (ref 38.5–50.0)
Hemoglobin: 13.8 g/dL (ref 13.2–17.1)
Lymphs Abs: 2092 cells/uL (ref 850–3900)
MCH: 27.9 pg (ref 27.0–33.0)
MCHC: 32.4 g/dL (ref 32.0–36.0)
MCV: 86.2 fL (ref 80.0–100.0)
MPV: 12.1 fL (ref 7.5–12.5)
Monocytes Relative: 10.1 %
Neutro Abs: 3007 cells/uL (ref 1500–7800)
Neutrophils Relative %: 49.3 %
Platelets: 139 10*3/uL — ABNORMAL LOW (ref 140–400)
RBC: 4.94 10*6/uL (ref 4.20–5.80)
RDW: 12.5 % (ref 11.0–15.0)
Total Lymphocyte: 34.3 %
WBC: 6.1 10*3/uL (ref 3.8–10.8)

## 2021-07-23 LAB — COMPLETE METABOLIC PANEL WITH GFR
AG Ratio: 1.3 (calc) (ref 1.0–2.5)
ALT: 9 U/L (ref 9–46)
AST: 13 U/L (ref 10–35)
Albumin: 4 g/dL (ref 3.6–5.1)
Alkaline phosphatase (APISO): 61 U/L (ref 35–144)
BUN: 17 mg/dL (ref 7–25)
CO2: 29 mmol/L (ref 20–32)
Calcium: 9.8 mg/dL (ref 8.6–10.3)
Chloride: 101 mmol/L (ref 98–110)
Creat: 1.09 mg/dL (ref 0.70–1.22)
Globulin: 3 g/dL (calc) (ref 1.9–3.7)
Glucose, Bld: 93 mg/dL (ref 65–139)
Potassium: 3.7 mmol/L (ref 3.5–5.3)
Sodium: 140 mmol/L (ref 135–146)
Total Bilirubin: 0.6 mg/dL (ref 0.2–1.2)
Total Protein: 7 g/dL (ref 6.1–8.1)
eGFR: 64 mL/min/{1.73_m2} (ref >=60)

## 2021-07-23 LAB — HEMOGLOBIN A1C
Hgb A1c MFr Bld: 5.6 % of total Hgb (ref <5.7)
Mean Plasma Glucose: 114 mg/dL
eAG (mmol/L): 6.3 mmol/L

## 2021-07-23 LAB — TSH: TSH: 0.2 mIU/L — ABNORMAL LOW (ref 0.40–4.50)

## 2021-07-23 LAB — LIPID PANEL
Cholesterol: 149 mg/dL (ref <200)
HDL: 50 mg/dL (ref >=40)
LDL Cholesterol (Calc): 82 mg/dL (calc)
Non-HDL Cholesterol (Calc): 99 mg/dL (calc) (ref <130)
Total CHOL/HDL Ratio: 3 (calc) (ref <5.0)
Triglycerides: 83 mg/dL (ref <150)

## 2021-07-23 LAB — PSA: PSA: 2.85 ng/mL (ref <=4.00)

## 2021-07-23 LAB — T4, FREE: Free T4: 1.5 ng/dL (ref 0.8–1.8)

## 2021-07-24 ENCOUNTER — Other Ambulatory Visit: Payer: Self-pay | Admitting: Internal Medicine

## 2021-07-24 DIAGNOSIS — E7800 Pure hypercholesterolemia, unspecified: Secondary | ICD-10-CM

## 2021-07-26 NOTE — Telephone Encounter (Signed)
Refilled 07/22/2021 #[id number] refills - receipt conformed by pharmacy. Requested Prescriptions  Pending Prescriptions Disp Refills  . rosuvastatin (CRESTOR) 5 MG tablet [Pharmacy Med Name: Rosuvastatin Calcium 5 MG Oral Tablet] 29 tablet 2    Sig: TAKE 1 TABLET BY MOUTH  TWICE WEEKLY     Cardiovascular:  Antilipid - Statins 2 Failed - 07/24/2021  1:04 PM      Failed - Lipid Panel in normal range within the last 12 months    Cholesterol  Date Value Ref Range Status  07/22/2021 149 <200 mg/dL Final   LDL Cholesterol (Calc)  Date Value Ref Range Status  07/22/2021 82 mg/dL (calc) Final    Comment:    Reference range: <100 . Desirable range <100 mg/dL for primary prevention;   <70 mg/dL for patients with CHD or diabetic patients  with > or = 2 CHD risk factors. Marland Kitchen LDL-C is now calculated using the Martin-Hopkins  calculation, which is a validated novel method providing  better accuracy than the Friedewald equation in the  estimation of LDL-C.  Cresenciano Genre et al. Annamaria Helling. 1448;185(63): 2061-2068  (http://education.[employer].com/faq/FAQ164)    HDL  Date Value Ref Range Status  07/22/2021 50 > OR = 40 mg/dL Final   Triglycerides  Date Value Ref Range Status  07/22/2021 83 <150 mg/dL Final         Passed - Cr in normal range and within 360 days    Creat  Date Value Ref Range Status  07/22/2021 1.09 0.70 - 1.22 mg/dL Final         Passed - Patient is not pregnant      Passed - Valid encounter within last 12 months    Recent Outpatient Visits          4 days ago Annual physical exam   [facility] Olin Hauser, DO   4 months ago Acute non-recurrent frontal sinusitis   [facility] [place], Devonne Doughty, DO   6 months ago Primary insomnia   [place], DO   8 months ago Elevated TSH   [place], DO   1 year ago Acute non-recurrent frontal  sinusitis   [facility] [place], Devonne Doughty, [place]

## 2021-07-27 DIAGNOSIS — H40013 Open angle with borderline findings, low risk, bilateral: Secondary | ICD-10-CM | POA: Diagnosis not present

## 2021-07-27 DIAGNOSIS — H353131 Nonexudative age-related macular degeneration, bilateral, early dry stage: Secondary | ICD-10-CM | POA: Diagnosis not present

## 2021-07-27 DIAGNOSIS — H2513 Age-related nuclear cataract, bilateral: Secondary | ICD-10-CM | POA: Diagnosis not present

## 2021-10-24 ENCOUNTER — Other Ambulatory Visit: Payer: Self-pay | Admitting: Family Medicine

## 2021-10-25 NOTE — Telephone Encounter (Signed)
Requested Prescriptions  Pending Prescriptions Disp Refills  . omeprazole (PRILOSEC) 20 MG capsule [Pharmacy Med Name: Omeprazole 20 MG Oral Capsule Delayed Release] 100 capsule 2    Sig: TAKE 1 CAPSULE BY MOUTH DAILY     Gastroenterology: Proton Pump Inhibitors Passed - 10/24/2021 11:11 PM      Passed - Valid encounter within last 12 months    Recent Outpatient Visits          3 months ago Annual physical exam   [facility] Olin Hauser, DO   7 months ago Acute non-recurrent frontal sinusitis   [facility] Olin Hauser, DO   9 months ago Primary insomnia   [place], DO   11 months ago Elevated TSH   [place], DO   1 year ago Acute non-recurrent frontal sinusitis   [place], Devonne Doughty, [place]

## 2021-12-02 ENCOUNTER — Other Ambulatory Visit: Payer: Self-pay | Admitting: Family Medicine

## 2021-12-02 DIAGNOSIS — F5101 Primary insomnia: Secondary | ICD-10-CM

## 2021-12-03 NOTE — Telephone Encounter (Signed)
Requested medication (s) are due for refill today:yes  Requested medication (s) are on the active medication list: yes  Last refill:  07/22/21  Future visit scheduled: yes  Notes to clinic:  Medication not assigned to a protocol, review manually.     Requested Prescriptions  Pending Prescriptions Disp Refills   BELSOMRA 20 MG TABS [Pharmacy Med Name: BELSOMRA '20MG'$  TABLETS] 30 tablet     Sig: TAKE 1/2 TO 1 TABLET BY MOUTH AT BEDTIME AS NEEDED     Off-Protocol Failed - 12/02/2021  3:22 PM      Failed - Medication not assigned to a protocol, review manually.      Passed - Valid encounter within last 12 months    Recent Outpatient Visits           4 months ago Annual physical exam   [place], DO   8 months ago Acute non-recurrent frontal sinusitis   [facility] [place], Devonne Doughty, DO   10 months ago Primary insomnia   [facility] Olin Hauser, DO   1 year ago Elevated TSH   [place], DO   1 year ago Acute non-recurrent frontal sinusitis   [facility] [place], Devonne Doughty, [place]

## 2022-01-06 ENCOUNTER — Ambulatory Visit
Admission: RE | Admit: 2022-01-06 | Discharge: 2022-01-06 | Disposition: A | Discharge status: Home / Self Care | Payer: Medicare Other | Attending: Family Medicine | Admitting: Family Medicine

## 2022-01-06 ENCOUNTER — Encounter: Payer: Self-pay | Admitting: Family Medicine

## 2022-01-06 ENCOUNTER — Ambulatory Visit
Admission: RE | Admit: 2022-01-06 | Discharge: 2022-01-06 | Disposition: A | Discharge status: Home / Self Care | Payer: Medicare Other | Source: Ambulatory Visit | Attending: Family Medicine | Admitting: Family Medicine

## 2022-01-06 ENCOUNTER — Ambulatory Visit (INDEPENDENT_AMBULATORY_CARE_PROVIDER_SITE_OTHER): Payer: Medicare Other | Admitting: Family Medicine

## 2022-01-06 VITALS — BP 89/58 | HR 80 | Ht 72.0 in | Wt 150.0 lb

## 2022-01-06 DIAGNOSIS — R32 Unspecified urinary incontinence: Secondary | ICD-10-CM

## 2022-01-06 DIAGNOSIS — R109 Unspecified abdominal pain: Secondary | ICD-10-CM | POA: Insufficient documentation

## 2022-01-06 DIAGNOSIS — R8290 Unspecified abnormal findings in urine: Secondary | ICD-10-CM | POA: Diagnosis not present

## 2022-01-06 DIAGNOSIS — R350 Frequency of micturition: Secondary | ICD-10-CM

## 2022-01-06 MED ORDER — CEPHALEXIN 500 MG PO CAPS: 500.0000 mg | ORAL_CAPSULE | Freq: Three times a day (TID) | ORAL | 0 refills | Status: DC

## 2022-01-06 NOTE — Progress Notes (Signed)
Subjective:    Patient ID: Scott Ashley, male    DOB: 05/21/1929, 88 y.o.   MRN: [id number]  Scott Ashley is a 88 y.o. male presenting on 01/06/2022 for Abdominal Pain, Constipation, Back Pain, and Urinary Incontinence  Patient presents for a same day appointment.  Here with granddaughter Arby Barrette who provides most of history  HPI  Urinary Frequency Incontinence Flank Pain / Urinary Odor  Reports 1 week ago constipated, took laxatives and he was able to have bowel movement, then by Sunday had urinary incontinence, wears pull ups diaper, dark brown urine, still difficulty with ability to control urine has incontinence. Still making urine, better hydrated but urine has strong odor to it. Urine color still darker yellow - Admits some low back pain that has prevented him from getting out of bed much - Tried Tylenol and Cranberry juice - History of BPH - Wasn't reporting any dysuria prior to this episode - He still has issue with urinary incontinence - Admits some loose stool watery. He had incontinence bowel accident overnight - Last night able to get up and get showered - Denies fever nausea vomiting      07/22/2021    8:43 AM 03/26/2021    8:38 AM 06/09/2020    3:19 PM  Depression screen PHQ 2/9  Decreased Interest 0 0 0  Down, Depressed, Hopeless 0 0 0  PHQ - 2 Score 0 0 0  Altered sleeping  0   Tired, decreased energy  0   Change in appetite  0   Feeling bad or failure about yourself   0   Trouble concentrating  0   Moving slowly or fidgety/restless  0   Suicidal thoughts  0   PHQ-9 Score  0   Difficult doing work/chores  Not difficult at all     Social History   Tobacco Use   Smoking status: Never   Smokeless tobacco: Never  Vaping Use   Vaping Use: Never used  Substance Use Topics   Alcohol use: Never   Drug use: Never    Review of Systems Per HPI unless specifically indicated above     Objective:    BP (!) 89/58   Pulse 80   Ht 6' (1.829 m)   Wt  150 lb (68 kg)   SpO2 97%   BMI 20.34 kg/m   Wt Readings from Last 3 Encounters:  01/06/22 150 lb (68 kg)  07/22/21 150 lb (68 kg)  07/22/21 150 lb 6.4 oz (68.2 kg)    Physical Exam Vitals and nursing note reviewed.  Constitutional:      General: He is not in acute distress.    Appearance: He is well-developed. He is not diaphoretic.     Comments: Tired appearing 88 yr old male comfortable, cooperative  HENT:     Head: Normocephalic and atraumatic.  Eyes:     General:        Right eye: No discharge.        Left eye: No discharge.     Conjunctiva/sclera: Conjunctivae normal.  Neck:     Thyroid: No thyromegaly.  Cardiovascular:     Rate and Rhythm: Normal rate and regular rhythm.     Pulses: Normal pulses.     Heart sounds: Normal heart sounds. No murmur heard. Pulmonary:     Effort: Pulmonary effort is normal. No respiratory distress.     Breath sounds: Normal breath sounds. No wheezing or rales.  Abdominal:  General: There is no distension.     Palpations: Abdomen is soft. There is no mass.     Tenderness: There is no abdominal tenderness. There is no guarding.     Hernia: No hernia is present.     Comments: Hyperactive bowel sounds  Musculoskeletal:        General: Normal range of motion.     Cervical back: Normal range of motion and neck supple.     Right lower leg: No edema.     Left lower leg: No edema.  Lymphadenopathy:     Cervical: No cervical adenopathy.  Skin:    General: Skin is warm and dry.     Findings: No erythema or rash.  Neurological:     Mental Status: He is alert and oriented to person, place, and time. Mental status is at baseline.  Psychiatric:        Behavior: Behavior normal.     Comments: Well groomed, good eye contact, normal speech and thoughts, limited participation in exam and history but responds appropriately    Results for orders placed or performed in visit on 07/22/21  T4, free  Result Value Ref Range   Free T4 1.5 0.8 - 1.8  ng/dL  TSH  Result Value Ref Range   TSH 0.20 (L) 0.40 - 4.50 mIU/L  PSA  Result Value Ref Range   PSA 2.85 < OR = 4.00 ng/mL  Hemoglobin A1c  Result Value Ref Range   Hgb A1c MFr Bld 5.6 <5.7 % of total Hgb   Mean Plasma Glucose 114 mg/dL   eAG (mmol/L) 6.3 mmol/L  CBC with Differential/Platelet  Result Value Ref Range   WBC 6.1 3.8 - 10.8 Thousand/uL   RBC 4.94 4.20 - 5.80 Million/uL   Hemoglobin 13.8 13.2 - 17.1 g/dL   HCT 42.6 38.5 - 50.0 %   MCV 86.2 80.0 - 100.0 fL   MCH 27.9 27.0 - 33.0 pg   MCHC 32.4 32.0 - 36.0 g/dL   RDW 12.5 11.0 - 15.0 %   Platelets 139 (L) 140 - 400 Thousand/uL   MPV 12.1 7.5 - 12.5 fL   Neutro Abs 3,007 1,500 - 7,800 cells/uL   Lymphs Abs 2,092 850 - 3,900 cells/uL   Absolute Monocytes 616 200 - 950 cells/uL   Eosinophils Absolute 317 15 - 500 cells/uL   Basophils Absolute 67 0 - 200 cells/uL   Neutrophils Relative % 49.3 %   Total Lymphocyte 34.3 %   Monocytes Relative 10.1 %   Eosinophils Relative 5.2 %   Basophils Relative 1.1 %  Lipid panel  Result Value Ref Range   Cholesterol 149 <200 mg/dL   HDL 50 > OR = 40 mg/dL   Triglycerides 83 <150 mg/dL   LDL Cholesterol (Calc) 82 mg/dL (calc)   Total CHOL/HDL Ratio 3.0 <5.0 (calc)   Non-HDL Cholesterol (Calc) 99 <130 mg/dL (calc)  COMPLETE METABOLIC PANEL WITH GFR  Result Value Ref Range   Glucose, Bld 93 65 - 139 mg/dL   BUN 17 7 - 25 mg/dL   Creat 1.09 0.70 - 1.22 mg/dL   eGFR 64 > OR = 60 mL/min/1.76m   BUN/Creatinine Ratio NOT APPLICABLE 6 - 22 (calc)   Sodium 140 135 - 146 mmol/L   Potassium 3.7 3.5 - 5.3 mmol/L   Chloride 101 98 - 110 mmol/L   CO2 29 20 - 32 mmol/L   Calcium 9.8 8.6 - 10.3 mg/dL   Total Protein 7.0 6.1 - 8.1  g/dL   Albumin 4.0 3.6 - 5.1 g/dL   Globulin 3.0 1.9 - 3.7 g/dL (calc)   AG Ratio 1.3 1.0 - 2.5 (calc)   Total Bilirubin 0.6 0.2 - 1.2 mg/dL   Alkaline phosphatase (APISO) 61 35 - 144 U/L   AST 13 10 - 35 U/L   ALT 9 9 - 46 U/L      Assessment &  Plan:   Problem List Items Addressed This Visit   None Visit Diagnoses     Acute left flank pain    -  Primary   Relevant Orders   POCT urinalysis dipstick   Urine Culture   DG Abd 1 View   Urinary incontinence, unspecified type       Relevant Medications   cephALEXin (KEFLEX) 500 MG capsule   Other Relevant Orders   POCT urinalysis dipstick   Urine Culture   DG Abd 1 View   Abnormal urine odor       Relevant Medications   cephALEXin (KEFLEX) 500 MG capsule   Other Relevant Orders   POCT urinalysis dipstick   Urine Culture   Urinary frequency       Relevant Medications   cephALEXin (KEFLEX) 500 MG capsule   Other Relevant Orders   POCT urinalysis dipstick   Urine Culture       Clinically consistent with UTI but unable to get UA or Urine culture sample today Flank pain L sided questionable for nephrolithiasis but history not as suggestive No reported hematuria but had darker urine before Odor and symptoms suggest UTI Urinary incontinence No mental status changes at baseline has deficit with hearing and memory  Hemodynamically stable  Constipation > diarrhea now. Hyperactive bowels  Plan: Empiric Keflex 569m TID x 7 days Improve PO hydration KUB STAT today - negative for bowel issues constipation or kidney stone - sent results to mychart attempted to call  RTC if no improvement 1-2 weeks, red flags given to return sooner   Meds ordered this encounter  Medications   cephALEXin (KEFLEX) 500 MG capsule    Sig: Take 1 capsule (500 mg total) by mouth 3 (three) times daily. For 7 days    Dispense:  21 capsule    Refill:  0     Follow up plan: Return if symptoms worsen or fail to improve.   ANobie Putnam DO [facility] 01/06/2022, 8:10 AM

## 2022-01-06 NOTE — Patient Instructions (Addendum)
Thank you for coming to the office today.  1. You have a Urinary Tract Infection - this is very common, your symptoms are reassuring and you should get better within 1 week on the antibiotics - Start Keflex '500mg'$  3 times daily for next 7 days, complete entire course, even if feeling better - We sent urine for a culture, we will call you within next few days if we need to change antibiotics - Please drink plenty of fluids, improve hydration over next 1 week  Also X-ray of Abdomen to check for kidney stone or bowel blockages/issues.  If symptoms worsening, developing nausea / vomiting, worsening back pain, fevers / chills / sweats, then please return for re-evaluation sooner.  If you take AZO OTC - limit this to 2-3 days MAX to avoid affecting kidneys  D-Mannose is a natural supplement that can actually help bind to urinary bacteria and reduce their effectiveness it can help prevent UTI from forming, and may reduce some symptoms. It likely cannot cure an active UTI but it is worth a try and good to prevent them with. Try '500mg'$  twice a day at a full dose if you want, or check package instructions for more info   Please schedule a Follow-up Appointment to: Return if symptoms worsen or fail to improve.  If you have any other questions or concerns, please feel free to call the office or send a message through [facility]. You may also schedule an earlier appointment if necessary.  Additionally, you may be receiving a survey about your experience at our office within a few days to 1 week by e-mail or mail. We value your feedback.  Scott Putnam, DO [place]

## 2022-01-09 ENCOUNTER — Encounter: Payer: Self-pay | Admitting: Family Medicine

## 2022-01-17 ENCOUNTER — Inpatient Hospital Stay
Admission: EM | Admit: 2022-01-17 | Discharge: 2022-01-20 | DRG: 682 | Disposition: A | Discharge status: Home Health | Payer: Medicare Other | Attending: Osteopathic Medicine | Admitting: Osteopathic Medicine

## 2022-01-17 ENCOUNTER — Emergency Department: Payer: Medicare Other

## 2022-01-17 ENCOUNTER — Other Ambulatory Visit: Payer: Self-pay

## 2022-01-17 ENCOUNTER — Encounter: Payer: Self-pay | Admitting: Internal Medicine

## 2022-01-17 DIAGNOSIS — I129 Hypertensive chronic kidney disease with stage 1 through stage 4 chronic kidney disease, or unspecified chronic kidney disease: Secondary | ICD-10-CM | POA: Diagnosis present

## 2022-01-17 DIAGNOSIS — E860 Dehydration: Secondary | ICD-10-CM | POA: Diagnosis present

## 2022-01-17 DIAGNOSIS — M6281 Muscle weakness (generalized): Secondary | ICD-10-CM | POA: Diagnosis not present

## 2022-01-17 DIAGNOSIS — R54 Age-related physical debility: Secondary | ICD-10-CM | POA: Diagnosis present

## 2022-01-17 DIAGNOSIS — E43 Unspecified severe protein-calorie malnutrition: Secondary | ICD-10-CM | POA: Diagnosis not present

## 2022-01-17 DIAGNOSIS — G4700 Insomnia, unspecified: Secondary | ICD-10-CM | POA: Diagnosis not present

## 2022-01-17 DIAGNOSIS — N401 Enlarged prostate with lower urinary tract symptoms: Secondary | ICD-10-CM | POA: Diagnosis present

## 2022-01-17 DIAGNOSIS — Z1152 Encounter for screening for COVID-19: Secondary | ICD-10-CM | POA: Diagnosis not present

## 2022-01-17 DIAGNOSIS — E8721 Acute metabolic acidosis: Secondary | ICD-10-CM | POA: Diagnosis not present

## 2022-01-17 DIAGNOSIS — K449 Diaphragmatic hernia without obstruction or gangrene: Secondary | ICD-10-CM | POA: Diagnosis not present

## 2022-01-17 DIAGNOSIS — I10 Essential (primary) hypertension: Secondary | ICD-10-CM | POA: Diagnosis not present

## 2022-01-17 DIAGNOSIS — Z6823 Body mass index (BMI) 23.0-23.9, adult: Secondary | ICD-10-CM

## 2022-01-17 DIAGNOSIS — N179 Acute kidney failure, unspecified: Principal | ICD-10-CM | POA: Diagnosis present

## 2022-01-17 DIAGNOSIS — J9811 Atelectasis: Secondary | ICD-10-CM | POA: Diagnosis not present

## 2022-01-17 DIAGNOSIS — Z66 Do not resuscitate: Secondary | ICD-10-CM | POA: Diagnosis not present

## 2022-01-17 DIAGNOSIS — R531 Weakness: Secondary | ICD-10-CM

## 2022-01-17 DIAGNOSIS — H409 Unspecified glaucoma: Secondary | ICD-10-CM | POA: Diagnosis present

## 2022-01-17 DIAGNOSIS — K219 Gastro-esophageal reflux disease without esophagitis: Secondary | ICD-10-CM | POA: Diagnosis not present

## 2022-01-17 DIAGNOSIS — N138 Other obstructive and reflux uropathy: Secondary | ICD-10-CM | POA: Diagnosis present

## 2022-01-17 DIAGNOSIS — Z885 Allergy status to narcotic agent status: Secondary | ICD-10-CM | POA: Diagnosis not present

## 2022-01-17 DIAGNOSIS — N2889 Other specified disorders of kidney and ureter: Secondary | ICD-10-CM | POA: Diagnosis not present

## 2022-01-17 DIAGNOSIS — E7800 Pure hypercholesterolemia, unspecified: Secondary | ICD-10-CM | POA: Diagnosis present

## 2022-01-17 DIAGNOSIS — D72829 Elevated white blood cell count, unspecified: Secondary | ICD-10-CM | POA: Diagnosis present

## 2022-01-17 DIAGNOSIS — R339 Retention of urine, unspecified: Secondary | ICD-10-CM | POA: Diagnosis not present

## 2022-01-17 LAB — URINALYSIS, ROUTINE W REFLEX MICROSCOPIC
Bilirubin Urine: NEGATIVE
Glucose, UA: NEGATIVE mg/dL
Ketones, ur: NEGATIVE mg/dL
Nitrite: NEGATIVE
Protein, ur: NEGATIVE mg/dL
RBC / HPF: >50 RBC/hpf — ABNORMAL HIGH (ref 0–5)
Specific Gravity, Urine: 1.016 (ref 1.005–1.030)
WBC, UA: >50 WBC/hpf — ABNORMAL HIGH (ref 0–5)
pH: 5 (ref 5.0–8.0)

## 2022-01-17 LAB — CBC
HCT: 33.4 % — ABNORMAL LOW (ref 39.0–52.0)
Hemoglobin: 11 g/dL — ABNORMAL LOW (ref 13.0–17.0)
MCH: 27.8 pg (ref 26.0–34.0)
MCHC: 32.9 g/dL (ref 30.0–36.0)
MCV: 84.3 fL (ref 80.0–100.0)
Platelets: 377 10*3/uL (ref 150–400)
RBC: 3.96 MIL/uL — ABNORMAL LOW (ref 4.22–5.81)
RDW: 15 % (ref 11.5–15.5)
WBC: 16 10*3/uL — ABNORMAL HIGH (ref 4.0–10.5)
nRBC: 0 % (ref 0.0–0.2)

## 2022-01-17 LAB — BASIC METABOLIC PANEL
Anion gap: 14 (ref 5–15)
BUN: 148 mg/dL — ABNORMAL HIGH (ref 8–23)
CO2: 17 mmol/L — ABNORMAL LOW (ref 22–32)
Calcium: 9.2 mg/dL (ref 8.9–10.3)
Chloride: 104 mmol/L (ref 98–111)
Creatinine, Ser: 10.83 mg/dL — ABNORMAL HIGH (ref 0.61–1.24)
GFR, Estimated: 4 mL/min — ABNORMAL LOW (ref >60)
Glucose, Bld: 129 mg/dL — ABNORMAL HIGH (ref 70–99)
Potassium: 4.7 mmol/L (ref 3.5–5.1)
Sodium: 135 mmol/L (ref 135–145)

## 2022-01-17 LAB — RESP PANEL BY RT-PCR (RSV, FLU A&B, COVID)  RVPGX2
Influenza A by PCR: NEGATIVE
Influenza B by PCR: NEGATIVE
Resp Syncytial Virus by PCR: NEGATIVE
SARS Coronavirus 2 by RT PCR: NEGATIVE

## 2022-01-17 LAB — MAGNESIUM: Magnesium: 2.8 mg/dL — ABNORMAL HIGH (ref 1.7–2.4)

## 2022-01-17 LAB — LACTIC ACID, PLASMA: Lactic Acid, Venous: 1.8 mmol/L (ref 0.5–1.9)

## 2022-01-17 MED ORDER — MELATONIN 5 MG PO TABS: 5.0000 mg | ORAL_TABLET | Freq: Every evening | ORAL | Status: DC | PRN

## 2022-01-17 MED ORDER — ACETAMINOPHEN 650 MG RE SUPP: 650.0000 mg | Freq: Four times a day (QID) | RECTAL | Status: DC | PRN

## 2022-01-17 MED ORDER — LACTATED RINGERS IV BOLUS
1000.0000 mL | Freq: Once | INTRAVENOUS | Status: AC
  Administered 2022-01-17: 1000 mL via INTRAVENOUS

## 2022-01-17 MED ORDER — ACETAMINOPHEN 325 MG PO TABS
650.0000 mg | ORAL_TABLET | Freq: Four times a day (QID) | ORAL | Status: DC | PRN
  Administered 2022-01-19: 650 mg via ORAL
  Filled 2022-01-17: qty 2

## 2022-01-17 MED ORDER — TAMSULOSIN HCL 0.4 MG PO CAPS
0.4000 mg | ORAL_CAPSULE | Freq: Every day | ORAL | Status: DC
  Administered 2022-01-18 – 2022-01-20 (×3): 0.4 mg via ORAL
  Filled 2022-01-17 (×3): qty 1

## 2022-01-17 MED ORDER — ONDANSETRON HCL 4 MG/2ML IJ SOLN: 4.0000 mg | Freq: Four times a day (QID) | INTRAMUSCULAR | Status: DC | PRN

## 2022-01-17 MED ORDER — PANTOPRAZOLE SODIUM 40 MG PO TBEC
40.0000 mg | DELAYED_RELEASE_TABLET | Freq: Every day | ORAL | Status: DC
  Administered 2022-01-18 – 2022-01-20 (×3): 40 mg via ORAL
  Filled 2022-01-17 (×3): qty 1

## 2022-01-17 MED ORDER — HEPARIN SODIUM (PORCINE) 5000 UNIT/ML IJ SOLN
5000.0000 [IU] | Freq: Three times a day (TID) | INTRAMUSCULAR | Status: DC
  Administered 2022-01-18 – 2022-01-20 (×6): 5000 [IU] via SUBCUTANEOUS
  Filled 2022-01-17 (×6): qty 1

## 2022-01-17 MED ORDER — ONDANSETRON HCL 4 MG PO TABS: 4.0000 mg | ORAL_TABLET | Freq: Four times a day (QID) | ORAL | Status: DC | PRN

## 2022-01-17 MED ORDER — HYDRALAZINE HCL 20 MG/ML IJ SOLN: 5.0000 mg | Freq: Three times a day (TID) | INTRAMUSCULAR | Status: DC | PRN

## 2022-01-17 MED ORDER — LATANOPROST 0.005 % OP SOLN
1.0000 [drp] | Freq: Every day | OPHTHALMIC | Status: DC
  Administered 2022-01-18 – 2022-01-19 (×2): 1 [drp] via OPHTHALMIC
  Filled 2022-01-17 (×2): qty 2.5

## 2022-01-17 MED ORDER — ROSUVASTATIN CALCIUM 10 MG PO TABS
5.0000 mg | ORAL_TABLET | ORAL | Status: DC
  Administered 2022-01-20: 5 mg via ORAL
  Filled 2022-01-17: qty 1

## 2022-01-17 MED ORDER — SENNOSIDES-DOCUSATE SODIUM 8.6-50 MG PO TABS: 1.0000 | ORAL_TABLET | Freq: Every evening | ORAL | Status: DC | PRN

## 2022-01-17 MED ORDER — SUVOREXANT 20 MG PO TABS: 0.5000 | ORAL_TABLET | Freq: Every evening | ORAL | Status: DC | PRN

## 2022-01-17 NOTE — H&P (Addendum)
History and Physical   Scott Ashley SWF:[id number] DOB: [birth year] DOA: 01/17/2022  PCP: Olin Hauser, DO  Outpatient Specialists: Dr. Allen Norris, gastroenterology Patient coming from: Home via EMS  I have personally briefly reviewed patient's old medical records in [facility].  Chief Concern: Generalized weakness and difficulty urinating  HPI: Mr. Scott Ashley is a 88 year old male with history of hypertension, hyperlipidemia, GERD, benign prostate hypertrophy, recent urinary tract infection was prescribed Keflex on 01/06/2022, who presents emergency department for chief concerns of generalized weakness and difficulty urinating for the past week.  Initial vitals in the emergency department showed temperature of 98.2, respiration rate of 20, heart rate of 95, blood pressure 126/85, SpO2 of 98% on room air.  Serum sodium 135, potassium 4.7, chloride 104, bicarb 17, BUN of 148, serum creatinine of 10.83, EGFR of 4, nonfasting blood glucose 129, WBC 16, hemoglobin 11, platelets of 377.  Portable chest x-ray was read as: Minimal streaky bibasilar atelectasis.  Large hiatal hernia.  ED treatment: Lactated Ringer 1000 mL bolus. ----------------------- At bedside patient was able to tell me his name, his age, his current location of hospital, and identify his granddaughter Arby Barrette at bedside.  He is able to weakly respond to the above questions.  He is laying on the right lateral recumbent position and appears weak and awake alert and oriented fully.  He reports that over the last week he has not been eating or drinking.  He states that his belly has been hurting for 3 days.  He reports he has not urinated for 3 days.  Though Paige and/or neighbor at bedside reports that she changes his diaper daily and there has been urine in the diaper.  She states that at baseline he lives at home by himself and functions on his own.  He denies fever, chest pain.  He reports he has been taking  his medications daily especially his prostate medication.  Social history: He lives at home by himself.  He denies tobacco, EtOH, recreational drug use.  He is retired and formerly worked in [employer].  ROS: Constitutional: no weight change, no fever ENT/Mouth: no sore throat, no rhinorrhea Eyes: no eye pain, no vision changes Cardiovascular: no chest pain, no dyspnea,  no edema, no palpitations Respiratory: no cough, no sputum, no wheezing Gastrointestinal: no nausea, no vomiting, no diarrhea, no constipation Genitourinary: Abdominal pain/tenderness, no urinary incontinence, no dysuria, no hematuria, +difficulty urinating Musculoskeletal: no arthralgias, no myalgias Skin: no skin lesions, no pruritus, Neuro: + weakness, no loss of consciousness, no syncope Psych: no anxiety, no depression, + decrease appetite Heme/Lymph: no bruising, no bleeding  ED Course: Discussed with emergency medicine provider, patient requiring hospitalization for chief concerns of acute kidney injury presumed secondary to acute on chronic urinary retention.  Assessment/Plan  Principal Problem:   AKI (acute kidney injury) ([place]) Active Problems:   Pure hypercholesterolemia   BPH with obstruction/lower urinary tract symptoms   Insomnia   Essential hypertension   Acute on chronic urinary retention   Leukocytosis   Assessment and Plan:  * AKI (acute kidney injury) ([place]) - Presumptive etiology is secondary to urinary retention - Urology has been consulted - Status post LR 1 L bolus per EDP - Check lactic acid and UA with complete microscopy - I recommend a.m. team to consult urology should patient's renal function not improved post urology intervention and fluid administration on admission - BMP in the a.m.  Leukocytosis - Presumed secondary to volume contraction in setting of  dehydration - However given patient has urinary retention, I will check lactic acid, if positive I will initiate  broad-spectrum antibiotic - Check UA with complete microscopy, a.m. team to initiate antibiotic as appropriate - CBC in a.m.  Acute on chronic urinary retention - Urology has been consulted, they plan to see patient and plan to place a Foley catheter  Essential hypertension - Holding home bisoprolol-hydrochlorothiazide 5-6.25 mg p.o. daily due to acute kidney injury and mildly low blood pressure on admission - Per med reconciliation, patient took his antihypertensive medications prior to ED presentation - AM team to resume home antihypertensive medications when the benefits outweigh the risk - Hydralazine 5 mg IV every 8 hours as needed for SBP greater than 180, 5 days ordered  Insomnia - Patient takes Belsomra nightly as needed for sleep, this formulary is not available inpatient therefore has not been resumed on admission - Melatonin 5 mg nightly as needed for sleep, 5 days ordered  BPH with obstruction/lower urinary tract symptoms - Patient takes tamsulosin 0.4 mg daily resumed for 01/18/22  Pure hypercholesterolemia - Patient takes rosuvastatin 5 mg by mouth 2 times per week  Chart reviewed.   01/06/2022: Patient was seen outpatient at his PCP office and was diagnosed with UTI and prescribed Keflex.  Patient reports he completed the antibiotic and symptoms did not improve.  DVT prophylaxis: 5000 units subcutaneous every 8 hours starting on 01/18/2022 Code Status: DNR, granddaughter at bedside and acknowledged this Diet: Renal Family Communication: Granddaughter, Arby Barrette was at bedside Disposition Plan: Pending clinical course Consults called: Urology Admission status: Telemetry medical, inpatient  Past Medical History:  Diagnosis Date   Anxiety    Glaucoma    Hypertension    Urinary incontinence    Past Surgical History:  Procedure Laterality Date   COLONOSCOPY WITH PROPOFOL N/A 10/10/2017   Procedure: COLONOSCOPY WITH PROPOFOL;  Surgeon: Lucilla Lame, MD;  Location: [facility]  ENDOSCOPY;  Service: Endoscopy;  Laterality: N/A;   ESOPHAGOGASTRODUODENOSCOPY (EGD) WITH PROPOFOL N/A 10/10/2017   Procedure: ESOPHAGOGASTRODUODENOSCOPY (EGD) WITH PROPOFOL;  Surgeon: Lucilla Lame, MD;  Location: [facility] ENDOSCOPY;  Service: Endoscopy;  Laterality: N/A;   ESOPHAGOGASTRODUODENOSCOPY (EGD) WITH PROPOFOL N/A 10/06/2020   Procedure: ESOPHAGOGASTRODUODENOSCOPY (EGD) WITH PROPOFOL;  Surgeon: Lucilla Lame, MD;  Location: [facility] ENDOSCOPY;  Service: Endoscopy;  Laterality: N/A;   HERNIA REPAIR     twice   TONSILLECTOMY     Social History:  reports that he has never smoked. He has never used smokeless tobacco. He reports that he does not drink alcohol and does not use drugs.  Allergies  Allergen Reactions   Codeine Other (See Comments)   Family History  Problem Relation Age of Onset   ALS Mother    Family history: Family history reviewed and not pertinent  Prior to Admission medications   Medication Sig Start Date End Date Taking? Authorizing Provider  acetaminophen (TYLENOL) 500 MG tablet Take 500 mg by mouth every 6 (six) hours as needed.    [provider]  BELSOMRA 20 MG TABS TAKE 1/2 TO 1 TABLET BY MOUTH AT BEDTIME AS NEEDED 12/03/21   Parks Ranger, Devonne Doughty, DO  bisoprolol-hydrochlorothiazide (ZIAC) 5-6.25 MG tablet Take 1 tablet by mouth daily. 07/22/21   Karamalegos, Devonne Doughty, DO  cephALEXin (KEFLEX) 500 MG capsule Take 1 capsule (500 mg total) by mouth 3 (three) times daily. For 7 days 01/06/22   Olin Hauser, DO  fluticasone [facility]) 50 MCG/ACT nasal spray Place into both nostrils daily.  [provider]  ketoconazole (NIZORAL) 2 % cream Apply 1 application topically daily. qhs to feet, legs, buttocks and back 01/15/20   Ralene Bathe, MD  latanoprost (XALATAN) 0.005 % ophthalmic solution INT 1 GTT IN OU QHS 05/01/17   [provider]  omeprazole (PRILOSEC) 20 MG capsule TAKE 1 CAPSULE BY MOUTH DAILY 10/25/21   Parks Ranger,  Devonne Doughty, DO  rosuvastatin (CRESTOR) 5 MG tablet Take 1 tablet (5 mg total) by mouth 2 (two) times a week. 07/22/21   Olin Hauser, DO  Specialty Vitamins Products (PROSTATE PO) Take by mouth.    [provider]  tamsulosin (FLOMAX) 0.4 MG CAPS capsule TAKE 1 CAPSULE BY MOUTH DAILY 07/12/21   Olin Hauser, DO   Physical Exam: Vitals:   01/17/22 0952 01/17/22 0954 01/17/22 1121  BP: 126/85  134/82  Pulse: 95  84  Resp: 20  17  Temp: 98.2 F (36.8 C)    TempSrc: Oral    SpO2: 96%  98%  Weight:  63.5 kg   Height:  6' (1.829 m)    Constitutional: appears age-appropriate, frail, NAD, calm, comfortable Eyes: PERRL, lids and conjunctivae normal ENMT: Mucous membranes are moist. Posterior pharynx clear of any exudate or lesions. Age-appropriate dentition. Hearing appropriate.  Hearing aid in place and visible in the left ear Neck: normal, supple, no masses, no thyromegaly Respiratory: clear to auscultation bilaterally, no wheezing, no crackles. Normal respiratory effort. No accessory muscle use.  Cardiovascular: Regular rate and rhythm, no murmurs / rubs / gallops. No extremity edema. 2+ pedal pulses. No carotid bruits.  Abdomen: + tenderness at the umbilicus and suprapubic regions, distended bladder palpated, no hepatosplenomegaly. Bowel sounds positive.  Musculoskeletal: no clubbing / cyanosis. No joint deformity upper and lower extremities. Good ROM, no contractures, no atrophy. Normal muscle tone.  Skin: no rashes, lesions, ulcers. No induration Neurologic: Sensation intact. Strength 5/5 in all 4.  Psychiatric: Normal judgment and insight. Alert and oriented x 3.  Depressed mood.   EKG: independently reviewed, showing atrial fibrillation with rate of 79, QTc 399  Chest x-ray on Admission: I personally reviewed and I agree with radiologist reading as below.  DG Chest Portable 1 View  Result Date: 01/17/2022 CLINICAL DATA:  Weakness and difficulty  urinating. EXAM: PORTABLE CHEST 1 VIEW COMPARISON:  11/13/2017. FINDINGS: Patient is slightly rotated. Trachea is midline. Heart is enlarged, stable. Mild streaky bibasilar atelectasis. No airspace consolidation or pleural fluid. Large hiatal hernia. Old proximal left humerus fracture. IMPRESSION: 1. Minimal streaky bibasilar atelectasis. 2. Large hiatal hernia. Electronically Signed   By: Lorin Picket M.D.   On: 01/17/2022 12:00    Labs on Admission: I have personally reviewed following labs  CBC: Recent Labs  Lab 01/17/22 1001  WBC 16.0*  HGB 11.0*  HCT 33.4*  MCV 84.3  PLT 419   Basic Metabolic Panel: Recent Labs  Lab 01/17/22 1001  NA 135  K 4.7  CL 104  CO2 17*  GLUCOSE 129*  BUN 148*  CREATININE 10.83*  CALCIUM 9.2  MG 2.8*   GFR: Estimated Creatinine Clearance: 4.1 mL/min (A) (by C-G formula based on SCr of 10.83 mg/dL (H)).  Urine analysis:    Component Value Date/Time   COLORURINE YELLOW (A) 11/13/2017 1751   APPEARANCEUR Clear 12/18/2017 1128   LABSPEC 1.013 11/13/2017 1751   PHURINE 5.0 11/13/2017 1751   GLUCOSEU Negative 12/18/2017 1128   HGBUR NEGATIVE 11/13/2017 1751   BILIRUBINUR Negative 12/18/2017 1128  KETONESUR NEGATIVE 11/13/2017 1751   PROTEINUR Negative 12/18/2017 1128   PROTEINUR NEGATIVE 11/13/2017 1751   NITRITE Negative 12/18/2017 1128   NITRITE NEGATIVE 11/13/2017 1751   LEUKOCYTESUR Negative 12/18/2017 1128   Dr. Tobie Poet [facility]  If 7PM-7AM, please contact overnight-coverage provider If 7AM-7PM, please contact day coverage provider [email or web address]  01/17/2022, 3:04 PM

## 2022-01-17 NOTE — Hospital Course (Signed)
Mr. Scott Ashley is a 88-year-old male with history of hypertension, hyperlipidemia, GERD, benign prostate hypertrophy, recent urinary tract infection was prescribed Keflex on 01/06/2022, who presents emergency department for chief concerns of generalized weakness and difficulty urinating for the past week.  Initial vitals in the emergency department showed temperature of 98.2, respiration rate of 20, heart rate of 95, blood pressure 126/85, SpO2 of 98% on room air.  Serum sodium 135, potassium 4.7, chloride 104, bicarb 17, BUN of 148, serum creatinine of 10.83, EGFR of 4, nonfasting blood glucose 129, WBC 16, hemoglobin 11, platelets of 377.  Portable chest x-ray was read as: Minimal streaky bibasilar atelectasis.  Large hiatal hernia.  ED treatment: Lactated Ringer 1000 mL bolus. 

## 2022-01-17 NOTE — ED Provider Notes (Signed)
[facility] Provider Note    Event Date/Time   First MD Initiated Contact with Patient 01/17/22 1109     (approximate)   History   Chief Complaint Weakness   HPI  Scott Ashley is a 88 y.o. male with past medical history of hyperlipidemia, hypertension, CKD, and atrial fibrillation who presents to the ED complaining of weakness.  Patient's granddaughter is at bedside, states the patient has been increasingly weak with poor p.o. intake for the past 2 weeks.  He was prescribed Keflex empirically for UTI by his PCP, completed entire course of antibiotics without improvement.  Patient has not complained of any pain or difficulty breathing per granddaughter, but has become slower to respond and does not get up out of bed.  He typically lives by himself and is able to care for himself.  Patient currently denies any complaints, states that he feels "okay."  Daughter does report that he has been leaking urine consistently for the past 2 weeks, which is unusual for him.  Patient denies any dysuria or abdominal pain.     Physical Exam   Triage Vital Signs: ED Triage Vitals  Enc Vitals Group     BP 01/17/22 0952 126/85     Pulse Rate 01/17/22 0952 95     Resp 01/17/22 0952 20     Temp 01/17/22 0952 98.2 F (36.8 C)     Temp Source 01/17/22 0952 Oral     SpO2 01/17/22 0952 96 %     Weight 01/17/22 0954 140 lb (63.5 kg)     Height 01/17/22 0954 6' (1.829 m)     Head Circumference --      Peak Flow --      Pain Score 01/17/22 0953 3     Pain Loc --      Pain Edu? --      Excl. in [place]? --     Most recent vital signs: Vitals:   01/17/22 0952 01/17/22 1121  BP: 126/85 134/82  Pulse: 95 84  Resp: 20 17  Temp: 98.2 F (36.8 C)   SpO2: 96% 98%    Constitutional: Alert and oriented to person, place, time, and situation. Eyes: Conjunctivae are normal. Head: Atraumatic. Nose: No congestion/rhinnorhea. Mouth/Throat: Mucous membranes are moist.   Cardiovascular: Normal rate, regular rhythm. Grossly normal heart sounds.  2+ radial pulses bilaterally. Respiratory: Normal respiratory effort.  No retractions. Lungs CTAB. Gastrointestinal: Soft with markedly enlarged bladder with associated tenderness. Musculoskeletal: No lower extremity tenderness nor edema.  Neurologic:  Normal speech and language. No gross focal neurologic deficits are appreciated.    ED Results / Procedures / Treatments   Labs (all labs ordered are listed, but only abnormal results are displayed) Labs Reviewed  BASIC METABOLIC PANEL - Abnormal; Notable for the following components:      Result Value   CO2 17 (*)    Glucose, Bld 129 (*)    BUN 148 (*)    Creatinine, Ser 10.83 (*)    GFR, Estimated 4 (*)    All other components within normal limits  CBC - Abnormal; Notable for the following components:   WBC 16.0 (*)    RBC 3.96 (*)    Hemoglobin 11.0 (*)    HCT 33.4 (*)    All other components within normal limits  MAGNESIUM - Abnormal; Notable for the following components:   Magnesium 2.8 (*)    All other components within normal limits  RESP PANEL BY  RT-PCR (RSV, FLU A&B, COVID)  [id number]  URINALYSIS, ROUTINE W REFLEX MICROSCOPIC  CBG MONITORING, ED     EKG  ED ECG REPORT I, Blake Divine, the attending physician, personally viewed and interpreted this ECG.   Date: 01/17/2022  EKG Time: 9:57  Rate: 79  Rhythm: atrial fibrillation  Axis: Normal  Intervals:none  ST&T Change: None  RADIOLOGY Chest x-ray reviewed and interpreted by me with no infiltrate, edema, or effusion.  PROCEDURES:  Critical Care performed: Yes, see critical care procedure note(s)  .Critical Care  Performed by: Blake Divine, MD Authorized by: Blake Divine, MD   Critical care provider statement:    Critical care time (minutes):  30   Critical care time was exclusive of:  Separately billable procedures and treating other patients and teaching time    Critical care was necessary to treat or prevent imminent or life-threatening deterioration of the following conditions:  Renal failure   Critical care was time spent personally by me on the following activities:  Development of treatment plan with patient or surrogate, discussions with consultants, evaluation of patient's response to treatment, examination of patient, ordering and review of laboratory studies, ordering and review of radiographic studies, ordering and performing treatments and interventions, pulse oximetry, re-evaluation of patient's condition and review of old charts   I assumed direction of critical care for this patient from another provider in my specialty: no     Care discussed with: admitting provider      MEDICATIONS ORDERED IN ED: Medications  lactated ringers bolus 1,000 mL (0 mLs Intravenous Stopped 01/17/22 1229)     IMPRESSION / MDM / [place] / ED COURSE  I reviewed the triage vital signs and the nursing notes.                              88 y.o. male with past medical history of hypertension, hyperlipidemia, CKD, and atrial fibrillation who presents to the ED complaining of increasing generalized weakness with poor p.o. intake for the past 2 weeks, now incontinent of urine.  Patient's presentation is most consistent with acute presentation with potential threat to life or bodily function.  Differential diagnosis includes, but is not limited to, UTI, sepsis, pneumonia, AKI, electrolyte abnormality, anemia, ACS.  Patient ill-appearing but in no acute distress, vital signs are unremarkable and do not appear concerning for sepsis.  On exam, patient has large and distended bladder which is tender to palpation, suspect urinary retention now with overflow incontinence.  His weakness and malaise likely secondary to associated renal failure with BUN of 148 and creatinine greater than 10.  He is mildly acidotic with this but no acute electrolyte abnormality  noted.  He does have leukocytosis of 16, mild anemia noted but no evidence of bleeding at this time.  Bladder scan shows greater than 999 mL of urine and we will place Foley catheter, hydrate with IV fluids.  Chest x-ray without evidence of pneumonia or other acute process.  Nursing staff unable to place Foley catheter after multiple attempts, case discussed with Dr. Erlene Quan of urology and urology PA to come to the ED for Foley catheter placement.  Case discussed with hospitalist for admission.      FINAL CLINICAL IMPRESSION(S) / ED DIAGNOSES   Final diagnoses:  Acute renal failure, unspecified acute renal failure type [facility])  Urinary retention  Generalized weakness     Rx / DC Orders   ED Discharge  Orders     None        Note:  This document was prepared using Dragon voice recognition software and may include unintentional dictation errors.   Blake Divine, MD 01/17/22 1257

## 2022-01-17 NOTE — Plan of Care (Signed)
  Problem: Education: Goal: Knowledge of General Education information will improve Description: Including pain rating scale, medication(s)/side effects and non-pharmacologic comfort measures Outcome: Progressing   Problem: Health Behavior/Discharge Planning: Goal: Ability to manage health-related needs will improve Outcome: Progressing   Problem: Clinical Measurements: Goal: Will remain free from infection Outcome: Progressing Goal: Diagnostic test results will improve Outcome: Progressing   Problem: Pain Managment: Goal: General experience of comfort will improve Outcome: Progressing   Problem: Safety: Goal: Ability to remain free from injury will improve Outcome: Progressing   Problem: Skin Integrity: Goal: Risk for impaired skin integrity will decrease Outcome: Progressing   

## 2022-01-17 NOTE — Assessment & Plan Note (Addendum)
-   Patient takes tamsulosin 0.4 mg daily resumed for 01/18/22

## 2022-01-17 NOTE — Assessment & Plan Note (Addendum)
-   Holding home bisoprolol-hydrochlorothiazide 5-6.25 mg p.o. daily due to acute kidney injury and mildly low blood pressure on admission - Per med reconciliation, patient took his antihypertensive medications prior to ED presentation - AM team to resume home antihypertensive medications when the benefits outweigh the risk - Hydralazine 5 mg IV every 8 hours as needed for SBP greater than 180, 5 days ordered

## 2022-01-17 NOTE — Assessment & Plan Note (Signed)
Secondary to stress margination.  Given normal procalcitonin and no evidence of strong bacteria or nitrites on urinalysis, do not think that he currently has urinary tract infection.

## 2022-01-17 NOTE — Consult Note (Signed)
Urology Consult  I have been asked to see the patient by Dr. Tobie Poet, for evaluation and management of difficult Foley placement.  Chief Complaint: Generalized weakness, difficulty urinating  History of Present Illness: Scott Ashley is a 88 y.o. year old male with PMH BPH with prostatomegaly on Flomax and small left enhancing renal mass on surveillance previously managed by Dr. Diamantina Providence who presented to the ED this morning with reports of generalized weakness, poor p.o. intake, and new urinary leakage x 2 weeks.  He had been treated for UTI by his PCP with empiric Keflex after they were unable to obtain a urine sample and family noted no symptomatic improvement despite antibiotic therapy.  Labs notable for creatinine 10.83 (baseline 1.1); WBC count 16.0; lactate 1.8.  A bladder scan revealed >945m.  Nursing attempted to place a [place] and 20 [place] catheter, both of which failed with significant resistance met at the level of the prostate.  He had some urethral bleeding with this.  Today he reports that his recent onset of urinary incontinence began in the setting of taking laxatives for constipation.  He states that his constipation has since resolved.  His granddaughter is at the bedside, who states that he tends to fixate on his bowel function and takes laxatives every time he does not have a daily bowel movement.  She is unsure if he had true worsening constipation recently.  He was seen most recently by Dr. SDiamantina Providencein November 2020.  He deferred follow-up with u[place] to concerns for COVID and his PCP has been prescribing his Flomax in the interim.  Past Medical History:  Diagnosis Date   Anxiety    Glaucoma    Hypertension    Urinary incontinence     Past Surgical History:  Procedure Laterality Date   COLONOSCOPY WITH PROPOFOL N/A 10/10/2017   Procedure: COLONOSCOPY WITH PROPOFOL;  Surgeon: WLucilla Lame MD;  [facility];  Service: Endoscopy;  Laterality:  N/A;   ESOPHAGOGASTRODUODENOSCOPY (EGD) WITH PROPOFOL N/A 10/10/2017   Procedure: ESOPHAGOGASTRODUODENOSCOPY (EGD) WITH PROPOFOL;  Surgeon: WLucilla Lame MD;  Location: [facility] ENDOSCOPY;  Service: Endoscopy;  Laterality: N/A;   ESOPHAGOGASTRODUODENOSCOPY (EGD) WITH PROPOFOL N/A 10/06/2020   Procedure: ESOPHAGOGASTRODUODENOSCOPY (EGD) WITH PROPOFOL;  Surgeon: WLucilla Lame MD;  Location: [facility] ENDOSCOPY;  Service: Endoscopy;  Laterality: N/A;   HERNIA REPAIR     twice   TONSILLECTOMY      Home Medications:  Current Meds  Medication Sig   BELSOMRA 20 MG TABS TAKE 1/2 TO 1 TABLET BY MOUTH AT BEDTIME AS NEEDED   bisoprolol-hydrochlorothiazide (ZIAC) 5-6.25 MG tablet Take 1 tablet by mouth daily.   latanoprost (XALATAN) 0.005 % ophthalmic solution Place 1 drop into both eyes at bedtime.   omeprazole (PRILOSEC) 20 MG capsule TAKE 1 CAPSULE BY MOUTH DAILY (Patient taking differently: Take 20 mg by mouth daily.)   rosuvastatin (CRESTOR) 5 MG tablet Take 1 tablet (5 mg total) by mouth 2 (two) times a week.   tamsulosin (FLOMAX) 0.4 MG CAPS capsule TAKE 1 CAPSULE BY MOUTH DAILY (Patient taking differently: Take 0.4 mg by mouth daily.)    Allergies:  Allergies  Allergen Reactions   Codeine Other (See Comments)    Family History  Problem Relation Age of Onset   ALS Mother     Social History:  reports that he has never smoked. He has never used smokeless tobacco. He reports that he does not drink alcohol and does not use drugs.  ROS: A complete review of systems was performed.  All systems are negative except for pertinent findings as noted.  Physical Exam:  Vital signs in last 24 hours: Temp:  [98 F (36.7 C)-98.2 F (36.8 C)] 98 F (36.7 C) (12/11 1649) Pulse Rate:  [83-95] 83 (12/11 1645) Resp:  [15-20] 15 (12/11 1645) BP: (126-141)/(76-85) 141/76 (12/11 1645) SpO2:  [96 %-99 %] 99 % (12/11 1645) Weight:  [63.5 kg] 63.5 kg (12/11 0954) Constitutional:  Alert and oriented, no acute  distress HEENT:  AT, moist mucus membranes Cardiovascular: No clubbing, cyanosis, or edema Respiratory: Normal respiratory effort GU: Distended abdomen, palpable bladder below the umbilicus Skin: No rashes, bruises or suspicious lesions Neurologic: Grossly intact, no focal deficits, moving all 4 extremities Psychiatric: Normal mood and affect  Laboratory Data:  Recent Labs    01/17/22 1001  WBC 16.0*  HGB 11.0*  HCT 33.4*   Recent Labs    01/17/22 1001  NA 135  K 4.7  CL 104  CO2 17*  GLUCOSE 129*  BUN 148*  CREATININE 10.83*  CALCIUM 9.2   Urinalysis    Component Value Date/Time   COLORURINE YELLOW (A) 11/13/2017 1751   APPEARANCEUR Clear 12/18/2017 1128   LABSPEC 1.013 11/13/2017 1751   PHURINE 5.0 11/13/2017 1751   GLUCOSEU Negative 12/18/2017 1128   HGBUR NEGATIVE 11/13/2017 1751   BILIRUBINUR Negative 12/18/2017 1128   KETONESUR NEGATIVE 11/13/2017 1751   PROTEINUR Negative 12/18/2017 [place] 11/13/2017 1751   NITRITE Negative 12/18/2017 1128   NITRITE NEGATIVE 11/13/2017 1751   LEUKOCYTESUR Negative 12/18/2017 1128   Results for orders placed or performed during the hospital encounter of 01/17/22  Resp panel by RT-PCR (RSV, Flu A&B, Covid) Anterior Nasal Swab     Status: None   Collection Time: 01/17/22 12:28 PM   Specimen: Anterior Nasal Swab  Result Value Ref Range Status   SARS Coronavirus 2 by RT PCR NEGATIVE NEGATIVE Final    Comment: (NOTE) SARS-CoV-2 target nucleic acids are NOT DETECTED.  The SARS-CoV-2 RNA is generally detectable in upper respiratory specimens during the acute phase of infection. The lowest concentration of SARS-CoV-2 viral copies this assay can detect is 138 copies/mL. A negative result does not preclude SARS-Cov-2 infection and should not be used as the sole basis for treatment or other patient management decisions. A negative result may occur with  improper specimen collection/handling, submission of  specimen other than nasopharyngeal swab, presence of viral mutation(s) within the areas targeted by this assay, and inadequate number of viral copies(<138 copies/mL). A negative result must be combined with clinical observations, patient history, and epidemiological information. The expected result is Negative.  Fact Sheet for Patients:  EntrepreneurPulse.com.au  Fact Sheet for Healthcare Providers:  IncredibleEmployment.be  This test is no t yet approved or cleared by the [place] FDA and  has been authorized for detection and/or diagnosis of SARS-CoV-2 by FDA under an Emergency Use Authorization (EUA). This EUA will remain  in effect (meaning this test can be used) for the duration of the COVID-19 declaration under Section 564(b)(1) of the Act, 21 U.S.C.section 360bbb-3(b)(1), unless the authorization is terminated  or revoked sooner.       Influenza A by PCR NEGATIVE NEGATIVE Final   Influenza B by PCR NEGATIVE NEGATIVE Final    Comment: (NOTE) The Xpert Xpress SARS-CoV-2/FLU/RSV plus assay is intended as an aid in the diagnosis of influenza from Nasopharyngeal swab specimens and should not be used as a  sole basis for treatment. Nasal washings and aspirates are unacceptable for Xpert Xpress SARS-CoV-2/FLU/RSV testing.  Fact Sheet for Patients: EntrepreneurPulse.com.au  Fact Sheet for Healthcare Providers: IncredibleEmployment.be  This test is not yet approved or cleared by the [place] FDA and has been authorized for detection and/or diagnosis of SARS-CoV-2 by FDA under an Emergency Use Authorization (EUA). This EUA will remain in effect (meaning this test can be used) for the duration of the COVID-19 declaration under Section 564(b)(1) of the Act, 21 U.S.C. section 360bbb-3(b)(1), unless the authorization is terminated or revoked.     Resp Syncytial Virus by PCR NEGATIVE NEGATIVE Final     Comment: (NOTE) Fact Sheet for Patients: EntrepreneurPulse.com.au  Fact Sheet for Healthcare Providers: IncredibleEmployment.be  This test is not yet approved or cleared by the [place] FDA and has been authorized for detection and/or diagnosis of SARS-CoV-2 by FDA under an Emergency Use Authorization (EUA). This EUA will remain in effect (meaning this test can be used) for the duration of the COVID-19 declaration under Section 564(b)(1) of the Act, 21 U.S.C. section 360bbb-3(b)(1), unless the authorization is terminated or revoked.  Performed at [facility], [address] [place], [place] [address]     Radiologic Imaging: DG Chest Portable 1 View  Result Date: 01/17/2022 CLINICAL DATA:  Weakness and difficulty urinating. EXAM: PORTABLE CHEST 1 VIEW COMPARISON:  11/13/2017. FINDINGS: Patient is slightly rotated. Trachea is midline. Heart is enlarged, stable. Mild streaky bibasilar atelectasis. No airspace consolidation or pleural fluid. Large hiatal hernia. Old proximal left humerus fracture. IMPRESSION: 1. Minimal streaky bibasilar atelectasis. 2. Large hiatal hernia. Electronically Signed   By: Lorin Picket M.D.   On: 01/17/2022 12:00    Procedures: Simple Catheter Placement  Due to urinary retention patient is present today for a foley cath placement.  Patient was cleaned and prepped in a sterile fashion with betadine and 2% lidocaine jelly was instilled into the urethra. A 18 FR coude foley catheter was inserted, urine return was noted  1100+ml, urine was yellow in color.  The balloon was filled with 10cc of sterile water.  A night bag was attached for drainage. Patient tolerated well, no complications were noted.  Performed by: Debroah Loop, PA-C   Assessment & Plan:  88 year old male with PMH BPH with prostatomegaly on Flomax and small enhancing left renal mass on surveillance admitted with acute renal  failure due to massive urinary retention, possibly in the setting of constipation.  I was able to place a Foley catheter at the bedside today, see above.  He did have some urethral bleeding following insertion, but no gross hematuria.  There was significant resistance met at the level of the prostate consistent with his known prostatomegaly.  Please note that with rapid bladder decompression, he will be at risk for hematuria, postobstructive diuresis, and hypotension.  Recommend closely following of his electrolytes and vitals over the next 24 to 48 hours.  Agree with UA and and antibiotics as indicated.  Recommend Foley catheter to remain in place at least 7 days given significant stretch injury of the detrusor muscle.  Will plan for outpatient voiding trial in our clinic.  Recommend continuing Flomax, will defer increasing his dose due to age and frailty.  If he fails an outpatient voiding trial, we will need to consider bladder outlet procedures including PAE.  Recommendations: -Continue Flomax 0.49m daily -Follow UA, consider culture, and antibiotics as indicated for possible UTI -Continue Foley catheter at least 7 days with plans  for outpatient voiding trial (I will arrange) -Trend renal function, electrolytes, and BP  Thank you for involving me in this patient's care, please page with any further questions or concerns.  Debroah Loop, PA-C 01/17/2022 4:56 PM

## 2022-01-17 NOTE — Assessment & Plan Note (Addendum)
-   Patient takes Belsomra nightly as needed for sleep, this formulary is not available inpatient therefore has not been resumed on admission - Melatonin 5 mg nightly as needed for sleep, 5 days ordered

## 2022-01-17 NOTE — Assessment & Plan Note (Addendum)
Secondary to urinary retention.  Status post Foley catheter placement and already creatinine down to half of number on presentation.  Continue gentle IV fluids.  Recheck labs in the morning.  Plan is for patient to keep Foley catheter and follow-up with urology in 1 week on Flomax.

## 2022-01-17 NOTE — ED Notes (Signed)
>  999 on bladder scan, [place] notified. Attempted to insert foley, unsuccessful at this time. Pt family reporting BPH. Urinary cart ordered for coude.

## 2022-01-17 NOTE — ED Notes (Signed)
Transport arrival to take patient to room. Daughter at the bedside and to follow.

## 2022-01-17 NOTE — ED Notes (Signed)
X1 attempt with 22ffoley unsuccessful. X1 attempt with 298foude unsuccessful. Provider notified.

## 2022-01-17 NOTE — Assessment & Plan Note (Addendum)
Status post Foley catheter placement.  Given normal procalcitonin, no evidence of urinary tract infection at this time, consideration for suppressive antibiotic at time of discharge until patient sees urology

## 2022-01-17 NOTE — Assessment & Plan Note (Signed)
-   Patient takes rosuvastatin 5 mg by mouth 2 times per week

## 2022-01-17 NOTE — ED Triage Notes (Signed)
Pt to ED via [facility] from home for generalized weakness and difficulty with urination. Pt recently finished antibiotics for possible UTI but pt has not gotten any better. Pt VSS in triage.

## 2022-01-18 DIAGNOSIS — I10 Essential (primary) hypertension: Secondary | ICD-10-CM | POA: Diagnosis not present

## 2022-01-18 DIAGNOSIS — N179 Acute kidney failure, unspecified: Secondary | ICD-10-CM | POA: Diagnosis not present

## 2022-01-18 DIAGNOSIS — N401 Enlarged prostate with lower urinary tract symptoms: Secondary | ICD-10-CM

## 2022-01-18 DIAGNOSIS — N138 Other obstructive and reflux uropathy: Secondary | ICD-10-CM

## 2022-01-18 DIAGNOSIS — E43 Unspecified severe protein-calorie malnutrition: Secondary | ICD-10-CM | POA: Diagnosis present

## 2022-01-18 DIAGNOSIS — R339 Retention of urine, unspecified: Secondary | ICD-10-CM | POA: Diagnosis not present

## 2022-01-18 LAB — BASIC METABOLIC PANEL
Anion gap: 9 (ref 5–15)
BUN: 109 mg/dL — ABNORMAL HIGH (ref 8–23)
CO2: 21 mmol/L — ABNORMAL LOW (ref 22–32)
Calcium: 9.2 mg/dL (ref 8.9–10.3)
Chloride: 108 mmol/L (ref 98–111)
Creatinine, Ser: 5.74 mg/dL — ABNORMAL HIGH (ref 0.61–1.24)
GFR, Estimated: 9 mL/min — ABNORMAL LOW (ref >60)
Glucose, Bld: 97 mg/dL (ref 70–99)
Potassium: 3.7 mmol/L (ref 3.5–5.1)
Sodium: 138 mmol/L (ref 135–145)

## 2022-01-18 LAB — CBC
HCT: 32.9 % — ABNORMAL LOW (ref 39.0–52.0)
Hemoglobin: 11 g/dL — ABNORMAL LOW (ref 13.0–17.0)
MCH: 28.3 pg (ref 26.0–34.0)
MCHC: 33.4 g/dL (ref 30.0–36.0)
MCV: 84.6 fL (ref 80.0–100.0)
Platelets: 366 10*3/uL (ref 150–400)
RBC: 3.89 MIL/uL — ABNORMAL LOW (ref 4.22–5.81)
RDW: 15.1 % (ref 11.5–15.5)
WBC: 14 10*3/uL — ABNORMAL HIGH (ref 4.0–10.5)
nRBC: 0 % (ref 0.0–0.2)

## 2022-01-18 LAB — PROCALCITONIN: Procalcitonin: <0.1 ng/mL

## 2022-01-18 MED ORDER — ADULT MULTIVITAMIN W/MINERALS CH
1.0000 | ORAL_TABLET | Freq: Every day | ORAL | Status: DC
  Administered 2022-01-18 – 2022-01-20 (×3): 1 via ORAL
  Filled 2022-01-18 (×3): qty 1

## 2022-01-18 MED ORDER — SODIUM CHLORIDE 0.9 % IV SOLN: INTRAVENOUS | Status: DC

## 2022-01-18 MED ORDER — ENSURE ENLIVE PO LIQD
237.0000 mL | Freq: Three times a day (TID) | ORAL | Status: DC
  Administered 2022-01-18 – 2022-01-20 (×6): 237 mL via ORAL

## 2022-01-18 NOTE — Plan of Care (Signed)
  Problem: Clinical Measurements: Goal: Ability to maintain clinical measurements within normal limits will improve Outcome: Progressing   

## 2022-01-18 NOTE — Assessment & Plan Note (Signed)
Nutrition Status: Nutrition Problem: Severe Malnutrition Etiology: social / environmental circumstances Signs/Symptoms: severe muscle depletion, severe fat depletion Interventions: Ensure Enlive (each supplement provides 350kcal and 20 grams of protein), MVI, Liberalize Diet

## 2022-01-18 NOTE — Assessment & Plan Note (Signed)
Continue home eyedrops °

## 2022-01-18 NOTE — Progress Notes (Signed)
Cardiac monitoring discontinued per Dr Lyman Speller order.

## 2022-01-18 NOTE — Progress Notes (Addendum)
[facility] Progress Note  Patient: Scott Ashley    WYO:[id number]  DOA: 01/17/2022    Date of Service: the patient was seen and examined on 01/18/2022  Brief hospital course: 88 year old male with history of hypertension, benign prostate hypertrophy, & recent urinary tract infection who was prescribed Keflex on 01/06/2022 and presented to the emergency department for chief concerns of generalized weakness and difficulty urinating for the past week.  Workup revealed acute kidney injury with creatinine of 10.7 (normal baseline) secondary to urinary retention.  Nursing unable to place Foley or coud catheter and urology consulted and placed.    Assessment and Plan: * AKI (acute kidney injury) ([place]) Secondary to urinary retention.  Status post Foley catheter placement and already creatinine down to half of number on presentation.  Continue gentle IV fluids.  Recheck labs in the morning.  Plan is for patient to keep Foley catheter and follow-up with urology in 1 week on Flomax.  Acute on chronic urinary retention Status post Foley catheter placement.  Given normal procalcitonin, no evidence of urinary tract infection at this time, consideration for suppressive antibiotic at time of discharge until patient sees urology  BPH with obstruction/lower urinary tract symptoms - Patient takes tamsulosin 0.4 mg daily resumed for 01/18/22  Leukocytosis Secondary to stress margination.  Given normal procalcitonin and no evidence of strong bacteria or nitrites on urinalysis, do not think that he currently has urinary tract infection.  Essential hypertension - Holding home bisoprolol-hydrochlorothiazide 5-6.25 mg p.o. daily due to acute kidney injury and mildly low blood pressure on admission  Glaucoma (increased eye pressure) Continue home eyedrops  Insomnia - Patient takes Belsomra nightly as needed for sleep, this formulary is not available inpatient therefore has not been resumed on  admission - Melatonin 5 mg nightly as needed for sleep, 5 days ordered  Protein-calorie malnutrition, severe Nutrition Status: Nutrition Problem: Severe Malnutrition Etiology: social / environmental circumstances Signs/Symptoms: severe muscle depletion, severe fat depletion Interventions: Ensure Enlive (each supplement provides 350kcal and 20 grams of protein), MVI, Liberalize Diet    Pure hypercholesterolemia - Patient takes rosuvastatin 5 mg by mouth 2 times per week       Body mass index is 23.79 kg/m.  Nutrition Problem: Severe Malnutrition Etiology: social / environmental circumstances     Consultants: Urology  Procedures: Placement of Foley catheter 12/11  Antimicrobials: None  Code Status: DNR   Subjective: Patient doing okay, complains of being tired  Objective: Vital signs were reviewed and unremarkable. Vitals:   01/18/22 0457 01/18/22 1337  BP: 118/64 (!) 108/50  Pulse: 79 80  Resp: 17 18  Temp: 98.8 F (37.1 C) 98.3 F (36.8 C)  SpO2: 96% 96%    Intake/Output Summary (Last 24 hours) at 01/18/2022 1455 Last data filed at 01/18/2022 0900 Gross per 24 hour  Intake --  Output 4200 ml  Net -4200 ml   Filed Weights   01/17/22 0954 01/17/22 2147  Weight: 63.5 kg 68.9 kg   Body mass index is 23.79 kg/m.  Exam:  General: Alert oriented x 2, fatigued HEENT: Normocephalic and atraumatic, mucous membranes are slightly dry Cardiovascular: Regular rate and rhythm, S1-S2 Respiratory: Clear to auscultation bilaterally Abdomen: Soft, nontender, nondistended, hypoactive bowel sounds Musculoskeletal: No clubbing or cyanosis Skin: No skin breaks, tears or lesions appropriate Psychiatry: Appropriate, no evidence of psychoses Neurology: No focal deficits  Data Reviewed: Creatinine down to 5.74  Disposition:  Status is: Inpatient Remains inpatient appropriate because:  -Continued improvement in creatinine  Anticipated discharge date:  12/14  Family Communication: Will call family DVT Prophylaxis: heparin injection 5,000 Units Start: 01/18/22 0600 Place TED hose Start: 01/17/22 1309    Author: Annita Brod ,MD 01/18/2022 2:55 PM  To reach On-call, see care teams to locate the attending and reach out via [email or web address]. Between 7PM-7AM, please contact night-coverage If you still have difficulty reaching the attending provider, please page the [facility] (Director on Call) for [facility] on amion for assistance.

## 2022-01-18 NOTE — Progress Notes (Signed)
Initial Nutrition Assessment  DOCUMENTATION CODES:   Severe malnutrition in context of social or environmental circumstances  INTERVENTION:   -Ensure Enlive po TID, each supplement provides 350 kcal and 20 grams of protein -MVI with minerals daily -Liberalize diet to 2 gram sodium for wider variety of meal selections  NUTRITION DIAGNOSIS:   Severe Malnutrition related to social / environmental circumstances as evidenced by severe muscle depletion, severe fat depletion.  GOAL:   Patient will meet greater than or equal to 90% of their needs  MONITOR:   PO intake, Supplement acceptance  REASON FOR ASSESSMENT:   Malnutrition Screening Tool    ASSESSMENT:   Pt with history of hypertension, hyperlipidemia, GERD, benign prostate hypertrophy, recent urinary tract infection who presents for chief concerns of generalized weakness and difficulty urinating for the past week.  Pt admitted with AKI.   Reviewed I/O's: -2.3 L x 24 hours  UOP: 3.3 L x 24 hours   Pt lying in bed at time of visit. Pt confused and unable to provide history. Pt fixated with bed controls. Assisted pt with adjusting bed and turning on the lights. RD unable to obtain further history at this time.   Reviewed wt hx; pt has experienced a 4.4% wt loss over the past year, which is not significant for time frame.   Pt is currently on a renal diet which is very restrictive and designed for pts on HD. RD will liberalized diet due to poor oral intake and malnutrition.   Medications reviewed.   Lab Results  Component Value Date   HGBA1C 5.6 07/22/2021   PTA DM medications are none.   Labs reviewed.   NUTRITION - FOCUSED PHYSICAL EXAM:  Flowsheet Row Most Recent Value  Orbital Region Severe depletion  Upper Arm Region Severe depletion  Thoracic and Lumbar Region Severe depletion  Buccal Region Severe depletion  Temple Region Severe depletion  Clavicle Bone Region Severe depletion  Clavicle and Acromion  Bone Region Severe depletion  Scapular Bone Region Severe depletion  Dorsal Hand Severe depletion  Patellar Region Severe depletion  Anterior Thigh Region Severe depletion  Posterior Calf Region Severe depletion  Edema (RD Assessment) None  Hair Reviewed  Eyes Reviewed  Mouth Reviewed  Skin Reviewed  Nails Reviewed       Diet Order:   Diet Order             Diet 2 gram sodium Fluid consistency: Thin  Diet effective now                   EDUCATION NEEDS:   Not appropriate for education at this time  Skin:  Skin Assessment: Reviewed RN Assessment  Last BM:  01/16/22  Height:   Ht Readings from Last 1 Encounters:  01/17/22 '5\' 7"'$  (1.702 m)    Weight:   Wt Readings from Last 1 Encounters:  01/17/22 68.9 kg    Ideal Body Weight:  67.3 kg  BMI:  Body mass index is 23.79 kg/m.  Estimated Nutritional Needs:   Kcal:  1850-2050  Protein:  90-105 grams  Fluid:  > 1.8 L    Loistine Chance, RD, LDN, [place] Registered Dietitian II Certified Diabetes Care and Education Specialist Please refer to [facility] for RD and/or RD on-call/weekend/after hours pager

## 2022-01-19 DIAGNOSIS — N179 Acute kidney failure, unspecified: Secondary | ICD-10-CM | POA: Diagnosis not present

## 2022-01-19 LAB — BASIC METABOLIC PANEL
Anion gap: 4 — ABNORMAL LOW (ref 5–15)
BUN: 66 mg/dL — ABNORMAL HIGH (ref 8–23)
CO2: 23 mmol/L (ref 22–32)
Calcium: 9.2 mg/dL (ref 8.9–10.3)
Chloride: 115 mmol/L — ABNORMAL HIGH (ref 98–111)
Creatinine, Ser: 1.7 mg/dL — ABNORMAL HIGH (ref 0.61–1.24)
GFR, Estimated: 38 mL/min — ABNORMAL LOW (ref >60)
Glucose, Bld: 133 mg/dL — ABNORMAL HIGH (ref 70–99)
Potassium: 3.3 mmol/L — ABNORMAL LOW (ref 3.5–5.1)
Sodium: 142 mmol/L (ref 135–145)

## 2022-01-19 LAB — CBC
HCT: 30.8 % — ABNORMAL LOW (ref 39.0–52.0)
Hemoglobin: 10.2 g/dL — ABNORMAL LOW (ref 13.0–17.0)
MCH: 28 pg (ref 26.0–34.0)
MCHC: 33.1 g/dL (ref 30.0–36.0)
MCV: 84.6 fL (ref 80.0–100.0)
Platelets: 325 10*3/uL (ref 150–400)
RBC: 3.64 MIL/uL — ABNORMAL LOW (ref 4.22–5.81)
RDW: 14.6 % (ref 11.5–15.5)
WBC: 10.2 10*3/uL (ref 4.0–10.5)
nRBC: 0 % (ref 0.0–0.2)

## 2022-01-19 MED ORDER — CHLORHEXIDINE GLUCONATE CLOTH 2 % EX PADS
6.0000 | MEDICATED_PAD | Freq: Every day | CUTANEOUS | Status: DC
  Administered 2022-01-20: 6 via TOPICAL

## 2022-01-19 NOTE — Progress Notes (Signed)
PROGRESS NOTE    Scott Ashley   YWV:[id number] DOB: 22-Jun-1929  DOA: 01/17/2022 Date of Service: 01/19/22 PCP: Olin Hauser, DO     Brief Narrative / Hospital Course:  88 year old male with history of hypertension, BPH, & recent UTI who was prescribed Keflex on 01/06/2022 and presented to the emergency department for chief concerns of generalized weakness and difficulty urinating for the past week.  Workup revealed acute kidney injury with creatinine of 10.7 (normal baseline) secondary to urinary retention.  Nursing unable to place Foley or coud catheter and urology consulted and placed one 01/17/22. Creatinine slowly improving over next few days down to 1.7 w/ GFR 38 today 01/19/22, WBC also normalized today. Baseline Cr 1.0-1.3. PT/OT recs for SNF, family deciding on this vs HH, TOC following. D/C IVF, anticipate continued improvement in renal function and medical stability for d/c tomorrow 12/14    Consultants:  Urology  Procedures: Placement of Foley catheter 12/11       ASSESSMENT & PLAN:   Principal Problem:   AKI (acute kidney injury) ([place]) Active Problems:   Acute on chronic urinary retention   BPH with obstruction/lower urinary tract symptoms   Leukocytosis   Essential hypertension   Glaucoma (increased eye pressure)   Insomnia   Pure hypercholesterolemia   Protein-calorie malnutrition, severe   AKI (acute kidney injury) ([place]) - improving  Secondary to urinary retention.   Status post Foley catheter placement  Trending Cr down nicely Plan is for patient to keep Foley catheter and follow-up with urology in 1 week on Flomax.   Acute on chronic urinary retention BPH with obstruction/lower urinary tract symptoms Patient takes tamsulosin 0.4 mg daily resumed for 01/18/22 Status post Foley catheter placement.   no evidence of urinary tract infection now consideration for suppressive antibiotic at time of discharge until patient sees urology    Leukocytosis - resolved Secondary to stress margination.  Given normal procalcitonin and no evidence of strong bacteria or nitrites on urinalysis, do not think that he currently has urinary tract infection.   Essential hypertension Holding home bisoprolol-hydrochlorothiazide 5-6.25 mg p.o. daily due to acute kidney injury and mildly low blood pressure on admission   Glaucoma (increased eye pressure) Continue home eyedrops   Insomnia Patient takes Belsomra nightly as needed for sleep, this formulary is not available inpatient therefore has not been resumed on admission Melatonin 5 mg nightly as needed for sleep, 5 days ordered   Protein-calorie malnutrition, severe Nutrition Status: Nutrition Problem: Severe Malnutrition Etiology: social / environmental circumstances Signs/Symptoms: severe muscle depletion, severe fat depletion Interventions: Ensure Enlive (each supplement provides 350kcal and 20 grams of protein), MVI, Liberalize Diet   Pure hypercholesterolemia Patient takes rosuvastatin 5 mg by mouth 2 times per week      DVT prophylaxis: heparin q8h Pertinent IV fluids/nutrition: NS 50 mL/h stopped today  Central lines / invasive devices: foley catheter  Code Status: DNR  Disposition: inpateint TOC needs: SNF vs HH  Barriers to discharge / significant pending items: recheck Cr tomorrow off IVF, family to decide on SNF/HH              Subjective:  Patient reports no complaints Family present - no concerns at this time Denies CP/SOB.  Pain controlled.  Denies new focal weakness.  Tolerating diet.    Family Communication: family at bedside on rounds     Objective Findings:  Vitals:   01/18/22 1923 01/19/22 0408 01/19/22 0751 01/19/22 1652  BP: 129/72 128/72 (!) 143/66  121/67  Pulse: 90 82 86 (!) 108  Resp: '18 17 16 16  '$ Temp: 98 F (36.7 C) 99.1 F (37.3 C) 98.4 F (36.9 C) 99.1 F (37.3 C)  TempSrc:   Oral   SpO2: 98% 96% 96% 95%  Weight:       Height:        Intake/Output Summary (Last 24 hours) at 01/19/2022 1653 Last data filed at 01/19/2022 1600 Gross per 24 hour  Intake 860.48 ml  Output 2950 ml  Net -2089.52 ml   Filed Weights   01/17/22 0954 01/17/22 2147  Weight: 63.5 kg 68.9 kg    Examination:  Constitutional:  VS as above General Appearance: alert, frail, NAD Respiratory: Normal respiratory effort No wheeze No rhonchi No rales Cardiovascular: S1/S2 normal RRR No rub/gallop auscultated No lower extremity edema Gastrointestinal: No tenderness Neurological: No cranial nerve deficit on limited exam Alert Psychiatric: Fair judgment/insight Flat mood and affect       Scheduled Medications:   feeding supplement  237 mL Oral TID BM   heparin  5,000 Units Subcutaneous Q8H   latanoprost  1 drop Both Eyes QHS   multivitamin with minerals  1 tablet Oral Daily   pantoprazole  40 mg Oral Daily   rosuvastatin  5 mg Oral Once per day on Mon Thu   tamsulosin  0.4 mg Oral Daily    Continuous Infusions:   PRN Medications:  acetaminophen **OR** acetaminophen, hydrALAZINE, melatonin, ondansetron **OR** ondansetron (ZOFRAN) IV, senna-docusate  Antimicrobials:  Anti-infectives (From admission, onward)    None           Data Reviewed: I have personally reviewed following labs and imaging studies  CBC: Recent Labs  Lab 01/17/22 1001 01/18/22 0357 01/19/22 0442  WBC 16.0* 14.0* 10.2  HGB 11.0* 11.0* 10.2*  HCT 33.4* 32.9* 30.8*  MCV 84.3 84.6 84.6  PLT 377 366 240   Basic Metabolic Panel: Recent Labs  Lab 01/17/22 1001 01/18/22 0357 01/19/22 0442  NA 135 138 142  K 4.7 3.7 3.3*  CL 104 108 115*  CO2 17* 21* 23  GLUCOSE 129* 97 133*  BUN 148* 109* 66*  CREATININE 10.83* 5.74* 1.70*  CALCIUM 9.2 9.2 9.2  MG 2.8*  --   --    GFR: Estimated Creatinine Clearance: 27 mL/min (A) (by C-G formula based on SCr of 1.7 mg/dL (H)). Liver Function Tests: No results for input(s):  "AST", "ALT", "ALKPHOS", "BILITOT", "PROT", "ALBUMIN" in the last 168 hours. No results for input(s): "LIPASE", "AMYLASE" in the last 168 hours. No results for input(s): "AMMONIA" in the last 168 hours. Coagulation Profile: No results for input(s): "INR", "PROTIME" in the last 168 hours. Cardiac Enzymes: No results for input(s): "CKTOTAL", "CKMB", "CKMBINDEX", "TROPONINI" in the last 168 hours. BNP (last 3 results) No results for input(s): "PROBNP" in the last 8760 hours. HbA1C: No results for input(s): "HGBA1C" in the last 72 hours. CBG: No results for input(s): "GLUCAP" in the last 168 hours. Lipid Profile: No results for input(s): "CHOL", "HDL", "LDLCALC", "TRIG", "CHOLHDL", "LDLDIRECT" in the last 72 hours. Thyroid Function Tests: No results for input(s): "TSH", "T4TOTAL", "FREET4", "T3FREE", "THYROIDAB" in the last 72 hours. Anemia Panel: No results for input(s): "VITAMINB12", "FOLATE", "FERRITIN", "TIBC", "IRON", "RETICCTPCT" in the last 72 hours. Most Recent Urinalysis On File:     Component Value Date/Time   COLORURINE YELLOW (A) 01/17/2022 1648   APPEARANCEUR HAZY (A) 01/17/2022 1648   APPEARANCEUR Clear 12/18/2017 1128   LABSPEC 1.016 01/17/2022  [place] 5.0 01/17/2022 1648   GLUCOSEU NEGATIVE 01/17/2022 1648   HGBUR MODERATE (A) 01/17/2022 1648   BILIRUBINUR NEGATIVE 01/17/2022 1648   BILIRUBINUR Negative 12/18/2017 [place] 01/17/2022 1648   PROTEINUR NEGATIVE 01/17/2022 1648   NITRITE NEGATIVE 01/17/2022 1648   LEUKOCYTESUR LARGE (A) 01/17/2022 1648   Sepsis Labs: '@LABRCNTIP'$ (procalcitonin:4,lacticidven:4)  Recent Results (from the past 240 hour(s))  Resp panel by RT-PCR (RSV, Flu A&B, Covid) Anterior Nasal Swab     Status: None   Collection Time: 01/17/22 12:28 PM   Specimen: Anterior Nasal Swab  Result Value Ref Range Status   SARS Coronavirus 2 by RT PCR NEGATIVE NEGATIVE Final    Comment: (NOTE) SARS-CoV-2 target nucleic acids are  NOT DETECTED.  The SARS-CoV-2 RNA is generally detectable in upper respiratory specimens during the acute phase of infection. The lowest concentration of SARS-CoV-2 viral copies this assay can detect is 138 copies/mL. A negative result does not preclude SARS-Cov-2 infection and should not be used as the sole basis for treatment or other patient management decisions. A negative result may occur with  improper specimen collection/handling, submission of specimen other than nasopharyngeal swab, presence of viral mutation(s) within the areas targeted by this assay, and inadequate number of viral copies(<138 copies/mL). A negative result must be combined with clinical observations, patient history, and epidemiological information. The expected result is Negative.  Fact Sheet for Patients:  EntrepreneurPulse.com.au  Fact Sheet for Healthcare Providers:  IncredibleEmployment.be  This test is no t yet approved or cleared by the [place] FDA and  has been authorized for detection and/or diagnosis of SARS-CoV-2 by FDA under an Emergency Use Authorization (EUA). This EUA will remain  in effect (meaning this test can be used) for the duration of the COVID-19 declaration under Section 564(b)(1) of the Act, 21 U.S.C.section 360bbb-3(b)(1), unless the authorization is terminated  or revoked sooner.       Influenza A by PCR NEGATIVE NEGATIVE Final   Influenza B by PCR NEGATIVE NEGATIVE Final    Comment: (NOTE) The Xpert Xpress SARS-CoV-2/FLU/RSV plus assay is intended as an aid in the diagnosis of influenza from Nasopharyngeal swab specimens and should not be used as a sole basis for treatment. Nasal washings and aspirates are unacceptable for Xpert Xpress SARS-CoV-2/FLU/RSV testing.  Fact Sheet for Patients: EntrepreneurPulse.com.au  Fact Sheet for Healthcare Providers: IncredibleEmployment.be  This test is not  yet approved or cleared by the [place] FDA and has been authorized for detection and/or diagnosis of SARS-CoV-2 by FDA under an Emergency Use Authorization (EUA). This EUA will remain in effect (meaning this test can be used) for the duration of the COVID-19 declaration under Section 564(b)(1) of the Act, 21 U.S.C. section 360bbb-3(b)(1), unless the authorization is terminated or revoked.     Resp Syncytial Virus by PCR NEGATIVE NEGATIVE Final    Comment: (NOTE) Fact Sheet for Patients: EntrepreneurPulse.com.au  Fact Sheet for Healthcare Providers: IncredibleEmployment.be  This test is not yet approved or cleared by the [place] FDA and has been authorized for detection and/or diagnosis of SARS-CoV-2 by FDA under an Emergency Use Authorization (EUA). This EUA will remain in effect (meaning this test can be used) for the duration of the COVID-19 declaration under Section 564(b)(1) of the Act, 21 U.S.C. section 360bbb-3(b)(1), unless the authorization is terminated or revoked.  Performed at [facility], [address] [place], [place] [address]          Radiology Studies: [place]  Chest Portable 1 View  Result Date: 01/17/2022 CLINICAL DATA:  Weakness and difficulty urinating. EXAM: PORTABLE CHEST 1 VIEW COMPARISON:  11/13/2017. FINDINGS: Patient is slightly rotated. Trachea is midline. Heart is enlarged, stable. Mild streaky bibasilar atelectasis. No airspace consolidation or pleural fluid. Large hiatal hernia. Old proximal left humerus fracture. IMPRESSION: 1. Minimal streaky bibasilar atelectasis. 2. Large hiatal hernia. Electronically Signed   By: Lorin Picket M.D.   On: 01/17/2022 12:00            LOS: 2 days      Emeterio Reeve, DO [facility] 01/19/2022, 4:53 PM    Dictation software may have been used to generate the above note. Typos may occur and escape review in typed/dictated  notes. Please contact Dr Sheppard Coil directly for clarity if needed.  Staff may message me via secure chat in [address]  but this may not receive an immediate response,  please page me for urgent matters!  If 7PM-7AM, please contact night coverage [email or web address]

## 2022-01-19 NOTE — Evaluation (Addendum)
Physical Therapy Evaluation Patient Details Name: Scott Ashley MRN: [id number] DOB: 09-Jul-1929 Today's Date: 01/19/2022  History of Present Illness  Scott Ashley is a 41yoM who comes to [facility] after progressive weakness over the past 2 weeks culminating in failuted UTI treatment and difficulty utinating. Pt required foley placement by uroogy 2/2 obstruction and bladder scan >999. PTA pt lives alone in a single level farm house, is legally blind (does not drive anymore), HOH, has support from family and framily nearby.  Clinical Impression  Pt admitted with above Dx. Pt has functional limitations due to deficits below (see "PT Problem List"). Family friend Misty able to provide details on baseline functional status, home setup. Today pt requires considerable physical effort to perform bed mobility, minA for transfer to standing, and short distance walking to doorway and back is not able to be performed despite attempt. Patient's performance this date reveals decreased ability, independence, and tolerance in performing all basic mobility required for performance of activities of daily living. Given pt living alone and only tolerating <52f AMB, he would be well poised to receive high frequency rehab services in a rehab setting at [place] prior to eventual return to home. Pt requires additional DME, close physical assistance, and cues for safe participate in mobility. Pt will benefit from skilled PT intervention to increase independence and safety with basic mobility in preparation for discharge to the venue listed below.     No data found.      Recommendations for follow up therapy are one component of a multi-disciplinary discharge planning process, led by the attending physician.  Recommendations may be updated based on patient status, additional functional criteria and insurance authorization.  Follow Up Recommendations Skilled nursing-short term rehab (<3 hours/day) Can patient physically be  transported by private vehicle: No    Assistance Recommended at Discharge Intermittent Supervision/Assistance  Patient can return home with the following  A little help with walking and/or transfers;A little help with bathing/dressing/bathroom;Assistance with cooking/housework;Help with stairs or ramp for entrance;Assist for transportation;Direct supervision/assist for medications management    Equipment Recommendations None recommended by PT  Recommendations for Other Services       Functional Status Assessment Patient has had a recent decline in their functional status and demonstrates the ability to make significant improvements in function in a reasonable and predictable amount of time.     Precautions / Restrictions Precautions Precautions: Fall Restrictions Weight Bearing Restrictions: No      Mobility  Bed Mobility Overal bed mobility: Needs Assistance Bed Mobility: Supine to Sit     Supine to sit: Supervision     General bed mobility comments: to EOB right, aChief Strategy Officermaintains security of foley    Transfers Overall transfer level: Needs assistance Equipment used: Rolling walker (2 wheels) Transfers: Sit to/from Stand Sit to Stand: Min assist           General transfer comment: performs with minguard assist from EOB elevated    Ambulation/Gait Ambulation/Gait assistance: Min guard Gait Distance (Feet): 14 Feet Assistive device: Rolling walker (2 wheels) Gait Pattern/deviations: Step-to pattern       General Gait Details: pt unable to go farther, appears weak, takes severa pauses enroute back to chair, but cannot provide detail  Stairs            Wheelchair Mobility    Modified Rankin (Stroke Patients Only)       Balance  Pertinent Vitals/Pain Pain Assessment Pain Assessment: No/denies pain    Home Living Family/patient expects to be discharged to:: Private  residence Living Arrangements: Alone Available Help at Discharge: Family;Friend(s) Type of Home: House Home Access: Stairs to enter   [employer] of Steps: 2   Home Layout: One level Home Equipment: None Additional Comments: has access to a gator and 4WW    Prior Function Prior Level of Function : Independent/Modified Independent             Mobility Comments: typically hosuehold AMB withuot device, able to AMB property /fram withotu device up until 2 weeks prior ADLs Comments: mod I     Hand Dominance        Extremity/Trunk Assessment   Upper Extremity Assessment Upper Extremity Assessment: Generalized weakness    Lower Extremity Assessment Lower Extremity Assessment: Generalized weakness       Communication      Cognition Arousal/Alertness: Lethargic   Overall Cognitive Status: Difficult to assess                                          General Comments      Exercises     Assessment/Plan    PT Assessment Patient needs continued PT services  PT Problem List Decreased strength;Decreased activity tolerance;Decreased mobility       PT Treatment Interventions DME instruction;Gait training;Stair training;Functional mobility training;Therapeutic activities;Therapeutic exercise;Balance training;Cognitive remediation;Patient/family education    PT Goals (Current goals can be found in the Care Plan section)  Acute Rehab PT Goals Patient Stated Goal: regain strenght for return to home with support PT Goal Formulation: With patient/family Time For Goal Achievement: 02/02/22 Potential to Achieve Goals: Good    Frequency Min 2X/week     Co-evaluation               AM-PAC PT "6 Clicks" Mobility  Outcome Measure Help needed turning from your back to your side while in a flat bed without using bedrails?: A Little Help needed moving from lying on your back to sitting on the side of a flat bed without using bedrails?: A  Little Help needed moving to and from a bed to a chair (including a wheelchair)?: A Lot Help needed standing up from a chair using your arms (e.g., wheelchair or bedside chair)?: A Lot Help needed to walk in hospital room?: A Lot Help needed climbing 3-5 steps with a railing? : A Lot 6 Click Score: 14    End of Session Equipment Utilized During Treatment: Gait belt Activity Tolerance: Patient limited by fatigue Patient left: in chair;with family/visitor present;with chair alarm set;with call bell/phone within reach Nurse Communication: Mobility status PT Visit Diagnosis: Difficulty in walking, not elsewhere classified (R26.2);Other abnormalities of gait and mobility (R26.89)    Time: 1610-9604 PT Time Calculation (min) (ACUTE ONLY): 30 min   Charges:   PT Evaluation $PT Eval Moderate Complexity: 1 Mod         12:22 PM, 01/19/22 [name], PT, DPT Physical Therapist - [facility]  [phone] ([place])    [place] C 01/19/2022, 12:20 PM

## 2022-01-19 NOTE — Evaluation (Signed)
Occupational Therapy Evaluation Patient Details Name: Scott Ashley MRN: [id number] DOB: 10-01-1929 Today's Date: 01/19/2022   History of Present Illness Scott Ashley is a 57yoM who comes to [facility] after progressive weakness over the past 2 weeks culminating in failuted UTI treatment and difficulty utinating. Pt required foley placement by uroogy 2/2 obstruction and bladder scan >999. PTA pt lives alone in a single level farm house, is legally blind (does not drive anymore), HOH, has support from family and framily nearby.   Clinical Impression   Patient presenting with decreased in Ind in self care, balance, functional mobility/transfers, endurance, and safety awareness. Patient reports living at home alone on farm at [facility] [address]. Family is nearby that can assist intermittently. Pt is laying in recliner chair and reports fatigue. OT providing min A for pt to stand from recliner chair and transfer to bed. Min A for B LEs to return to supine. Pt's eyes closed as soon as he lays in bed with just an extreme amount of fatigue. Patient will benefit from acute OT to increase overall independence in the areas of ADLs, functional mobility, and safety awareness in order to safely discharge to next venue of care.      Recommendations for follow up therapy are one component of a multi-disciplinary discharge planning process, led by the attending physician.  Recommendations may be updated based on patient status, additional functional criteria and insurance authorization.   Follow Up Recommendations  Skilled nursing-short term rehab (<3 hours/day)     Assistance Recommended at Discharge Intermittent Supervision/Assistance  Patient can return home with the following A lot of help with walking and/or transfers;A lot of help with bathing/dressing/bathroom;Help with stairs or ramp for entrance;Assist for transportation    Functional Status Assessment  Patient has had a recent decline in their functional status  and demonstrates the ability to make significant improvements in function in a reasonable and predictable amount of time.  Equipment Recommendations  Other (comment) (defer to next venue of care)       Precautions / Restrictions Precautions Precautions: Fall Restrictions Weight Bearing Restrictions: No      Mobility Bed Mobility Overal bed mobility: Needs Assistance Bed Mobility: Sit to Supine, Rolling Rolling: Supervision     Sit to supine: Min assist   General bed mobility comments: min A for B LEs to return to supine    Transfers Overall transfer level: Needs assistance Equipment used: Rolling walker (2 wheels) Transfers: Sit to/from Stand Sit to Stand: Min assist                  Balance Overall balance assessment: Needs assistance Sitting-balance support: Feet supported Sitting balance-Leahy Scale: Fair     Standing balance support: Reliant on assistive device for balance, During functional activity, Bilateral upper extremity supported Standing balance-Leahy Scale: Poor                             ADL either performed or assessed with clinical judgement   ADL Overall ADL's : Needs assistance/impaired     Grooming: Wash/dry hands;Wash/dry face;Sitting;Set up;Supervision/safety               Lower Body Dressing: Moderate assistance   Toilet Transfer: Stand-pivot;Rolling walker (2 wheels);Minimal assistance Toilet Transfer Details (indicate cue type and reason): simulated                 Vision Patient Visual Report: No change from baseline  Pertinent Vitals/Pain Pain Assessment Pain Assessment: No/denies pain     Hand Dominance Right   Extremity/Trunk Assessment Upper Extremity Assessment Upper Extremity Assessment: Generalized weakness   Lower Extremity Assessment Lower Extremity Assessment: Generalized weakness       Communication Communication Communication: No difficulties   Cognition  Arousal/Alertness: Lethargic   Overall Cognitive Status: Difficult to assess                                 General Comments: Pt is very fatigued with activity                Home Living Family/patient expects to be discharged to:: Private residence Living Arrangements: Alone Available Help at Discharge: Family;Friend(s) Type of Home: House Home Access: Stairs to enter [employer] of Steps: 2   Home Layout: One level     Bathroom Shower/Tub: Sponge bathes at baseline         Home Equipment: None   Additional Comments: has access to a gator and 4WW      Prior Functioning/Environment Prior Level of Function : Independent/Modified Independent             Mobility Comments: typically hosuehold AMB withuot device, able to AMB property /farm without device up until 2 weeks prior ADLs Comments: mod I        OT Problem List: Decreased strength;Decreased activity tolerance;Impaired balance (sitting and/or standing);Decreased safety awareness      OT Treatment/Interventions: Self-care/ADL training;Therapeutic exercise;Therapeutic activities;Energy conservation;DME and/or AE instruction;Patient/family education;Manual therapy;Balance training    OT Goals(Current goals can be found in the care plan section) Acute Rehab OT Goals Patient Stated Goal: to get stronger OT Goal Formulation: With patient Time For Goal Achievement: 02/02/22 Potential to Achieve Goals: Good ADL Goals Pt Will Perform Grooming: with supervision;standing Pt Will Perform Lower Body Dressing: with supervision;sit to/from stand Pt Will Transfer to Toilet: with supervision;ambulating Pt Will Perform Toileting - Clothing Manipulation and hygiene: with supervision;sit to/from stand  OT Frequency: Min 2X/week       AM-PAC OT "6 Clicks" Daily Activity     Outcome Measure Help from another person eating meals?: None Help from another person taking care of personal  grooming?: A Little Help from another person toileting, which includes using toliet, bedpan, or urinal?: A Lot Help from another person bathing (including washing, rinsing, drying)?: A Lot Help from another person to put on and taking off regular upper body clothing?: A Little Help from another person to put on and taking off regular lower body clothing?: A Lot 6 Click Score: 16   End of Session Equipment Utilized During Treatment: Rolling walker (2 wheels) Nurse Communication: Mobility status  Activity Tolerance: Patient limited by fatigue Patient left: in bed;with call bell/phone within reach;with bed alarm set  OT Visit Diagnosis: Unsteadiness on feet (R26.81);Muscle weakness (generalized) (M62.81)                Time: 9381-0175 OT Time Calculation (min): 14 min Charges:  OT General Charges $OT Visit: 1 Visit OT Evaluation $OT Eval Moderate Complexity: 1 Mod OT Treatments $Therapeutic Activity: 8-22 mins  Darleen Crocker, MS, OTR/L , CBIS ascom [phone]  01/19/22, 2:08 PM

## 2022-01-20 ENCOUNTER — Telehealth: Payer: Self-pay

## 2022-01-20 DIAGNOSIS — N179 Acute kidney failure, unspecified: Secondary | ICD-10-CM | POA: Diagnosis not present

## 2022-01-20 LAB — BASIC METABOLIC PANEL
Anion gap: 6 (ref 5–15)
BUN: 43 mg/dL — ABNORMAL HIGH (ref 8–23)
CO2: 25 mmol/L (ref 22–32)
Calcium: 8.8 mg/dL — ABNORMAL LOW (ref 8.9–10.3)
Chloride: 111 mmol/L (ref 98–111)
Creatinine, Ser: 1.02 mg/dL (ref 0.61–1.24)
GFR, Estimated: >60 mL/min (ref >60)
Glucose, Bld: 103 mg/dL — ABNORMAL HIGH (ref 70–99)
Potassium: 3.5 mmol/L (ref 3.5–5.1)
Sodium: 142 mmol/L (ref 135–145)

## 2022-01-20 LAB — CBC
HCT: 28.5 % — ABNORMAL LOW (ref 39.0–52.0)
Hemoglobin: 9.4 g/dL — ABNORMAL LOW (ref 13.0–17.0)
MCH: 28.3 pg (ref 26.0–34.0)
MCHC: 33 g/dL (ref 30.0–36.0)
MCV: 85.8 fL (ref 80.0–100.0)
Platelets: 292 10*3/uL (ref 150–400)
RBC: 3.32 MIL/uL — ABNORMAL LOW (ref 4.22–5.81)
RDW: 14.7 % (ref 11.5–15.5)
WBC: 9.3 10*3/uL (ref 4.0–10.5)
nRBC: 0 % (ref 0.0–0.2)

## 2022-01-20 MED ORDER — MELATONIN 5 MG PO TABS: 5.0000 mg | ORAL_TABLET | Freq: Every evening | ORAL | 0 refills | Status: DC | PRN

## 2022-01-20 MED ORDER — ADULT MULTIVITAMIN W/MINERALS CH: 1.0000 | ORAL_TABLET | Freq: Every day | ORAL | Status: AC

## 2022-01-20 NOTE — Discharge Summary (Signed)
Physician Discharge Summary   Patient: Scott Ashley MRN: [id number]  DOB: 1929/02/13   Admit:     Date of Admission: 01/17/2022 Admitted from: home   Discharge: Date of discharge: 01/20/22 Disposition: Home health Condition at discharge: fair  CODE STATUS: DNR     Discharge Physician: Emeterio Reeve, DO [facility]     PCP: Olin Hauser, DO  Recommendations for Outpatient Follow-up:  Follow up with PCP Olin Hauser, DO in 1-2 weeks Ensure follow up w/ urology in 1-2 weeks  Please obtain labs/tests: BMP, CBC in 1-2 weeks, consider UA Please follow up on the following pending results: none   Discharge Instructions     Diet - low sodium heart healthy   Complete by: As directed    Increase activity slowly   Complete by: As directed          Discharge Diagnoses: Principal Problem:   AKI (acute kidney injury) ([place]) Active Problems:   Acute on chronic urinary retention   BPH with obstruction/lower urinary tract symptoms   Leukocytosis   Essential hypertension   Glaucoma (increased eye pressure)   Insomnia   Pure hypercholesterolemia   Protein-calorie malnutrition, severe       Hospital Course: 88 year old male with history of hypertension, BPH, & recent UTI who was prescribed Keflex on 01/06/2022 and presented to the emergency department for chief concerns of generalized weakness and difficulty urinating for the past week.  Workup revealed acute kidney injury with creatinine of 10.7 (normal baseline) secondary to urinary retention.  Nursing unable to place Foley or coud catheter and urology consulted and placed one 01/17/22. Creatinine slowly improving over next few days down to 1.7 w/ GFR 38 today 01/19/22, WBC also normalized today. Baseline Cr 1.0-1.3. PT/OT recs for SNF, family deciding on this vs HH, TOC following. D/C IVF, continued improvement in renal function and medical stability for d/c today 12/14 - TOC to  speak w/ family re: [facility] vs SNF and arrange - patient is medically clear for discharge     Consultants:  Urology  Procedures: Placement of Foley catheter 12/11       ASSESSMENT & PLAN:   Principal Problem:   AKI (acute kidney injury) ([place]) Active Problems:   Acute on chronic urinary retention   BPH with obstruction/lower urinary tract symptoms   Leukocytosis   Essential hypertension   Glaucoma (increased eye pressure)   Insomnia   Pure hypercholesterolemia   Protein-calorie malnutrition, severe   AKI (acute kidney injury) ([place]) - resolved  Secondary to urinary retention.   Status post Foley catheter placement  Trending Cr down nicely Plan is for patient to keep Foley catheter and follow-up with urology in 1 week on Flomax.   Acute on chronic urinary retention BPH with obstruction/lower urinary tract symptoms Patient takes tamsulosin 0.4 mg daily resumed for 01/18/22 Status post Foley catheter placement.   no evidence of urinary tract infection now   Leukocytosis - resolved Secondary to stress margination.  Given normal procalcitonin and no evidence of strong bacteria or nitrites on urinalysis, do not think that he currently has urinary tract infection.   Essential hypertension Holding home bisoprolol-hydrochlorothiazide 5-6.25 mg p.o. daily due to acute kidney injury and mildly low blood pressure on admission   Glaucoma (increased eye pressure) Continue home eyedrops   Insomnia Patient takes Belsomra nightly as needed for sleep, this formulary is not available inpatient therefore has not been resumed on admission Melatonin 5 mg nightly as  needed for sleep, 5 days ordered   Protein-calorie malnutrition, severe Nutrition Status: Nutrition Problem: Severe Malnutrition Etiology: social / environmental circumstances Signs/Symptoms: severe muscle depletion, severe fat depletion Interventions: Ensure Enlive (each supplement provides 350kcal and 20 grams of protein),  MVI, Liberalize Diet   Pure hypercholesterolemia Patient takes rosuvastatin 5 mg by mouth 2 times per week                Discharge Instructions  Allergies as of 01/20/2022       Reactions   Codeine Other (See Comments)        Medication List     STOP taking these medications    Belsomra 20 MG Tabs Generic drug: Suvorexant   bisoprolol-hydrochlorothiazide 5-6.25 MG tablet Commonly known as: ZIAC   cephALEXin 500 MG capsule Commonly known as: KEFLEX   fluticasone 50 MCG/ACT nasal spray Commonly known as: FLONASE   ketoconazole 2 % cream Commonly known as: NIZORAL       TAKE these medications    acetaminophen 500 MG tablet Commonly known as: TYLENOL Take 500 mg by mouth every 6 (six) hours as needed.   latanoprost 0.005 % ophthalmic solution Commonly known as: XALATAN Place 1 drop into both eyes at bedtime.   melatonin 5 MG Tabs Take 1 tablet (5 mg total) by mouth at bedtime as needed.   multivitamin with minerals Tabs tablet Take 1 tablet by mouth daily. Start taking on: January 21, 2022   omeprazole 20 MG capsule Commonly known as: PRILOSEC TAKE 1 CAPSULE BY MOUTH DAILY   PROSTATE PO Take by mouth.   rosuvastatin 5 MG tablet Commonly known as: CRESTOR Take 1 tablet (5 mg total) by mouth 2 (two) times a week.   tamsulosin 0.4 MG Caps capsule Commonly known as: FLOMAX TAKE 1 CAPSULE BY MOUTH DAILY          Allergies  Allergen Reactions   Codeine Other (See Comments)     Subjective: pt has no complaints. Granddaughter is at bedside on rounds, no concerns, she lives next door to him and feels he will be capable for self-care w/ assistance    Discharge Exam: BP 139/76 (BP Location: Right Arm)   Pulse 89   Temp 98.3 F (36.8 C)   Resp 15   Ht '5\' 7"'$  (1.702 m)   Wt 68.9 kg   SpO2 98%   BMI 23.79 kg/m  General: Pt is alert, awake, not in acute distress, frail Cardiovascular: RRR, S1/S2 +, no rubs, no  gallops Respiratory: CTA bilaterally, no wheezing, no rhonchi Abdominal: Soft, NT, ND, bowel sounds + Extremities: no edema, no cyanosis     The results of significant diagnostics from this hospitalization (including imaging, microbiology, ancillary and laboratory) are listed below for reference.     Microbiology: Recent Results (from the past 240 hour(s))  Resp panel by RT-PCR (RSV, Flu A&B, Covid) Anterior Nasal Swab     Status: None   Collection Time: 01/17/22 12:28 PM   Specimen: Anterior Nasal Swab  Result Value Ref Range Status   SARS Coronavirus 2 by RT PCR NEGATIVE NEGATIVE Final    Comment: (NOTE) SARS-CoV-2 target nucleic acids are NOT DETECTED.  The SARS-CoV-2 RNA is generally detectable in upper respiratory specimens during the acute phase of infection. The lowest concentration of SARS-CoV-2 viral copies this assay can detect is 138 copies/mL. A negative result does not preclude SARS-Cov-2 infection and should not be used as the sole basis for treatment or other patient management  decisions. A negative result may occur with  improper specimen collection/handling, submission of specimen other than nasopharyngeal swab, presence of viral mutation(s) within the areas targeted by this assay, and inadequate number of viral copies(<138 copies/mL). A negative result must be combined with clinical observations, patient history, and epidemiological information. The expected result is Negative.  Fact Sheet for Patients:  EntrepreneurPulse.com.au  Fact Sheet for Healthcare Providers:  IncredibleEmployment.be  This test is no t yet approved or cleared by the [place] FDA and  has been authorized for detection and/or diagnosis of SARS-CoV-2 by FDA under an Emergency Use Authorization (EUA). This EUA will remain  in effect (meaning this test can be used) for the duration of the COVID-19 declaration under Section 564(b)(1) of the Act,  21 U.S.C.section 360bbb-3(b)(1), unless the authorization is terminated  or revoked sooner.       Influenza A by PCR NEGATIVE NEGATIVE Final   Influenza B by PCR NEGATIVE NEGATIVE Final    Comment: (NOTE) The Xpert Xpress SARS-CoV-2/FLU/RSV plus assay is intended as an aid in the diagnosis of influenza from Nasopharyngeal swab specimens and should not be used as a sole basis for treatment. Nasal washings and aspirates are unacceptable for Xpert Xpress SARS-CoV-2/FLU/RSV testing.  Fact Sheet for Patients: EntrepreneurPulse.com.au  Fact Sheet for Healthcare Providers: IncredibleEmployment.be  This test is not yet approved or cleared by the [place] FDA and has been authorized for detection and/or diagnosis of SARS-CoV-2 by FDA under an Emergency Use Authorization (EUA). This EUA will remain in effect (meaning this test can be used) for the duration of the COVID-19 declaration under Section 564(b)(1) of the Act, 21 U.S.C. section 360bbb-3(b)(1), unless the authorization is terminated or revoked.     Resp Syncytial Virus by PCR NEGATIVE NEGATIVE Final    Comment: (NOTE) Fact Sheet for Patients: EntrepreneurPulse.com.au  Fact Sheet for Healthcare Providers: IncredibleEmployment.be  This test is not yet approved or cleared by the [place] FDA and has been authorized for detection and/or diagnosis of SARS-CoV-2 by FDA under an Emergency Use Authorization (EUA). This EUA will remain in effect (meaning this test can be used) for the duration of the COVID-19 declaration under Section 564(b)(1) of the Act, 21 U.S.C. section 360bbb-3(b)(1), unless the authorization is terminated or revoked.  Performed at [facility], [address] [place], [place] [address]      Labs: BNP (last 3 results) No results for input(s): "BNP" in the last 8760 hours. Basic Metabolic Panel: Recent Labs  Lab  01/17/22 1001 01/18/22 0357 01/19/22 0442 01/20/22 0319  NA 135 138 142 142  K 4.7 3.7 3.3* 3.5  CL 104 108 115* 111  CO2 17* 21* 23 25  GLUCOSE 129* 97 133* 103*  BUN 148* 109* 66* 43*  CREATININE 10.83* 5.74* 1.70* 1.02  CALCIUM 9.2 9.2 9.2 8.8*  MG 2.8*  --   --   --    Liver Function Tests: No results for input(s): "AST", "ALT", "ALKPHOS", "BILITOT", "PROT", "ALBUMIN" in the last 168 hours. No results for input(s): "LIPASE", "AMYLASE" in the last 168 hours. No results for input(s): "AMMONIA" in the last 168 hours. CBC: Recent Labs  Lab 01/17/22 1001 01/18/22 0357 01/19/22 0442 01/20/22 0319  WBC 16.0* 14.0* 10.2 9.3  HGB 11.0* 11.0* 10.2* 9.4*  HCT 33.4* 32.9* 30.8* 28.5*  MCV 84.3 84.6 84.6 85.8  PLT 377 366 325 292   Cardiac Enzymes: No results for input(s): "CKTOTAL", "CKMB", "CKMBINDEX", "TROPONINI" in the last 168 hours.  BNP: Invalid input(s): "POCBNP" CBG: No results for input(s): "GLUCAP" in the last 168 hours. D-Dimer No results for input(s): "DDIMER" in the last 72 hours. Hgb A1c No results for input(s): "HGBA1C" in the last 72 hours. Lipid Profile No results for input(s): "CHOL", "HDL", "LDLCALC", "TRIG", "CHOLHDL", "LDLDIRECT" in the last 72 hours. Thyroid function studies No results for input(s): "TSH", "T4TOTAL", "T3FREE", "THYROIDAB" in the last 72 hours.  Invalid input(s): "FREET3" Anemia work up No results for input(s): "VITAMINB12", "FOLATE", "FERRITIN", "TIBC", "IRON", "RETICCTPCT" in the last 72 hours. Urinalysis    Component Value Date/Time   COLORURINE YELLOW (A) 01/17/2022 1648   APPEARANCEUR HAZY (A) 01/17/2022 1648   APPEARANCEUR Clear 12/18/2017 1128   LABSPEC 1.016 01/17/2022 1648   PHURINE 5.0 01/17/2022 1648   GLUCOSEU NEGATIVE 01/17/2022 1648   HGBUR MODERATE (A) 01/17/2022 1648   BILIRUBINUR NEGATIVE 01/17/2022 1648   BILIRUBINUR Negative 12/18/2017 1128   KETONESUR NEGATIVE 01/17/2022 1648   PROTEINUR NEGATIVE  01/17/2022 1648   NITRITE NEGATIVE 01/17/2022 1648   LEUKOCYTESUR LARGE (A) 01/17/2022 1648   Sepsis Labs Recent Labs  Lab 01/17/22 1001 01/18/22 0357 01/19/22 0442 01/20/22 0319  WBC 16.0* 14.0* 10.2 9.3   Microbiology Recent Results (from the past 240 hour(s))  Resp panel by RT-PCR (RSV, Flu A&B, Covid) Anterior Nasal Swab     Status: None   Collection Time: 01/17/22 12:28 PM   Specimen: Anterior Nasal Swab  Result Value Ref Range Status   SARS Coronavirus 2 by RT PCR NEGATIVE NEGATIVE Final    Comment: (NOTE) SARS-CoV-2 target nucleic acids are NOT DETECTED.  The SARS-CoV-2 RNA is generally detectable in upper respiratory specimens during the acute phase of infection. The lowest concentration of SARS-CoV-2 viral copies this assay can detect is 138 copies/mL. A negative result does not preclude SARS-Cov-2 infection and should not be used as the sole basis for treatment or other patient management decisions. A negative result may occur with  improper specimen collection/handling, submission of specimen other than nasopharyngeal swab, presence of viral mutation(s) within the areas targeted by this assay, and inadequate number of viral copies(<138 copies/mL). A negative result must be combined with clinical observations, patient history, and epidemiological information. The expected result is Negative.  Fact Sheet for Patients:  EntrepreneurPulse.com.au  Fact Sheet for Healthcare Providers:  IncredibleEmployment.be  This test is no t yet approved or cleared by the [place] FDA and  has been authorized for detection and/or diagnosis of SARS-CoV-2 by FDA under an Emergency Use Authorization (EUA). This EUA will remain  in effect (meaning this test can be used) for the duration of the COVID-19 declaration under Section 564(b)(1) of the Act, 21 U.S.C.section 360bbb-3(b)(1), unless the authorization is terminated  or revoked sooner.        Influenza A by PCR NEGATIVE NEGATIVE Final   Influenza B by PCR NEGATIVE NEGATIVE Final    Comment: (NOTE) The Xpert Xpress SARS-CoV-2/FLU/RSV plus assay is intended as an aid in the diagnosis of influenza from Nasopharyngeal swab specimens and should not be used as a sole basis for treatment. Nasal washings and aspirates are unacceptable for Xpert Xpress SARS-CoV-2/FLU/RSV testing.  Fact Sheet for Patients: EntrepreneurPulse.com.au  Fact Sheet for Healthcare Providers: IncredibleEmployment.be  This test is not yet approved or cleared by the [place] FDA and has been authorized for detection and/or diagnosis of SARS-CoV-2 by FDA under an Emergency Use Authorization (EUA). This EUA will remain in effect (meaning this test can be used) for the  duration of the COVID-19 declaration under Section 564(b)(1) of the Act, 21 U.S.C. section 360bbb-3(b)(1), unless the authorization is terminated or revoked.     Resp Syncytial Virus by PCR NEGATIVE NEGATIVE Final    Comment: (NOTE) Fact Sheet for Patients: EntrepreneurPulse.com.au  Fact Sheet for Healthcare Providers: IncredibleEmployment.be  This test is not yet approved or cleared by the [place] FDA and has been authorized for detection and/or diagnosis of SARS-CoV-2 by FDA under an Emergency Use Authorization (EUA). This EUA will remain in effect (meaning this test can be used) for the duration of the COVID-19 declaration under Section 564(b)(1) of the Act, 21 U.S.C. section 360bbb-3(b)(1), unless the authorization is terminated or revoked.  Performed at [facility], [address], [place], [place] [address]    Imaging DG Chest Portable 1 View  Result Date: 01/17/2022 CLINICAL DATA:  Weakness and difficulty urinating. EXAM: PORTABLE CHEST 1 VIEW COMPARISON:  11/13/2017. FINDINGS: Patient is slightly rotated. Trachea is  midline. Heart is enlarged, stable. Mild streaky bibasilar atelectasis. No airspace consolidation or pleural fluid. Large hiatal hernia. Old proximal left humerus fracture. IMPRESSION: 1. Minimal streaky bibasilar atelectasis. 2. Large hiatal hernia. Electronically Signed   By: Lorin Picket M.D.   On: 01/17/2022 12:00      Time coordinating discharge: over 30 minutes  SIGNED:  Emeterio Reeve DO [facility]

## 2022-01-20 NOTE — TOC Progression Note (Signed)
Transition of Care [facility]) - Progression Note    Patient Details  Name: Scott Ashley MRN: [id number] Date of Birth: 08/17/1929  Transition of Care [facility]) CM/SW [place], [place] Phone Number: 01/20/2022, 10:21 AM  Clinical Narrative:     TOC spoke to the provider and the patient is now requesting home health instead of SNF. TOC to arrange [place] in his town.       Expected Discharge Plan and Services      HH                                           Social Determinants of Health (SDOH) Interventions    Readmission Risk Interventions     No data to display

## 2022-01-20 NOTE — Progress Notes (Addendum)
Physical Therapy Treatment Patient Details Name: Scott Ashley MRN: [id number] DOB: 08/07/1929 Today's Date: 01/20/2022   History of Present Illness Scott Ashley is a 9yoM who comes to [facility] after progressive weakness over the past 2 weeks culminating in failuted UTI treatment and difficulty utinating. Pt required foley placement by uroogy 2/2 obstruction and bladder scan >999. PTA pt lives alone in a single level farm house, is legally blind (does not drive anymore), HOH, has support from family and framily nearby.    PT Comments    Treatment deferred this morning as pt appears quite drowsy/sleepy. Discussed PT recs with GrandDTR and Misty at the bedside. Author returns to room later, pt more awake and ready for transfer to chair with RN bedside. Pt agreereable to attempt AMB prior to going to chair for lunch. Pt able to AMB into hallway and turns around 26f short of stairwell door. Pt reports to tolerate AMB well, returned to recliner after AMB and set up with his soup. All needs met. Discussed with RN and OT. Family has elected to take pt to home and provide care there. Will reach out to family to determine any additional DME needs.      Recommendations for follow up therapy are one component of a multi-disciplinary discharge planning process, led by the attending physician.  Recommendations may be updated based on patient status, additional functional criteria and insurance authorization.  Follow Up Recommendations  Skilled nursing-short term rehab (<3 hours/day) (family electing to take pt home and provide care at that venue) Can patient physically be transported by private vehicle: No   Assistance Recommended at Discharge Intermittent Supervision/Assistance  Patient can return home with the following A little help with walking and/or transfers;A little help with bathing/dressing/bathroom;Assistance with cooking/housework;Help with stairs or ramp for entrance;Assist for  transportation;Direct supervision/assist for medications management   Equipment Recommendations  BSC/3in1;Other (comment) (transport chair)    Recommendations for Other Services       Precautions / Restrictions Precautions Precautions: Fall Restrictions Weight Bearing Restrictions: No     Mobility  Bed Mobility               General bed mobility comments: at EOB with RN on entry, HOB elected    Transfers Overall transfer level: Needs assistance Equipment used: Rolling walker (2 wheels)   Sit to Stand: Min assist           General transfer comment: performed minGuardA with elevated EOB, heavy effort.    Ambulation/Gait   Gait Distance (Feet): 80 Feet Assistive device: Rolling walker (2 wheels) Gait Pattern/deviations: Step-to pattern Gait velocity: 0.176m     General Gait Details: Slow and steady, safe use of RW noted; reports to feel ok when asked. Requires 4 minutes to accompish this distance  (0.1057m.   Stairs             Wheelchair Mobility    Modified Rankin (Stroke Patients Only)       Balance                                            Cognition Arousal/Alertness: Awake/alert   Overall Cognitive Status: Difficult to assess  Exercises      General Comments        Pertinent Vitals/Pain Pain Assessment Pain Assessment: No/denies pain    Home Living                          Prior Function            PT Goals (current goals can now be found in the care plan section) Acute Rehab PT Goals Patient Stated Goal: regain strenght for return to home with support PT Goal Formulation: With patient/family Time For Goal Achievement: 02/02/22 Potential to Achieve Goals: Good Progress towards PT goals: Progressing toward goals    Frequency    Min 2X/week      PT Plan Current plan remains appropriate;Equipment recommendations need to  be updated    Co-evaluation              AM-PAC PT "6 Clicks" Mobility   Outcome Measure  Help needed turning from your back to your side while in a flat bed without using bedrails?: A Little Help needed moving from lying on your back to sitting on the side of a flat bed without using bedrails?: A Little Help needed moving to and from a bed to a chair (including a wheelchair)?: A Lot Help needed standing up from a chair using your arms (e.g., wheelchair or bedside chair)?: A Lot Help needed to walk in hospital room?: A Lot Help needed climbing 3-5 steps with a railing? : A Lot 6 Click Score: 14    End of Session Equipment Utilized During Treatment: Gait belt Activity Tolerance: Patient limited by fatigue;Patient tolerated treatment well Patient left: in chair;with family/visitor present;with chair alarm set;with call bell/phone within reach Nurse Communication: Mobility status PT Visit Diagnosis: Difficulty in walking, not elsewhere classified (R26.2);Other abnormalities of gait and mobility (R26.89)     Time: 9050-2561 PT Time Calculation (min) (ACUTE ONLY): 18 min  Charges:  $Therapeutic Exercise: 8-22 mins                    2:05 PM, 01/20/22 Scott Ashley, PT, DPT Physical Therapist - [facility]  [phone] (Scott Ashley)    Scott Ashley 01/20/2022, 2:05 PM

## 2022-01-20 NOTE — Care Management Important Message (Signed)
Important Message  Patient Details  Name: Scott Ashley MRN: [id number] Date of Birth: 02-27-1929   Medicare Important Message Given:  Yes     Dannette Barbara 01/20/2022, 2:37 PM

## 2022-01-20 NOTE — Progress Notes (Signed)
AVS printed and reviewed with patient and grand daughter. Verbalized understanding. Patient discharged home. All belongings gathered. Foley catheter left in place per urology. Switched to leg bag.

## 2022-01-20 NOTE — TOC CM/SW Note (Signed)
Progress Note for the Evaluation of Need for a Transport Chair Patient Name_[name]  DOB: 1929-04-27 Diagnosis: AKI Ht: 5'7" Weight: 151 lbs  Patient suffers from AKI, acute on chronic urinary retention, BPH, leukocytosis, glaucoma, hypercholesterolemia and is not able to use a cane or walker for ambulation.  Patient is unable to self-propel in a manual chair due to needing assistance with balance, vision and ADL and unable to operate a POV or power chair.  Patient does have a caregiver who is willing and able to operate the transport chair.   Clinician Signature/Credientials____________________________________________________ Clinician Printed Name_____________________________________ Date ________________

## 2022-01-20 NOTE — Progress Notes (Signed)
Occupational Therapy Treatment Patient Details Name: Scott Ashley MRN: [id number] DOB: [birth year] Today's Date: 01/20/2022   History of present illness Scott Ashley is a 29yoM who comes to [facility] after progressive weakness over the past 2 weeks culminating in failuted UTI treatment and difficulty utinating. Pt required foley placement by uroogy 2/2 obstruction and bladder scan >999. PTA pt lives alone in a single level farm house, is legally blind (does not drive anymore), HOH, has support from family and framily nearby.   OT comments  Mr. Heigl was seen for OT treatment on this date. Upon arrival to room pt awake/alert, seated in room recliner finishing lunch. Pt agreeable to OT tx session and engaged in functional activity as described below. Pt continues to be functionally limited by generalized weakness, decreased activity tolerance, and decreased balance. He requires MIN A for standing balance while performing standing grooming at sink. Pt educated on safety and falls preventio nt/o session. He is making good progress toward goals and continues to benefit from skilled OT services to maximize return to PLOF and minimize risk of future falls, injury, caregiver burden, and readmission. Will continue to follow POC. Discharge recommendation updated to [facility] OT.    Recommendations for follow up therapy are one component of a multi-disciplinary discharge planning process, led by the attending physician.  Recommendations may be updated based on patient status, additional functional criteria and insurance authorization.    Follow Up Recommendations  Home health OT     Assistance Recommended at Discharge Frequent or constant Supervision/Assistance  Patient can return home with the following  Help with stairs or ramp for entrance;Assist for transportation;A little help with walking and/or transfers;A little help with bathing/dressing/bathroom   Equipment Recommendations  BSC/3in1    Recommendations  for Other Services      Precautions / Restrictions Precautions Precautions: Fall Restrictions Weight Bearing Restrictions: No       Mobility Bed Mobility Overal bed mobility: Needs Assistance             General bed mobility comments: deferred. Pt in recliner at start/end of session.    Transfers Overall transfer level: Needs assistance Equipment used: Rolling walker (2 wheels) Transfers: Sit to/from Stand Sit to Stand: Min assist, Min guard           General transfer comment: MIN A to STS from recliner. MIN GUARD-MIN A for functional mobility/standing activity.     Balance Overall balance assessment: Needs assistance Sitting-balance support: Feet supported Sitting balance-Leahy Scale: Fair Sitting balance - Comments: Steady static sitting with UE support.   Standing balance support: Reliant on assistive device for balance, During functional activity, Bilateral upper extremity supported, Single extremity supported Standing balance-Leahy Scale: Poor                             ADL either performed or assessed with clinical judgement   ADL Overall ADL's : Needs assistance/impaired     Grooming: Wash/dry hands;Wash/dry face;Standing;Minimal assistance;Cueing for safety;With adaptive equipment Grooming Details (indicate cue type and reason): close MIN GUARD-Min A for standing balance, fatigues quickly with functional activity.         Upper Body Dressing : Set up;Supervision/safety;Sitting                   Functional mobility during ADLs: Min guard;Minimal assistance;Rolling walker (2 wheels)      Extremity/Trunk Assessment Upper Extremity Assessment Upper Extremity Assessment: Generalized weakness   Lower  Extremity Assessment Lower Extremity Assessment: Generalized weakness        Vision Ability to See in Adequate Light: 3 Highly impaired Patient Visual Report: No change from baseline     Perception     Praxis       Cognition Arousal/Alertness: Awake/alert Behavior During Therapy: WFL for tasks assessed/performed Overall Cognitive Status: No family/caregiver present to determine baseline cognitive functioning                                 General Comments: Able to follow VCs with increased time, no family present at bedside to determined baseline, pt visually impaired and HOH        Exercises Other Exercises Other Exercises: OT facilitated seated UB dressing and standing grooming as described above (See ADL section for additional detail. Pt educated on safety, falls prevention, and safe use of AE/DME for ADL management t/o session.    Shoulder Instructions       General Comments      Pertinent Vitals/ Pain       Pain Assessment Pain Assessment: No/denies pain  Home Living                                          Prior Functioning/Environment              Frequency  Min 2X/week        Progress Toward Goals  OT Goals(current goals can now be found in the care plan section)  Progress towards OT goals: Progressing toward goals  Acute Rehab OT Goals Patient Stated Goal: to get stronger OT Goal Formulation: With patient Time For Goal Achievement: 02/02/22 Potential to Achieve Goals: Good  Plan Discharge plan remains appropriate;Discharge plan needs to be updated    Co-evaluation                 AM-PAC OT "6 Clicks" Daily Activity     Outcome Measure   Help from another person eating meals?: None Help from another person taking care of personal grooming?: A Little Help from another person toileting, which includes using toliet, bedpan, or urinal?: A Little Help from another person bathing (including washing, rinsing, drying)?: A Lot Help from another person to put on and taking off regular upper body clothing?: A Little Help from another person to put on and taking off regular lower body clothing?: A Little 6 Click Score: 18     End of Session Equipment Utilized During Treatment: Rolling walker (2 wheels)  OT Visit Diagnosis: Unsteadiness on feet (R26.81);Muscle weakness (generalized) (M62.81)   Activity Tolerance Patient limited by fatigue   Patient Left in bed;with call bell/phone within reach;with bed alarm set   Nurse Communication Mobility status        Time: 1331-1350 OT Time Calculation (min): 19 min  Charges: OT General Charges $OT Visit: 1 Visit OT Treatments $Self Care/Home Management : 8-22 mins  Shara Blazing, M.S., OTR/L 01/20/22, 2:51 PM

## 2022-01-20 NOTE — TOC Transition Note (Addendum)
Transition of Care [facility]) - CM/SW Discharge Note   Patient Details  Name: Scott Ashley MRN: [id number] Date of Birth: 01/05/1930  [facility] CM/SW Contact:  Colen Darling, [place] Phone Number: 01/20/2022, 11:15 AM   Clinical Narrative:     TOC spoke to the patient and granddaughter. The patient has been accepted to [facility] for RN, PT and OT. Patient has a walker at home.  TOC requested 3n1 and transport chair orders from the provider. [employer] can assist. TOC completing narratives.  Final next level of care: [place] Barriers to Discharge: Barriers Resolved   Patient Goals and CMS Choice      [facility]  Discharge Placement                  Name of family member notified: [name] Patient and family notified of of transfer: 01/20/22  Discharge Plan and Services                          HH Arranged: RN, PT, OT [facility] Agency: [place] Date [place]: 01/20/22 Time [place]: 1114 Representative spoke with at [place]: [place]  Social Determinants of Health ([place]) Interventions     Readmission Risk Interventions     No data to display

## 2022-01-20 NOTE — Telephone Encounter (Signed)
Author called grandDTR to acknowledge current DC plan, update regarding recent progression in AMB tolerance, and ascertain any needs for DME to allow family to provide safe and adequate care for pt at home. Scott Ashley is agreeable that a transport chair and a BSC (3-in-1) would help achieve the task of safe caregiver assistance at home. Discussed with OT, MD, TOC, RN. Orders relayed.   2:39 PM, 01/20/22 [name], PT, DPT Physical Therapist - [facility]  [phone] [facility])

## 2022-01-20 NOTE — TOC CM/SW Note (Addendum)
Patient: Scott Ashley. [place] Diagnosis: AKI DOB: 11-Oct-1929 Height: 5'7" Weight:151 lb  BSC Narrative:  Patient is confined to one room and cannot ambulate or make it to the restroom

## 2022-01-21 DIAGNOSIS — H409 Unspecified glaucoma: Secondary | ICD-10-CM | POA: Diagnosis not present

## 2022-01-21 DIAGNOSIS — I10 Essential (primary) hypertension: Secondary | ICD-10-CM | POA: Diagnosis not present

## 2022-01-21 DIAGNOSIS — R338 Other retention of urine: Secondary | ICD-10-CM | POA: Diagnosis not present

## 2022-01-21 DIAGNOSIS — Z9181 History of falling: Secondary | ICD-10-CM | POA: Diagnosis not present

## 2022-01-21 DIAGNOSIS — G4700 Insomnia, unspecified: Secondary | ICD-10-CM | POA: Diagnosis not present

## 2022-01-21 DIAGNOSIS — E7800 Pure hypercholesterolemia, unspecified: Secondary | ICD-10-CM | POA: Diagnosis not present

## 2022-01-21 DIAGNOSIS — I129 Hypertensive chronic kidney disease with stage 1 through stage 4 chronic kidney disease, or unspecified chronic kidney disease: Secondary | ICD-10-CM | POA: Diagnosis not present

## 2022-01-21 DIAGNOSIS — E43 Unspecified severe protein-calorie malnutrition: Secondary | ICD-10-CM | POA: Diagnosis not present

## 2022-01-21 DIAGNOSIS — Z87440 Personal history of urinary (tract) infections: Secondary | ICD-10-CM | POA: Diagnosis not present

## 2022-01-21 DIAGNOSIS — N138 Other obstructive and reflux uropathy: Secondary | ICD-10-CM | POA: Diagnosis not present

## 2022-01-21 DIAGNOSIS — Z466 Encounter for fitting and adjustment of urinary device: Secondary | ICD-10-CM | POA: Diagnosis not present

## 2022-01-21 DIAGNOSIS — N183 Chronic kidney disease, stage 3 unspecified: Secondary | ICD-10-CM | POA: Diagnosis not present

## 2022-01-24 ENCOUNTER — Telehealth: Payer: Self-pay | Admitting: Family Medicine

## 2022-01-24 DIAGNOSIS — E7800 Pure hypercholesterolemia, unspecified: Secondary | ICD-10-CM | POA: Diagnosis not present

## 2022-01-24 DIAGNOSIS — Z9181 History of falling: Secondary | ICD-10-CM | POA: Diagnosis not present

## 2022-01-24 DIAGNOSIS — G4700 Insomnia, unspecified: Secondary | ICD-10-CM | POA: Diagnosis not present

## 2022-01-24 DIAGNOSIS — H409 Unspecified glaucoma: Secondary | ICD-10-CM | POA: Diagnosis not present

## 2022-01-24 DIAGNOSIS — Z466 Encounter for fitting and adjustment of urinary device: Secondary | ICD-10-CM | POA: Diagnosis not present

## 2022-01-24 DIAGNOSIS — I10 Essential (primary) hypertension: Secondary | ICD-10-CM | POA: Diagnosis not present

## 2022-01-24 DIAGNOSIS — N138 Other obstructive and reflux uropathy: Secondary | ICD-10-CM | POA: Diagnosis not present

## 2022-01-24 DIAGNOSIS — I129 Hypertensive chronic kidney disease with stage 1 through stage 4 chronic kidney disease, or unspecified chronic kidney disease: Secondary | ICD-10-CM | POA: Diagnosis not present

## 2022-01-24 DIAGNOSIS — Z87440 Personal history of urinary (tract) infections: Secondary | ICD-10-CM | POA: Diagnosis not present

## 2022-01-24 DIAGNOSIS — N183 Chronic kidney disease, stage 3 unspecified: Secondary | ICD-10-CM | POA: Diagnosis not present

## 2022-01-24 DIAGNOSIS — R338 Other retention of urine: Secondary | ICD-10-CM | POA: Diagnosis not present

## 2022-01-24 DIAGNOSIS — E43 Unspecified severe protein-calorie malnutrition: Secondary | ICD-10-CM | POA: Diagnosis not present

## 2022-01-24 NOTE — Telephone Encounter (Signed)
Home Health Verbal Orders - Caller/Agency: [facility] Number: [phone] ok to leave a voice mail  Requesting OT/PT/Skilled Nursing/Social Work/Speech Therapy: Nurse  Frequency: 1 time a week for 6 weeks for BPH care and foley mgmt.

## 2022-01-24 NOTE — Telephone Encounter (Signed)
Please proceed w/ verbal orders.  [name], [facility] 01/24/2022, 1:30 PM

## 2022-01-25 DIAGNOSIS — R338 Other retention of urine: Secondary | ICD-10-CM | POA: Diagnosis not present

## 2022-01-25 DIAGNOSIS — N138 Other obstructive and reflux uropathy: Secondary | ICD-10-CM | POA: Diagnosis not present

## 2022-01-25 DIAGNOSIS — Z9181 History of falling: Secondary | ICD-10-CM | POA: Diagnosis not present

## 2022-01-25 DIAGNOSIS — G4700 Insomnia, unspecified: Secondary | ICD-10-CM | POA: Diagnosis not present

## 2022-01-25 DIAGNOSIS — N183 Chronic kidney disease, stage 3 unspecified: Secondary | ICD-10-CM | POA: Diagnosis not present

## 2022-01-25 DIAGNOSIS — E43 Unspecified severe protein-calorie malnutrition: Secondary | ICD-10-CM | POA: Diagnosis not present

## 2022-01-25 DIAGNOSIS — Z466 Encounter for fitting and adjustment of urinary device: Secondary | ICD-10-CM | POA: Diagnosis not present

## 2022-01-25 DIAGNOSIS — Z87440 Personal history of urinary (tract) infections: Secondary | ICD-10-CM | POA: Diagnosis not present

## 2022-01-25 DIAGNOSIS — H409 Unspecified glaucoma: Secondary | ICD-10-CM | POA: Diagnosis not present

## 2022-01-25 DIAGNOSIS — I10 Essential (primary) hypertension: Secondary | ICD-10-CM | POA: Diagnosis not present

## 2022-01-25 DIAGNOSIS — I129 Hypertensive chronic kidney disease with stage 1 through stage 4 chronic kidney disease, or unspecified chronic kidney disease: Secondary | ICD-10-CM | POA: Diagnosis not present

## 2022-01-25 DIAGNOSIS — E7800 Pure hypercholesterolemia, unspecified: Secondary | ICD-10-CM | POA: Diagnosis not present

## 2022-01-25 NOTE — Telephone Encounter (Signed)
Scott Ashley is aware to proceed with orders per Dr. Raliegh Ip.

## 2022-01-27 ENCOUNTER — Ambulatory Visit (INDEPENDENT_AMBULATORY_CARE_PROVIDER_SITE_OTHER): Payer: Medicare Other | Admitting: Physician Assistant

## 2022-01-27 ENCOUNTER — Encounter: Payer: Self-pay | Admitting: Urology

## 2022-01-27 ENCOUNTER — Ambulatory Visit: Payer: Medicare Other | Admitting: Urology

## 2022-01-27 VITALS — BP 115/71 | HR 81

## 2022-01-27 DIAGNOSIS — R3915 Urgency of urination: Secondary | ICD-10-CM | POA: Diagnosis not present

## 2022-01-27 DIAGNOSIS — E7800 Pure hypercholesterolemia, unspecified: Secondary | ICD-10-CM | POA: Diagnosis not present

## 2022-01-27 DIAGNOSIS — Z9181 History of falling: Secondary | ICD-10-CM | POA: Diagnosis not present

## 2022-01-27 DIAGNOSIS — N138 Other obstructive and reflux uropathy: Secondary | ICD-10-CM | POA: Diagnosis not present

## 2022-01-27 DIAGNOSIS — R338 Other retention of urine: Secondary | ICD-10-CM | POA: Diagnosis not present

## 2022-01-27 DIAGNOSIS — T83098A Other mechanical complication of other indwelling urethral catheter, initial encounter: Secondary | ICD-10-CM

## 2022-01-27 DIAGNOSIS — Z87440 Personal history of urinary (tract) infections: Secondary | ICD-10-CM | POA: Diagnosis not present

## 2022-01-27 DIAGNOSIS — Z466 Encounter for fitting and adjustment of urinary device: Secondary | ICD-10-CM | POA: Diagnosis not present

## 2022-01-27 DIAGNOSIS — N401 Enlarged prostate with lower urinary tract symptoms: Secondary | ICD-10-CM

## 2022-01-27 DIAGNOSIS — H409 Unspecified glaucoma: Secondary | ICD-10-CM | POA: Diagnosis not present

## 2022-01-27 DIAGNOSIS — E43 Unspecified severe protein-calorie malnutrition: Secondary | ICD-10-CM | POA: Diagnosis not present

## 2022-01-27 DIAGNOSIS — G4700 Insomnia, unspecified: Secondary | ICD-10-CM | POA: Diagnosis not present

## 2022-01-27 DIAGNOSIS — I10 Essential (primary) hypertension: Secondary | ICD-10-CM | POA: Diagnosis not present

## 2022-01-27 DIAGNOSIS — N183 Chronic kidney disease, stage 3 unspecified: Secondary | ICD-10-CM | POA: Diagnosis not present

## 2022-01-27 DIAGNOSIS — I129 Hypertensive chronic kidney disease with stage 1 through stage 4 chronic kidney disease, or unspecified chronic kidney disease: Secondary | ICD-10-CM | POA: Diagnosis not present

## 2022-01-27 LAB — BLADDER SCAN AMB NON-IMAGING: Scan Result: 410

## 2022-01-27 MED ORDER — FINASTERIDE 5 MG PO TABS: 5.0000 mg | ORAL_TABLET | Freq: Every day | ORAL | 11 refills | Status: DC

## 2022-01-27 MED ORDER — CEPHALEXIN 250 MG PO CAPS
500.0000 mg | ORAL_CAPSULE | Freq: Once | ORAL | Status: AC
  Administered 2022-01-27: 500 mg via ORAL

## 2022-01-27 MED ORDER — CEPHALEXIN 500 MG PO CAPS: 500.0000 mg | ORAL_CAPSULE | Freq: Two times a day (BID) | ORAL | 0 refills | Status: AC

## 2022-01-27 NOTE — Progress Notes (Signed)
Catheter Removal  Patient is present today for a catheter removal. 82m of water was drained from the balloon. A 18FR foley cath was removed from the bladder, no complications were noted. Patient tolerated well.  Performed by: OGordy Clement CMA  Follow up/ Additional notes: RTC this afternoon for PVR

## 2022-01-27 NOTE — Progress Notes (Signed)
   01/27/2022 9:13 AM   Scott Ashley 03/17/1929 [id number]  Reason for visit: Follow up urinary retention and renal failure, small left renal mass  HPI: Frail 88 year old male here today with his granddaughter who helps provide most of the history for the above issues.  I saw him in November 2020 for surveillance for a small 1 cm stable left renal mass as well as BPH, but he stopped following up during the [place] pandemic.  He had been on Flomax long-term for mild urinary symptoms.  I personally viewed and interpreted the CT scan from July 2019 that shows a 145 g prostate.  He was admitted 01/17/2022 with weakness and found to have incomplete bladder emptying(>1L) and elevated creatinine of 11, urology was ultimately consulted for a catheter placement and Debroah Loop, PA was able to place a 18 coud catheter.  Urinalysis was suspicious with greater than 50 RBC, greater than 50 WBC, rare bacteria, large leukocytes, but was not sent for culture.  Renal function normalized with catheter drainage, and he was discharged with catheter in place.  He presents for voiding trial and discussed bladder management options.  We reviewed options including chronic Foley with monthly changes, intermittent catheterization(would not be a candidate secondary to his frailty), trial of void with maximal medical therapy and adding finasteride to his Flomax, or consideration of an outlet procedure like HOLEP or PAE.  He appears quite fragile in clinic today, and I do not think he would be a good candidate for an outlet procedure.  I recommended a trial of void on maximal medical therapy with Flomax and finasteride.  Foley was removed today and Keflex was given for prophylaxis.  In the setting of his prior suspicious urinalysis I recommended 3 days of Keflex.  Finasteride was added.  He returned this afternoon and has been unable to urinate with elevated PVR for 10 mL and opted for Foley catheter replacement.   See procedure note.  RTC 2 weeks repeat voiding trial Continue Flomax and finasteride If persistent retention, consider either chronic Foley with monthly changes or referral to IR to consider [place], [place] [address], [place], [place] [address] [phone]

## 2022-01-27 NOTE — Progress Notes (Signed)
Patient returns to clinic this afternoon.  He has not been able to void.  Bladder scan with 410 mL.  Results for orders placed or performed in visit on 01/27/22  Bladder Scan (Post Void Residual) in office  Result Value Ref Range   Scan Result 410 ml     Trial failed.  I offered him Foley catheter replacement and he agreed, see below. Foley was initially nondraining so I proceeded with irrigation with successful clearance of some clot material, again see below. Will plan for repeat VT in 2 weeks. We discussed warning signs of gross hematuria including nondraining Foley and passage of thick, ketchup-like urine.  Simple Catheter Placement  Due to urinary retention patient is present today for a foley cath placement.  Patient was cleaned and prepped in a sterile fashion with betadine and 2% lidocaine jelly was instilled into the urethra. A 18 FR coude foley catheter was inserted, urine return was not noted.  The balloon was filled with 10cc of sterile water.  A leg bag was attached for drainage. Patient was also given a night bag to take home and was given instruction on how to change from one bag to another.  Patient was given instruction on proper catheter care.  Patient tolerated well.  Bladder Irrigation  Due to nondraining Foley patient is present today for a bladder irrigation. Patient was cleaned and prepped in a sterile fashion. 500 mL of sterile water was instilled into the bladder with a 6m Toomey syringe through the catheter in place.  9071mof urine return was cleared from the bladder with evacuation of 10ccs of clot material. Efflux remained stably pink with the procedure and the catheter irrigated easily. Upon completion, the catheter was draining well and was reattached to the leg bag for drainage. Patient tolerated well.   Performed by: SaDebroah LoopPA-C and AnTiffany KocherCMA  Follow up: Return in about 2 weeks (around 02/10/2022) for Voiding trial.

## 2022-01-28 ENCOUNTER — Telehealth: Payer: Self-pay | Admitting: Family Medicine

## 2022-01-28 NOTE — Telephone Encounter (Signed)
Home Health Verbal Orders - Caller/Agency: [facility] Number: [phone]  Requesting OT/PT/Skilled Nursing/Social Work/Speech Therapy: OT   Frequency: 1 week 6

## 2022-01-28 NOTE — Telephone Encounter (Signed)
Okay to proceed with orders  [name], [facility] 01/28/2022, 11:28 AM

## 2022-01-28 NOTE — Telephone Encounter (Signed)
Scott Ashley is aware of orders to proceed per Dr. Raliegh Ip.

## 2022-02-01 DIAGNOSIS — R338 Other retention of urine: Secondary | ICD-10-CM | POA: Diagnosis not present

## 2022-02-01 DIAGNOSIS — H409 Unspecified glaucoma: Secondary | ICD-10-CM | POA: Diagnosis not present

## 2022-02-01 DIAGNOSIS — Z87440 Personal history of urinary (tract) infections: Secondary | ICD-10-CM | POA: Diagnosis not present

## 2022-02-01 DIAGNOSIS — I10 Essential (primary) hypertension: Secondary | ICD-10-CM | POA: Diagnosis not present

## 2022-02-01 DIAGNOSIS — Z9181 History of falling: Secondary | ICD-10-CM | POA: Diagnosis not present

## 2022-02-01 DIAGNOSIS — G4700 Insomnia, unspecified: Secondary | ICD-10-CM | POA: Diagnosis not present

## 2022-02-01 DIAGNOSIS — N183 Chronic kidney disease, stage 3 unspecified: Secondary | ICD-10-CM | POA: Diagnosis not present

## 2022-02-01 DIAGNOSIS — I129 Hypertensive chronic kidney disease with stage 1 through stage 4 chronic kidney disease, or unspecified chronic kidney disease: Secondary | ICD-10-CM | POA: Diagnosis not present

## 2022-02-01 DIAGNOSIS — N138 Other obstructive and reflux uropathy: Secondary | ICD-10-CM | POA: Diagnosis not present

## 2022-02-01 DIAGNOSIS — E43 Unspecified severe protein-calorie malnutrition: Secondary | ICD-10-CM | POA: Diagnosis not present

## 2022-02-01 DIAGNOSIS — Z466 Encounter for fitting and adjustment of urinary device: Secondary | ICD-10-CM | POA: Diagnosis not present

## 2022-02-01 DIAGNOSIS — E7800 Pure hypercholesterolemia, unspecified: Secondary | ICD-10-CM | POA: Diagnosis not present

## 2022-02-03 DIAGNOSIS — E7800 Pure hypercholesterolemia, unspecified: Secondary | ICD-10-CM | POA: Diagnosis not present

## 2022-02-03 DIAGNOSIS — I129 Hypertensive chronic kidney disease with stage 1 through stage 4 chronic kidney disease, or unspecified chronic kidney disease: Secondary | ICD-10-CM | POA: Diagnosis not present

## 2022-02-03 DIAGNOSIS — G4700 Insomnia, unspecified: Secondary | ICD-10-CM | POA: Diagnosis not present

## 2022-02-03 DIAGNOSIS — Z87440 Personal history of urinary (tract) infections: Secondary | ICD-10-CM | POA: Diagnosis not present

## 2022-02-03 DIAGNOSIS — R338 Other retention of urine: Secondary | ICD-10-CM | POA: Diagnosis not present

## 2022-02-03 DIAGNOSIS — I10 Essential (primary) hypertension: Secondary | ICD-10-CM | POA: Diagnosis not present

## 2022-02-03 DIAGNOSIS — E43 Unspecified severe protein-calorie malnutrition: Secondary | ICD-10-CM | POA: Diagnosis not present

## 2022-02-03 DIAGNOSIS — Z466 Encounter for fitting and adjustment of urinary device: Secondary | ICD-10-CM | POA: Diagnosis not present

## 2022-02-03 DIAGNOSIS — N183 Chronic kidney disease, stage 3 unspecified: Secondary | ICD-10-CM | POA: Diagnosis not present

## 2022-02-03 DIAGNOSIS — Z9181 History of falling: Secondary | ICD-10-CM | POA: Diagnosis not present

## 2022-02-03 DIAGNOSIS — H409 Unspecified glaucoma: Secondary | ICD-10-CM | POA: Diagnosis not present

## 2022-02-03 DIAGNOSIS — N138 Other obstructive and reflux uropathy: Secondary | ICD-10-CM | POA: Diagnosis not present

## 2022-02-04 DIAGNOSIS — N138 Other obstructive and reflux uropathy: Secondary | ICD-10-CM | POA: Diagnosis not present

## 2022-02-04 DIAGNOSIS — H409 Unspecified glaucoma: Secondary | ICD-10-CM | POA: Diagnosis not present

## 2022-02-04 DIAGNOSIS — I10 Essential (primary) hypertension: Secondary | ICD-10-CM | POA: Diagnosis not present

## 2022-02-04 DIAGNOSIS — E43 Unspecified severe protein-calorie malnutrition: Secondary | ICD-10-CM | POA: Diagnosis not present

## 2022-02-04 DIAGNOSIS — Z87440 Personal history of urinary (tract) infections: Secondary | ICD-10-CM | POA: Diagnosis not present

## 2022-02-04 DIAGNOSIS — R338 Other retention of urine: Secondary | ICD-10-CM | POA: Diagnosis not present

## 2022-02-04 DIAGNOSIS — I129 Hypertensive chronic kidney disease with stage 1 through stage 4 chronic kidney disease, or unspecified chronic kidney disease: Secondary | ICD-10-CM | POA: Diagnosis not present

## 2022-02-04 DIAGNOSIS — N183 Chronic kidney disease, stage 3 unspecified: Secondary | ICD-10-CM | POA: Diagnosis not present

## 2022-02-04 DIAGNOSIS — Z466 Encounter for fitting and adjustment of urinary device: Secondary | ICD-10-CM | POA: Diagnosis not present

## 2022-02-04 DIAGNOSIS — G4700 Insomnia, unspecified: Secondary | ICD-10-CM | POA: Diagnosis not present

## 2022-02-04 DIAGNOSIS — Z9181 History of falling: Secondary | ICD-10-CM | POA: Diagnosis not present

## 2022-02-04 DIAGNOSIS — E7800 Pure hypercholesterolemia, unspecified: Secondary | ICD-10-CM | POA: Diagnosis not present

## 2022-02-10 DIAGNOSIS — I129 Hypertensive chronic kidney disease with stage 1 through stage 4 chronic kidney disease, or unspecified chronic kidney disease: Secondary | ICD-10-CM | POA: Diagnosis not present

## 2022-02-10 DIAGNOSIS — Z9181 History of falling: Secondary | ICD-10-CM | POA: Diagnosis not present

## 2022-02-10 DIAGNOSIS — I10 Essential (primary) hypertension: Secondary | ICD-10-CM | POA: Diagnosis not present

## 2022-02-10 DIAGNOSIS — N183 Chronic kidney disease, stage 3 unspecified: Secondary | ICD-10-CM | POA: Diagnosis not present

## 2022-02-10 DIAGNOSIS — H409 Unspecified glaucoma: Secondary | ICD-10-CM | POA: Diagnosis not present

## 2022-02-10 DIAGNOSIS — N138 Other obstructive and reflux uropathy: Secondary | ICD-10-CM | POA: Diagnosis not present

## 2022-02-10 DIAGNOSIS — R338 Other retention of urine: Secondary | ICD-10-CM | POA: Diagnosis not present

## 2022-02-10 DIAGNOSIS — G4700 Insomnia, unspecified: Secondary | ICD-10-CM | POA: Diagnosis not present

## 2022-02-10 DIAGNOSIS — E43 Unspecified severe protein-calorie malnutrition: Secondary | ICD-10-CM | POA: Diagnosis not present

## 2022-02-10 DIAGNOSIS — E7800 Pure hypercholesterolemia, unspecified: Secondary | ICD-10-CM | POA: Diagnosis not present

## 2022-02-10 DIAGNOSIS — Z87440 Personal history of urinary (tract) infections: Secondary | ICD-10-CM | POA: Diagnosis not present

## 2022-02-10 DIAGNOSIS — Z466 Encounter for fitting and adjustment of urinary device: Secondary | ICD-10-CM | POA: Diagnosis not present

## 2022-02-11 DIAGNOSIS — N183 Chronic kidney disease, stage 3 unspecified: Secondary | ICD-10-CM | POA: Diagnosis not present

## 2022-02-11 DIAGNOSIS — Z9181 History of falling: Secondary | ICD-10-CM | POA: Diagnosis not present

## 2022-02-11 DIAGNOSIS — E7800 Pure hypercholesterolemia, unspecified: Secondary | ICD-10-CM | POA: Diagnosis not present

## 2022-02-11 DIAGNOSIS — H409 Unspecified glaucoma: Secondary | ICD-10-CM | POA: Diagnosis not present

## 2022-02-11 DIAGNOSIS — I129 Hypertensive chronic kidney disease with stage 1 through stage 4 chronic kidney disease, or unspecified chronic kidney disease: Secondary | ICD-10-CM | POA: Diagnosis not present

## 2022-02-11 DIAGNOSIS — Z466 Encounter for fitting and adjustment of urinary device: Secondary | ICD-10-CM | POA: Diagnosis not present

## 2022-02-11 DIAGNOSIS — I10 Essential (primary) hypertension: Secondary | ICD-10-CM | POA: Diagnosis not present

## 2022-02-11 DIAGNOSIS — E43 Unspecified severe protein-calorie malnutrition: Secondary | ICD-10-CM | POA: Diagnosis not present

## 2022-02-11 DIAGNOSIS — Z87440 Personal history of urinary (tract) infections: Secondary | ICD-10-CM | POA: Diagnosis not present

## 2022-02-11 DIAGNOSIS — R338 Other retention of urine: Secondary | ICD-10-CM | POA: Diagnosis not present

## 2022-02-11 DIAGNOSIS — N138 Other obstructive and reflux uropathy: Secondary | ICD-10-CM | POA: Diagnosis not present

## 2022-02-11 DIAGNOSIS — G4700 Insomnia, unspecified: Secondary | ICD-10-CM | POA: Diagnosis not present

## 2022-02-15 ENCOUNTER — Ambulatory Visit: Payer: Medicare Other | Admitting: Physician Assistant

## 2022-02-15 ENCOUNTER — Encounter: Payer: Self-pay | Admitting: Physician Assistant

## 2022-02-15 VITALS — BP 127/86 | HR 128

## 2022-02-15 DIAGNOSIS — Z9181 History of falling: Secondary | ICD-10-CM | POA: Diagnosis not present

## 2022-02-15 DIAGNOSIS — G4700 Insomnia, unspecified: Secondary | ICD-10-CM | POA: Diagnosis not present

## 2022-02-15 DIAGNOSIS — N401 Enlarged prostate with lower urinary tract symptoms: Secondary | ICD-10-CM

## 2022-02-15 DIAGNOSIS — E43 Unspecified severe protein-calorie malnutrition: Secondary | ICD-10-CM | POA: Diagnosis not present

## 2022-02-15 DIAGNOSIS — Z466 Encounter for fitting and adjustment of urinary device: Secondary | ICD-10-CM | POA: Diagnosis not present

## 2022-02-15 DIAGNOSIS — N138 Other obstructive and reflux uropathy: Secondary | ICD-10-CM

## 2022-02-15 DIAGNOSIS — E7800 Pure hypercholesterolemia, unspecified: Secondary | ICD-10-CM | POA: Diagnosis not present

## 2022-02-15 DIAGNOSIS — H409 Unspecified glaucoma: Secondary | ICD-10-CM | POA: Diagnosis not present

## 2022-02-15 DIAGNOSIS — I10 Essential (primary) hypertension: Secondary | ICD-10-CM | POA: Diagnosis not present

## 2022-02-15 DIAGNOSIS — Z87440 Personal history of urinary (tract) infections: Secondary | ICD-10-CM | POA: Diagnosis not present

## 2022-02-15 DIAGNOSIS — N183 Chronic kidney disease, stage 3 unspecified: Secondary | ICD-10-CM | POA: Diagnosis not present

## 2022-02-15 DIAGNOSIS — R338 Other retention of urine: Secondary | ICD-10-CM | POA: Diagnosis not present

## 2022-02-15 DIAGNOSIS — I129 Hypertensive chronic kidney disease with stage 1 through stage 4 chronic kidney disease, or unspecified chronic kidney disease: Secondary | ICD-10-CM | POA: Diagnosis not present

## 2022-02-15 NOTE — Progress Notes (Signed)
Patient presented to clinic today for scheduled voiding trial, however there is significant inclement weather anticipated this afternoon that may interfere with his afternoon PVR visit. We discussed proceeding with fill-and-pull voiding trial today versus rescheduling and they elected for the latter. Appointments rescheduled; will refund today's copay. Today is a no-charge visit.  Debroah Loop, PA-C  02/15/22 8:48 AM

## 2022-02-16 DIAGNOSIS — R338 Other retention of urine: Secondary | ICD-10-CM | POA: Diagnosis not present

## 2022-02-16 DIAGNOSIS — Z466 Encounter for fitting and adjustment of urinary device: Secondary | ICD-10-CM | POA: Diagnosis not present

## 2022-02-16 DIAGNOSIS — Z87440 Personal history of urinary (tract) infections: Secondary | ICD-10-CM | POA: Diagnosis not present

## 2022-02-16 DIAGNOSIS — E7800 Pure hypercholesterolemia, unspecified: Secondary | ICD-10-CM | POA: Diagnosis not present

## 2022-02-16 DIAGNOSIS — H409 Unspecified glaucoma: Secondary | ICD-10-CM | POA: Diagnosis not present

## 2022-02-16 DIAGNOSIS — I10 Essential (primary) hypertension: Secondary | ICD-10-CM | POA: Diagnosis not present

## 2022-02-16 DIAGNOSIS — N138 Other obstructive and reflux uropathy: Secondary | ICD-10-CM | POA: Diagnosis not present

## 2022-02-16 DIAGNOSIS — G4700 Insomnia, unspecified: Secondary | ICD-10-CM | POA: Diagnosis not present

## 2022-02-16 DIAGNOSIS — E43 Unspecified severe protein-calorie malnutrition: Secondary | ICD-10-CM | POA: Diagnosis not present

## 2022-02-16 DIAGNOSIS — N183 Chronic kidney disease, stage 3 unspecified: Secondary | ICD-10-CM | POA: Diagnosis not present

## 2022-02-16 DIAGNOSIS — Z9181 History of falling: Secondary | ICD-10-CM | POA: Diagnosis not present

## 2022-02-16 DIAGNOSIS — I129 Hypertensive chronic kidney disease with stage 1 through stage 4 chronic kidney disease, or unspecified chronic kidney disease: Secondary | ICD-10-CM | POA: Diagnosis not present

## 2022-02-17 DIAGNOSIS — E43 Unspecified severe protein-calorie malnutrition: Secondary | ICD-10-CM | POA: Diagnosis not present

## 2022-02-17 DIAGNOSIS — Z87440 Personal history of urinary (tract) infections: Secondary | ICD-10-CM | POA: Diagnosis not present

## 2022-02-17 DIAGNOSIS — R338 Other retention of urine: Secondary | ICD-10-CM | POA: Diagnosis not present

## 2022-02-17 DIAGNOSIS — N138 Other obstructive and reflux uropathy: Secondary | ICD-10-CM | POA: Diagnosis not present

## 2022-02-17 DIAGNOSIS — Z466 Encounter for fitting and adjustment of urinary device: Secondary | ICD-10-CM | POA: Diagnosis not present

## 2022-02-17 DIAGNOSIS — Z9181 History of falling: Secondary | ICD-10-CM | POA: Diagnosis not present

## 2022-02-17 DIAGNOSIS — E7800 Pure hypercholesterolemia, unspecified: Secondary | ICD-10-CM | POA: Diagnosis not present

## 2022-02-17 DIAGNOSIS — I129 Hypertensive chronic kidney disease with stage 1 through stage 4 chronic kidney disease, or unspecified chronic kidney disease: Secondary | ICD-10-CM | POA: Diagnosis not present

## 2022-02-17 DIAGNOSIS — G4700 Insomnia, unspecified: Secondary | ICD-10-CM | POA: Diagnosis not present

## 2022-02-17 DIAGNOSIS — H409 Unspecified glaucoma: Secondary | ICD-10-CM | POA: Diagnosis not present

## 2022-02-17 DIAGNOSIS — N183 Chronic kidney disease, stage 3 unspecified: Secondary | ICD-10-CM | POA: Diagnosis not present

## 2022-02-17 DIAGNOSIS — I10 Essential (primary) hypertension: Secondary | ICD-10-CM | POA: Diagnosis not present

## 2022-02-20 DIAGNOSIS — M6281 Muscle weakness (generalized): Secondary | ICD-10-CM | POA: Diagnosis not present

## 2022-02-22 ENCOUNTER — Ambulatory Visit (INDEPENDENT_AMBULATORY_CARE_PROVIDER_SITE_OTHER): Payer: Medicare Other | Admitting: Physician Assistant

## 2022-02-22 ENCOUNTER — Ambulatory Visit: Payer: Medicare Other | Admitting: Physician Assistant

## 2022-02-22 DIAGNOSIS — N138 Other obstructive and reflux uropathy: Secondary | ICD-10-CM | POA: Diagnosis not present

## 2022-02-22 DIAGNOSIS — N401 Enlarged prostate with lower urinary tract symptoms: Secondary | ICD-10-CM

## 2022-02-22 LAB — BLADDER SCAN AMB NON-IMAGING: Scan Result: 338 mL

## 2022-02-22 NOTE — Progress Notes (Signed)
Catheter Removal  Patient is present today for a catheter removal.  93m of water was drained from the balloon. A 16FR foley cath was removed from the bladder, no complications were noted. Patient tolerated well.  Performed by: JGaspar ColaCMA  Follow up/ Additional notes: This afternoon.

## 2022-02-22 NOTE — Progress Notes (Addendum)
02/22/2022 3:55 PM   Scott Ashley [birth year] [id number]  CC: Chief Complaint  Patient presents with   Benign Prostatic Hypertrophy    HPI: Scott Ashley is a 89 y.o. male with PMH BPH with prostatomegaly and urinary retention on Flomax and finasteride as well as a small left renal mass who presents today for repeat voiding trial.  He is accompanied today by his granddaughter, contributes to HPI.  Foley catheter removed in the morning, see separate procedure note for details.  He returned to clinic in the afternoon.  He has been unable to void but denies abdominal pain or the urge to void.  Bladder scan with 338 mL.  He is hesitant to pursue outlet procedures at this time.  He reports he is sleeping with fewer interruptions with the catheter in place and overall feels better.  He would prefer to keep the catheter in for another month and repeat another voiding trial at that time.  PSA trend as below: 05/22/2020: 3.00 07/22/2021: 2.85  PMH: Past Medical History:  Diagnosis Date   Anxiety    Glaucoma    Hypertension    Urinary incontinence     Surgical History: Past Surgical History:  Procedure Laterality Date   COLONOSCOPY WITH PROPOFOL N/A 10/10/2017   Procedure: COLONOSCOPY WITH PROPOFOL;  Surgeon: Lucilla Lame, MD;  Location[facility] ENDOSCOPY;  Service: Endoscopy;  Laterality: N/A;   ESOPHAGOGASTRODUODENOSCOPY (EGD) WITH PROPOFOL N/A 10/10/2017   Procedure: ESOPHAGOGASTRODUODENOSCOPY (EGD) WITH PROPOFOL;  Surgeon: Lucilla Lame, MD;  Location: [facility] ENDOSCOPY;  Service: Endoscopy;  Laterality: N/A;   ESOPHAGOGASTRODUODENOSCOPY (EGD) WITH PROPOFOL N/A 10/06/2020   Procedure: ESOPHAGOGASTRODUODENOSCOPY (EGD) WITH PROPOFOL;  Surgeon: Lucilla Lame, MD;  Location: [facility] ENDOSCOPY;  Service: Endoscopy;  Laterality: N/A;   HERNIA REPAIR     twice   TONSILLECTOMY      Home Medications:  Allergies as of 02/22/2022       Reactions   Codeine Other (See Comments)         Medication List        Accurate as of February 22, 2022  3:55 PM. If you have any questions, ask your nurse or doctor.          acetaminophen 500 MG tablet Commonly known as: TYLENOL Take 500 mg by mouth every 6 (six) hours as needed.   finasteride 5 MG tablet Commonly known as: PROSCAR Take 1 tablet (5 mg total) by mouth daily.   latanoprost 0.005 % ophthalmic solution Commonly known as: XALATAN Place 1 drop into both eyes at bedtime.   melatonin 5 MG Tabs Take 1 tablet (5 mg total) by mouth at bedtime as needed.   multivitamin with minerals Tabs tablet Take 1 tablet by mouth daily.   omeprazole 20 MG capsule Commonly known as: PRILOSEC TAKE 1 CAPSULE BY MOUTH DAILY   PROSTATE PO Take by mouth.   rosuvastatin 5 MG tablet Commonly known as: CRESTOR Take 1 tablet (5 mg total) by mouth 2 (two) times a week.   tamsulosin 0.4 MG Caps capsule Commonly known as: FLOMAX TAKE 1 CAPSULE BY MOUTH DAILY        Allergies:  Allergies  Allergen Reactions   Codeine Other (See Comments)    Family History: Family History  Problem Relation Age of Onset   ALS Mother     Social History:   reports that he has never smoked. He has never been exposed to tobacco smoke. He has never used smokeless tobacco. He reports that  he does not drink alcohol and does not use drugs.  Physical Exam: There were no vitals taken for this visit.  Constitutional:  Alert and oriented, no acute distress, nontoxic appearing HEENT: [place], AT Cardiovascular: No clubbing, cyanosis, or edema Respiratory: Normal respiratory effort, no increased work of breathing Skin: No rashes, bruises or suspicious lesions Neurologic: Grossly intact, no focal deficits, moving all 4 extremities Psychiatric: Normal mood and affect  Laboratory Data: Results for orders placed or performed in visit on 02/22/22  BLADDER SCAN AMB NON-IMAGING  Result Value Ref Range   Scan Result 338 mL   Simple Catheter  Placement  Due to urinary retention patient is present today for a foley cath placement.  Patient was cleaned and prepped in a sterile fashion with betadine and 2% lidocaine jelly was instilled into the urethra. A 18 FR coude foley catheter was inserted, urine return was noted  361m, urine was yellow in color.  The balloon was filled with 10cc of sterile water.  A leg bag was attached for drainage. Patient tolerated well, no complications were noted   Performed by: SDebroah Loop PA-C   Assessment & Plan:   1. BPH with obstruction/lower urinary tract symptoms Second outpatient voiding trial failed today.  We discussed various options including chronic indwelling Foley catheter, repeat voiding trial, and IR referral for consideration of PAE.  He prefers to reattempt a voiding trial in 1 month, which is reasonable.  Will continue Flomax and finasteride.  We discussed consideration of chronic indwelling Foley catheter if he is unable to pass a voiding trial on finasteride.  We also discussed that his recent PSA trend is normal, low risk for advanced prostate cancer.  Recommend discontinuing PSA monitoring.  Return in about 4 weeks (around 03/22/2022) for Voiding trial.  SDebroah Loop PA-C  [address] [place] [phone]

## 2022-02-23 DIAGNOSIS — Z466 Encounter for fitting and adjustment of urinary device: Secondary | ICD-10-CM | POA: Diagnosis not present

## 2022-02-23 DIAGNOSIS — N183 Chronic kidney disease, stage 3 unspecified: Secondary | ICD-10-CM | POA: Diagnosis not present

## 2022-02-23 DIAGNOSIS — R338 Other retention of urine: Secondary | ICD-10-CM | POA: Diagnosis not present

## 2022-02-23 DIAGNOSIS — E43 Unspecified severe protein-calorie malnutrition: Secondary | ICD-10-CM | POA: Diagnosis not present

## 2022-02-23 DIAGNOSIS — H409 Unspecified glaucoma: Secondary | ICD-10-CM | POA: Diagnosis not present

## 2022-02-23 DIAGNOSIS — G4700 Insomnia, unspecified: Secondary | ICD-10-CM | POA: Diagnosis not present

## 2022-02-23 DIAGNOSIS — Z87440 Personal history of urinary (tract) infections: Secondary | ICD-10-CM | POA: Diagnosis not present

## 2022-02-23 DIAGNOSIS — Z9181 History of falling: Secondary | ICD-10-CM | POA: Diagnosis not present

## 2022-02-23 DIAGNOSIS — E7800 Pure hypercholesterolemia, unspecified: Secondary | ICD-10-CM | POA: Diagnosis not present

## 2022-02-23 DIAGNOSIS — N138 Other obstructive and reflux uropathy: Secondary | ICD-10-CM | POA: Diagnosis not present

## 2022-02-23 DIAGNOSIS — I129 Hypertensive chronic kidney disease with stage 1 through stage 4 chronic kidney disease, or unspecified chronic kidney disease: Secondary | ICD-10-CM | POA: Diagnosis not present

## 2022-02-23 DIAGNOSIS — I10 Essential (primary) hypertension: Secondary | ICD-10-CM | POA: Diagnosis not present

## 2022-02-24 DIAGNOSIS — R338 Other retention of urine: Secondary | ICD-10-CM | POA: Diagnosis not present

## 2022-02-24 DIAGNOSIS — E7800 Pure hypercholesterolemia, unspecified: Secondary | ICD-10-CM | POA: Diagnosis not present

## 2022-02-24 DIAGNOSIS — E43 Unspecified severe protein-calorie malnutrition: Secondary | ICD-10-CM | POA: Diagnosis not present

## 2022-02-24 DIAGNOSIS — H409 Unspecified glaucoma: Secondary | ICD-10-CM | POA: Diagnosis not present

## 2022-02-24 DIAGNOSIS — Z87440 Personal history of urinary (tract) infections: Secondary | ICD-10-CM | POA: Diagnosis not present

## 2022-02-24 DIAGNOSIS — I129 Hypertensive chronic kidney disease with stage 1 through stage 4 chronic kidney disease, or unspecified chronic kidney disease: Secondary | ICD-10-CM | POA: Diagnosis not present

## 2022-02-24 DIAGNOSIS — N138 Other obstructive and reflux uropathy: Secondary | ICD-10-CM | POA: Diagnosis not present

## 2022-02-24 DIAGNOSIS — Z466 Encounter for fitting and adjustment of urinary device: Secondary | ICD-10-CM | POA: Diagnosis not present

## 2022-02-24 DIAGNOSIS — Z9181 History of falling: Secondary | ICD-10-CM | POA: Diagnosis not present

## 2022-02-24 DIAGNOSIS — I10 Essential (primary) hypertension: Secondary | ICD-10-CM | POA: Diagnosis not present

## 2022-02-24 DIAGNOSIS — N183 Chronic kidney disease, stage 3 unspecified: Secondary | ICD-10-CM | POA: Diagnosis not present

## 2022-02-24 DIAGNOSIS — G4700 Insomnia, unspecified: Secondary | ICD-10-CM | POA: Diagnosis not present

## 2022-03-01 DIAGNOSIS — N138 Other obstructive and reflux uropathy: Secondary | ICD-10-CM | POA: Diagnosis not present

## 2022-03-01 DIAGNOSIS — E7800 Pure hypercholesterolemia, unspecified: Secondary | ICD-10-CM | POA: Diagnosis not present

## 2022-03-01 DIAGNOSIS — Z9181 History of falling: Secondary | ICD-10-CM | POA: Diagnosis not present

## 2022-03-01 DIAGNOSIS — E43 Unspecified severe protein-calorie malnutrition: Secondary | ICD-10-CM | POA: Diagnosis not present

## 2022-03-01 DIAGNOSIS — Z87440 Personal history of urinary (tract) infections: Secondary | ICD-10-CM | POA: Diagnosis not present

## 2022-03-01 DIAGNOSIS — R338 Other retention of urine: Secondary | ICD-10-CM | POA: Diagnosis not present

## 2022-03-01 DIAGNOSIS — Z466 Encounter for fitting and adjustment of urinary device: Secondary | ICD-10-CM | POA: Diagnosis not present

## 2022-03-01 DIAGNOSIS — I10 Essential (primary) hypertension: Secondary | ICD-10-CM | POA: Diagnosis not present

## 2022-03-01 DIAGNOSIS — H409 Unspecified glaucoma: Secondary | ICD-10-CM | POA: Diagnosis not present

## 2022-03-01 DIAGNOSIS — I129 Hypertensive chronic kidney disease with stage 1 through stage 4 chronic kidney disease, or unspecified chronic kidney disease: Secondary | ICD-10-CM | POA: Diagnosis not present

## 2022-03-01 DIAGNOSIS — G4700 Insomnia, unspecified: Secondary | ICD-10-CM | POA: Diagnosis not present

## 2022-03-01 DIAGNOSIS — N183 Chronic kidney disease, stage 3 unspecified: Secondary | ICD-10-CM | POA: Diagnosis not present

## 2022-03-02 DIAGNOSIS — N183 Chronic kidney disease, stage 3 unspecified: Secondary | ICD-10-CM | POA: Diagnosis not present

## 2022-03-02 DIAGNOSIS — G4700 Insomnia, unspecified: Secondary | ICD-10-CM | POA: Diagnosis not present

## 2022-03-02 DIAGNOSIS — Z466 Encounter for fitting and adjustment of urinary device: Secondary | ICD-10-CM | POA: Diagnosis not present

## 2022-03-02 DIAGNOSIS — H409 Unspecified glaucoma: Secondary | ICD-10-CM | POA: Diagnosis not present

## 2022-03-02 DIAGNOSIS — Z87440 Personal history of urinary (tract) infections: Secondary | ICD-10-CM | POA: Diagnosis not present

## 2022-03-02 DIAGNOSIS — I129 Hypertensive chronic kidney disease with stage 1 through stage 4 chronic kidney disease, or unspecified chronic kidney disease: Secondary | ICD-10-CM | POA: Diagnosis not present

## 2022-03-02 DIAGNOSIS — Z9181 History of falling: Secondary | ICD-10-CM | POA: Diagnosis not present

## 2022-03-02 DIAGNOSIS — R338 Other retention of urine: Secondary | ICD-10-CM | POA: Diagnosis not present

## 2022-03-02 DIAGNOSIS — N138 Other obstructive and reflux uropathy: Secondary | ICD-10-CM | POA: Diagnosis not present

## 2022-03-02 DIAGNOSIS — E43 Unspecified severe protein-calorie malnutrition: Secondary | ICD-10-CM | POA: Diagnosis not present

## 2022-03-02 DIAGNOSIS — I10 Essential (primary) hypertension: Secondary | ICD-10-CM | POA: Diagnosis not present

## 2022-03-02 DIAGNOSIS — E7800 Pure hypercholesterolemia, unspecified: Secondary | ICD-10-CM | POA: Diagnosis not present

## 2022-03-09 DIAGNOSIS — H409 Unspecified glaucoma: Secondary | ICD-10-CM | POA: Diagnosis not present

## 2022-03-09 DIAGNOSIS — I129 Hypertensive chronic kidney disease with stage 1 through stage 4 chronic kidney disease, or unspecified chronic kidney disease: Secondary | ICD-10-CM | POA: Diagnosis not present

## 2022-03-09 DIAGNOSIS — G4700 Insomnia, unspecified: Secondary | ICD-10-CM | POA: Diagnosis not present

## 2022-03-09 DIAGNOSIS — E7800 Pure hypercholesterolemia, unspecified: Secondary | ICD-10-CM | POA: Diagnosis not present

## 2022-03-09 DIAGNOSIS — Z87440 Personal history of urinary (tract) infections: Secondary | ICD-10-CM | POA: Diagnosis not present

## 2022-03-09 DIAGNOSIS — I10 Essential (primary) hypertension: Secondary | ICD-10-CM | POA: Diagnosis not present

## 2022-03-09 DIAGNOSIS — Z9181 History of falling: Secondary | ICD-10-CM | POA: Diagnosis not present

## 2022-03-09 DIAGNOSIS — N138 Other obstructive and reflux uropathy: Secondary | ICD-10-CM | POA: Diagnosis not present

## 2022-03-09 DIAGNOSIS — N183 Chronic kidney disease, stage 3 unspecified: Secondary | ICD-10-CM | POA: Diagnosis not present

## 2022-03-09 DIAGNOSIS — Z466 Encounter for fitting and adjustment of urinary device: Secondary | ICD-10-CM | POA: Diagnosis not present

## 2022-03-09 DIAGNOSIS — R338 Other retention of urine: Secondary | ICD-10-CM | POA: Diagnosis not present

## 2022-03-09 DIAGNOSIS — E43 Unspecified severe protein-calorie malnutrition: Secondary | ICD-10-CM | POA: Diagnosis not present

## 2022-03-10 DIAGNOSIS — N138 Other obstructive and reflux uropathy: Secondary | ICD-10-CM | POA: Diagnosis not present

## 2022-03-10 DIAGNOSIS — H409 Unspecified glaucoma: Secondary | ICD-10-CM | POA: Diagnosis not present

## 2022-03-10 DIAGNOSIS — E43 Unspecified severe protein-calorie malnutrition: Secondary | ICD-10-CM | POA: Diagnosis not present

## 2022-03-10 DIAGNOSIS — I10 Essential (primary) hypertension: Secondary | ICD-10-CM | POA: Diagnosis not present

## 2022-03-10 DIAGNOSIS — I129 Hypertensive chronic kidney disease with stage 1 through stage 4 chronic kidney disease, or unspecified chronic kidney disease: Secondary | ICD-10-CM | POA: Diagnosis not present

## 2022-03-10 DIAGNOSIS — G4700 Insomnia, unspecified: Secondary | ICD-10-CM | POA: Diagnosis not present

## 2022-03-10 DIAGNOSIS — E7800 Pure hypercholesterolemia, unspecified: Secondary | ICD-10-CM | POA: Diagnosis not present

## 2022-03-10 DIAGNOSIS — Z466 Encounter for fitting and adjustment of urinary device: Secondary | ICD-10-CM | POA: Diagnosis not present

## 2022-03-10 DIAGNOSIS — N183 Chronic kidney disease, stage 3 unspecified: Secondary | ICD-10-CM | POA: Diagnosis not present

## 2022-03-10 DIAGNOSIS — Z9181 History of falling: Secondary | ICD-10-CM | POA: Diagnosis not present

## 2022-03-10 DIAGNOSIS — Z87440 Personal history of urinary (tract) infections: Secondary | ICD-10-CM | POA: Diagnosis not present

## 2022-03-10 DIAGNOSIS — R338 Other retention of urine: Secondary | ICD-10-CM | POA: Diagnosis not present

## 2022-03-17 DIAGNOSIS — N138 Other obstructive and reflux uropathy: Secondary | ICD-10-CM | POA: Diagnosis not present

## 2022-03-17 DIAGNOSIS — Z466 Encounter for fitting and adjustment of urinary device: Secondary | ICD-10-CM | POA: Diagnosis not present

## 2022-03-17 DIAGNOSIS — R338 Other retention of urine: Secondary | ICD-10-CM | POA: Diagnosis not present

## 2022-03-17 DIAGNOSIS — G4700 Insomnia, unspecified: Secondary | ICD-10-CM | POA: Diagnosis not present

## 2022-03-17 DIAGNOSIS — Z87440 Personal history of urinary (tract) infections: Secondary | ICD-10-CM | POA: Diagnosis not present

## 2022-03-17 DIAGNOSIS — Z9181 History of falling: Secondary | ICD-10-CM | POA: Diagnosis not present

## 2022-03-17 DIAGNOSIS — E7800 Pure hypercholesterolemia, unspecified: Secondary | ICD-10-CM | POA: Diagnosis not present

## 2022-03-17 DIAGNOSIS — N183 Chronic kidney disease, stage 3 unspecified: Secondary | ICD-10-CM | POA: Diagnosis not present

## 2022-03-17 DIAGNOSIS — I10 Essential (primary) hypertension: Secondary | ICD-10-CM | POA: Diagnosis not present

## 2022-03-17 DIAGNOSIS — H409 Unspecified glaucoma: Secondary | ICD-10-CM | POA: Diagnosis not present

## 2022-03-17 DIAGNOSIS — E43 Unspecified severe protein-calorie malnutrition: Secondary | ICD-10-CM | POA: Diagnosis not present

## 2022-03-17 DIAGNOSIS — I129 Hypertensive chronic kidney disease with stage 1 through stage 4 chronic kidney disease, or unspecified chronic kidney disease: Secondary | ICD-10-CM | POA: Diagnosis not present

## 2022-03-18 ENCOUNTER — Other Ambulatory Visit: Payer: Self-pay | Admitting: Family Medicine

## 2022-03-21 NOTE — Telephone Encounter (Signed)
Unable to refill per protocol, Rx request is too soon. Last refill 07/22/21 for 90 days and 3 refills.  Requested Prescriptions  Pending Prescriptions Disp Refills   tamsulosin (FLOMAX) 0.4 MG CAPS capsule [Pharmacy Med Name: Tamsulosin HCl 0.4 MG Oral Capsule] 100 capsule 2    Sig: TAKE 1 CAPSULE BY MOUTH DAILY     Urology: Alpha-Adrenergic Blocker Passed - 03/18/2022 10:46 PM      Passed - PSA in normal range and within 360 days    PSA  Date Value Ref Range Status  07/22/2021 2.85 < OR = 4.00 ng/mL Final    Comment:    The total PSA value from this assay system is  standardized against the WHO standard. The test  result will be approximately 20% lower when compared  to the equimolar-standardized total PSA (Beckman  Coulter). Comparison of serial PSA results should be  interpreted with this fact in mind. . This test was performed using the Siemens  chemiluminescent method. Values obtained from  different assay methods cannot be used interchangeably. PSA levels, regardless of value, should not be interpreted as absolute evidence of the presence or absence of disease.          Passed - Last BP in normal range    BP Readings from Last 1 Encounters:  02/15/22 127/86         Passed - Valid encounter within last 12 months    Recent Outpatient Visits           2 months ago Acute left flank pain   [place], DO   8 months ago Annual physical exam   [facility] Olin Hauser, DO   12 months ago Acute non-recurrent frontal sinusitis   [facility] Olin Hauser, DO   1 year ago Primary insomnia   [facility] Olin Hauser, DO   1 year ago Elevated TSH   [place], Alexander J, DO       Future Appointments             Tomorrow Debroah Loop, [facility], [place], [place]

## 2022-03-22 ENCOUNTER — Ambulatory Visit: Payer: Medicare Other | Admitting: Physician Assistant

## 2022-03-22 VITALS — BP 106/77 | HR 160 | Ht 72.0 in

## 2022-03-22 DIAGNOSIS — N401 Enlarged prostate with lower urinary tract symptoms: Secondary | ICD-10-CM | POA: Diagnosis not present

## 2022-03-22 DIAGNOSIS — R338 Other retention of urine: Secondary | ICD-10-CM

## 2022-03-22 LAB — BLADDER SCAN AMB NON-IMAGING: SCA Result: 210

## 2022-03-22 NOTE — Progress Notes (Signed)
03/22/2022 3:10 PM   Scott Ashley [birth year] [id number]  CC: Chief Complaint  Patient presents with   Follow-up   HPI: Scott Ashley is a 89 y.o. male with PMH BPH with prostatomegaly and urinary retention on Flomax and finasteride as well as small left renal mass who presents today for repeat voiding trial.  He is accompanied today by his granddaughter, who contributes to HPI.  Foley catheter removed in the morning, see separate procedure note for details.  He returned to clinic in the afternoon.  He reports drinking approximately 24 ounces of fluid and urinated a very small volume once.  He states he feels his bladder is full and that he is unable to void.  PVR 210 mL.  Overall, he states that his quality of life remains improved with a Foley catheter in place, especially overnight.  PMH: Past Medical History:  Diagnosis Date   Anxiety    Glaucoma    Hypertension    Urinary incontinence     Surgical History: Past Surgical History:  Procedure Laterality Date   COLONOSCOPY WITH PROPOFOL N/A 10/10/2017   Procedure: COLONOSCOPY WITH PROPOFOL;  Surgeon: Lucilla Lame, MD;  Location: [facility];  Service: Endoscopy;  Laterality: N/A;   ESOPHAGOGASTRODUODENOSCOPY (EGD) WITH PROPOFOL N/A 10/10/2017   Procedure: ESOPHAGOGASTRODUODENOSCOPY (EGD) WITH PROPOFOL;  Surgeon: Lucilla Lame, MD;  Location: [facility]OSCOPY;  Service: Endoscopy;  Laterality: N/A;   ESOPHAGOGASTRODUODENOSCOPY (EGD) WITH PROPOFOL N/A 10/06/2020   Procedure: ESOPHAGOGASTRODUODENOSCOPY (EGD) WITH PROPOFOL;  Surgeon: Lucilla Lame, MD;  Location: [facility] ENDOSCOPY;  Service: Endoscopy;  Laterality: N/A;   HERNIA REPAIR     twice   TONSILLECTOMY      Home Medications:  Allergies as of 03/22/2022       Reactions   Codeine Other (See Comments)        Medication List        Accurate as of March 22, 2022  3:10 PM. If you have any questions, ask your nurse or doctor.          STOP taking these  medications    acetaminophen 500 MG tablet Commonly known as: TYLENOL   melatonin 5 MG Tabs       TAKE these medications    finasteride 5 MG tablet Commonly known as: PROSCAR Take 1 tablet (5 mg total) by mouth daily.   latanoprost 0.005 % ophthalmic solution Commonly known as: XALATAN Place 1 drop into both eyes at bedtime.   multivitamin with minerals Tabs tablet Take 1 tablet by mouth daily.   omeprazole 20 MG capsule Commonly known as: PRILOSEC TAKE 1 CAPSULE BY MOUTH DAILY   PROSTATE PO Take by mouth.   rosuvastatin 5 MG tablet Commonly known as: CRESTOR Take 1 tablet (5 mg total) by mouth 2 (two) times a week.   tamsulosin 0.4 MG Caps capsule Commonly known as: FLOMAX TAKE 1 CAPSULE BY MOUTH DAILY        Allergies:  Allergies  Allergen Reactions   Codeine Other (See Comments)    Family History: Family History  Problem Relation Age of Onset   ALS Mother     Social History:   reports that he has never smoked. He has never been exposed to tobacco smoke. He has never used smokeless tobacco. He reports that he does not drink alcohol and does not use drugs.  Physical Exam: BP 106/77   Pulse (!) 160   Ht 6' (1.829 m)   BMI 20.60 kg/m   Constitutional:  Alert and oriented, no acute distress, nontoxic appearing HEENT: [address], AT Cardiovascular: No clubbing, cyanosis, or edema Respiratory: Normal respiratory effort, no increased work of breathing lymphadenopathy Skin: No rashes, bruises or suspicious lesions Neurologic: Grossly intact, no focal deficits, moving all 4 extremities Psychiatric: Normal mood and affect  Laboratory Data: Results for orders placed or performed in visit on 03/22/22  BLADDER SCAN AMB NON-IMAGING  Result Value Ref Range   SCA Result 210 ml    Simple Catheter Placement  Due to urinary retention patient is present today for a foley cath placement.  Patient was cleaned and prepped in a sterile fashion with betadine and 2%  lidocaine jelly was instilled into the urethra. A 16 FR coud foley catheter was inserted, urine return was noted  264m, urine was yellow in color.  The balloon was filled with 10cc of sterile water.  A leg bag was attached for drainage. Patient tolerated well, no complications were noted   Performed by: SDebroah Loop PA-C   Assessment & Plan:   1. Urinary retention due to benign prostatic hyperplasia Bladder scan equivocal, however he reports some sensations of bladder fullness and the inability to void further.  Together we elected to replace Foley catheter.  We discussed repeating voiding trial next month on finasteride versus continuing Foley catheter in the short-term with plans for monthly catheter exchanges and plans for repeat voiding trial around May 2024, at which point he will have been on finasteride for 6 months.  He elected for the latter. - BLADDER SCAN AMB NON-IMAGING   Return in about 4 weeks (around 04/19/2022) for Catheter exchange.  SDebroah Loop PA-C  [facility] [address] [place] [phone]

## 2022-03-22 NOTE — Progress Notes (Signed)
Catheter Removal  Patient is present today for a catheter removal.  8 ml of water was drained from the balloon. A 18 FR foley cath was removed from the bladder, no complications were noted. Patient tolerated well.  Performed by: Mariam Dollar, CMA  Follow up/ Additional notes: pt will return this afternoon for voiding trial

## 2022-03-23 DIAGNOSIS — M6281 Muscle weakness (generalized): Secondary | ICD-10-CM | POA: Diagnosis not present

## 2022-03-24 ENCOUNTER — Ambulatory Visit: Payer: Medicare Other | Admitting: Physician Assistant

## 2022-04-19 ENCOUNTER — Ambulatory Visit: Payer: Medicare Other | Admitting: Physician Assistant

## 2022-04-19 ENCOUNTER — Encounter: Payer: Self-pay | Admitting: Physician Assistant

## 2022-04-19 DIAGNOSIS — Z466 Encounter for fitting and adjustment of urinary device: Secondary | ICD-10-CM

## 2022-04-19 DIAGNOSIS — R339 Retention of urine, unspecified: Secondary | ICD-10-CM | POA: Diagnosis not present

## 2022-04-19 NOTE — Progress Notes (Signed)
Cath Change/ Replacement  Patient is present today for a catheter change due to urinary retention.  59m of water was removed from the balloon, a 16FR coude foley cath was removed without difficulty.  Patient was cleaned and prepped in a sterile fashion with betadine and 2% lidocaine jelly was instilled into the urethra. A 16 FR coude foley cath was replaced into the bladder, no complications were noted. Urine return was noted 148mand urine was yellow in color. The balloon was filled with 1034mf sterile water. A leg bag was attached for drainage.  Patient tolerated well.    Performed by: SamDebroah LoopA-C   Follow up: Return in about 4 weeks (around 05/17/2022) for Catheter exchange.

## 2022-04-21 DIAGNOSIS — M6281 Muscle weakness (generalized): Secondary | ICD-10-CM | POA: Diagnosis not present

## 2022-05-05 ENCOUNTER — Other Ambulatory Visit: Payer: Self-pay | Admitting: Family Medicine

## 2022-05-05 DIAGNOSIS — I10 Essential (primary) hypertension: Secondary | ICD-10-CM

## 2022-05-05 NOTE — Telephone Encounter (Signed)
Requested Prescriptions  Pending Prescriptions Disp Refills   bisoprolol-hydrochlorothiazide (ZIAC) 5-6.25 MG tablet [Pharmacy Med Name: Bisoprolol-hydroCHLOROthiazide 5-6.25 MG Oral Tablet] 100 tablet 2    Sig: TAKE 1 TABLET BY MOUTH DAILY     Cardiovascular: Beta Blocker + Diuretic Combos Failed - 05/05/2022  5:23 AM      Failed - Last Heart Rate in normal range    Pulse Readings from Last 1 Encounters:  03/22/22 (!) 160         Passed - K in normal range and within 180 days    Potassium  Date Value Ref Range Status  01/20/2022 3.5 3.5 - 5.1 mmol/L Final         Passed - Na in normal range and within 180 days    Sodium  Date Value Ref Range Status  01/20/2022 142 135 - 145 mmol/L Final         Passed - Cr in normal range and within 180 days    Creat  Date Value Ref Range Status  07/22/2021 1.09 0.70 - 1.22 mg/dL Final   Creatinine, Ser  Date Value Ref Range Status  01/20/2022 1.02 0.61 - 1.24 mg/dL Final         Passed - eGFR in normal range and within 180 days    GFR, Est African American  Date Value Ref Range Status  05/22/2020 66 > OR = 60 mL/min/1.60m2 Final   GFR, Est Non African American  Date Value Ref Range Status  05/22/2020 57 (L) > OR = 60 mL/min/1.29m2 Final   GFR, Estimated  Date Value Ref Range Status  01/20/2022 >60 >60 mL/min Final    Comment:    (NOTE) Calculated using the CKD-EPI Creatinine Equation (2021)    eGFR  Date Value Ref Range Status  07/22/2021 64 > OR = 60 mL/min/1.35m2 Final    Comment:    The eGFR is based on the CKD-EPI 2021 equation. To calculate  the new eGFR from a previous Creatinine or Cystatin C result, go to https://www.kidney.org/professionals/ kdoqi/gfr%5Fcalculator          Passed - Last BP in normal range    BP Readings from Last 1 Encounters:  03/22/22 106/77         Passed - Valid encounter within last 6 months    Recent Outpatient Visits           3 months ago Acute left flank pain   [facility] Olin Hauser, DO   9 months ago Annual physical exam   [facility] Olin Hauser, DO   1 year ago Acute non-recurrent frontal sinusitis   [facility] Olin Hauser, DO   1 year ago Primary insomnia   [facility] Olin Hauser, DO   1 year ago Elevated TSH   [facility] Olin Hauser, DO       Future Appointments             In 2 weeks Debroah Loop, [place]

## 2022-05-14 ENCOUNTER — Other Ambulatory Visit: Payer: Self-pay | Admitting: Family Medicine

## 2022-05-16 NOTE — Telephone Encounter (Signed)
Requested Prescriptions  Refused Prescriptions Disp Refills   tamsulosin (FLOMAX) 0.4 MG CAPS capsule [Pharmacy Med Name: Tamsulosin HCl 0.4 MG Oral Capsule] 60 capsule 5    Sig: TAKE 1 CAPSULE BY MOUTH DAILY     Urology: Alpha-Adrenergic Blocker Passed - 05/14/2022 10:48 PM      Passed - PSA in normal range and within 360 days    PSA  Date Value Ref Range Status  07/22/2021 2.85 < OR = 4.00 ng/mL Final    Comment:    The total PSA value from this assay system is  standardized against the WHO standard. The test  result will be approximately 20% lower when compared  to the equimolar-standardized total PSA (Beckman  Coulter). Comparison of serial PSA results should be  interpreted with this fact in mind. . This test was performed using the Siemens  chemiluminescent method. Values obtained from  different assay methods cannot be used interchangeably. PSA levels, regardless of value, should not be interpreted as absolute evidence of the presence or absence of disease.          Passed - Last BP in normal range    BP Readings from Last 1 Encounters:  03/22/22 106/77         Passed - Valid encounter within last 12 months    Recent Outpatient Visits           4 months ago Acute left flank pain   [place] [facility] Smitty Cords, DO   9 months ago Annual physical exam   [place] [facility] Smitty Cords, DO   1 year ago Acute non-recurrent frontal sinusitis   [place] [facility] Smitty Cords, DO   1 year ago Primary insomnia   [place] [facility] Smitty Cords, DO   1 year ago Elevated TSH   [place] [facility] Smitty Cords, DO       Future Appointments             In 4 days Carman Ching, [place] [facility] [place]

## 2022-05-20 ENCOUNTER — Ambulatory Visit: Payer: Medicare Other | Admitting: Physician Assistant

## 2022-05-20 VITALS — BP 119/70 | HR 100

## 2022-05-20 DIAGNOSIS — Z466 Encounter for fitting and adjustment of urinary device: Secondary | ICD-10-CM

## 2022-05-20 DIAGNOSIS — R339 Retention of urine, unspecified: Secondary | ICD-10-CM

## 2022-05-20 NOTE — Progress Notes (Signed)
Cath Change/ Replacement  Patient is present today for a catheter change due to urinary retention.  63ml of water was removed from the balloon, a 16FR coude foley cath was removed without difficulty.  Patient was cleaned and prepped in a sterile fashion with betadine and 2% lidocaine jelly was instilled into the urethra. A 16 FR coude foley cath was replaced into the bladder, no complications were noted. Urine return was noted 16ml and urine was clear in color. The balloon was filled with 26ml of sterile water. A leg bag was attached for drainage.  Patient tolerated well.    Performed by: Carman Ching, PA-C  Additional notes: Patient will be due for repeat VT next month on finasteride. Granddaughter reports malodorous urine consistent with colonization; will plan to give abx prior to Foley removal that day.  Follow up: Return in about 4 weeks (around 06/17/2022) for Voiding trial.

## 2022-06-08 ENCOUNTER — Other Ambulatory Visit: Payer: Self-pay | Admitting: Family Medicine

## 2022-06-08 NOTE — Telephone Encounter (Signed)
Requested medication (s) are due for refill today - unsure  Requested medication (s) are on the active medication list -yes  Future visit scheduled -no  Last refill: unsure  Notes to clinic: off protocol- provider review , historical provider listed   Requested Prescriptions  Pending Prescriptions Disp Refills   BELSOMRA 20 MG TABS [Pharmacy Med Name: BELSOMRA 20MG  TABLETS] 30 tablet     Sig: TAKE 1/2 TO 1 TABLET BY MOUTH AT BEDTIME AS NEEDED     Off-Protocol Failed - 06/08/2022 12:26 PM      Failed - Medication not assigned to a protocol, review manually.      Passed - Valid encounter within last 12 months    Recent Outpatient Visits           5 months ago Acute left flank pain   [place] [facility] [place], Netta Neat, DO   10 months ago Annual physical exam   [place] [facility] Smitty Cords, DO   1 year ago Acute non-recurrent frontal sinusitis   [place] [facility] Smitty Cords, DO   1 year ago Primary insomnia   [facility] Smitty Cords, DO   1 year ago Elevated TSH   [place] [facility] Smitty Cords, DO       Future Appointments             In 1 week Carman Ching, PA-C [facility] [place]   In 1 week Carman Ching, PA-C [facility] [place]               Requested Prescriptions  Pending Prescriptions Disp Refills   BELSOMRA 20 MG TABS [Pharmacy Med Name: BELSOMRA 20MG  TABLETS] 30 tablet     Sig: TAKE 1/2 TO 1 TABLET BY MOUTH AT BEDTIME AS NEEDED     Off-Protocol Failed - 06/08/2022 12:26 PM      Failed - Medication not assigned to a protocol, review manually.      Passed - Valid encounter within last 12 months    Recent Outpatient Visits           5 months ago Acute left flank pain   [place] [facility] [place], Netta Neat, DO    10 months ago Annual physical exam   [place] [facility] Smitty Cords, DO   1 year ago Acute non-recurrent frontal sinusitis   [place] [facility] Smitty Cords, DO   1 year ago Primary insomnia   [place] [facility] Smitty Cords, DO   1 year ago Elevated TSH   [place] [facility] Smitty Cords, DO       Future Appointments             In 1 week Carman Ching, PA-C [facility] [place]   In 1 week Carman Ching, [facility] [place]

## 2022-06-09 ENCOUNTER — Other Ambulatory Visit: Payer: Self-pay

## 2022-06-09 DIAGNOSIS — F5101 Primary insomnia: Secondary | ICD-10-CM

## 2022-06-09 MED ORDER — BELSOMRA 20 MG PO TABS: 0.5000 | ORAL_TABLET | Freq: Every evening | ORAL | 2 refills | Status: DC | PRN

## 2022-06-20 ENCOUNTER — Ambulatory Visit: Payer: Medicare Other | Admitting: Physician Assistant

## 2022-06-22 ENCOUNTER — Ambulatory Visit: Payer: Medicare Other | Admitting: Physician Assistant

## 2022-06-22 ENCOUNTER — Encounter: Payer: Self-pay | Admitting: Physician Assistant

## 2022-06-22 VITALS — BP 129/83 | HR 143 | Ht 72.0 in | Wt 152.0 lb

## 2022-06-22 DIAGNOSIS — N138 Other obstructive and reflux uropathy: Secondary | ICD-10-CM

## 2022-06-22 DIAGNOSIS — N401 Enlarged prostate with lower urinary tract symptoms: Secondary | ICD-10-CM

## 2022-06-22 DIAGNOSIS — R339 Retention of urine, unspecified: Secondary | ICD-10-CM

## 2022-06-22 LAB — BLADDER SCAN AMB NON-IMAGING: Scan Result: 291

## 2022-06-22 NOTE — Progress Notes (Signed)
Simple Catheter Placement  Due to urinary retention patient is present today for a foley cath placement.  Patient was cleaned and prepped in a sterile fashion with betadine and 2% lidocaine jelly was instilled into the urethra. A 16 FR coude foley catheter was inserted, urine return was noted  10 ml, urine was yellow in color.  The balloon was filled with 10cc of sterile water.  A leg bag was attached for drainage.   Patient was given instruction on proper catheter care.  Patient tolerated well, no complications were noted   Performed by: August Luz, RN   Additional notes/ Follow up: Return in about 4 weeks (around 07/20/2022) for Catheter exchange.

## 2022-06-22 NOTE — Progress Notes (Signed)
06/22/2022 1:55 PM   Kandace Parkins 23-Jul-1929 [id number]  CC: Chief Complaint  Patient presents with   Other    Voiding trial   HPI: LATISHA VANLENTEN is a 89 y.o. male with PMH BPH with prostatomegaly and urinary retention on Flomax and finasteride since December 2023 as well as a small left renal mass who presents today for repeat voiding trial after 6 months of finasteride.   Foley catheter removed in clinic in the morning, see separate procedure note for details.  He returned to clinic in the afternoon for PVR.  He has been able to void but reports bladder discomfort and is uncomfortable.  PVR .  PMH: Past Medical History:  Diagnosis Date   Anxiety    Glaucoma    Hypertension    Urinary incontinence     Surgical History: Past Surgical History:  Procedure Laterality Date   COLONOSCOPY WITH PROPOFOL N/A 10/10/2017   Procedure: COLONOSCOPY WITH PROPOFOL;  Surgeon: Midge Minium, MD;  Location: [facility];  Service: Endoscopy;  Laterality: N/A;   ESOPHAGOGASTRODUODENOSCOPY (EGD) WITH PROPOFOL N/A 10/10/2017   Procedure: ESOPHAGOGASTRODUODENOSCOPY (EGD) WITH PROPOFOL;  Surgeon: Midge Minium, MD;  Location: [facility];  Service: Endoscopy;  Laterality: N/A;   ESOPHAGOGASTRODUODENOSCOPY (EGD) WITH PROPOFOL N/A 10/06/2020   Procedure: ESOPHAGOGASTRODUODENOSCOPY (EGD) WITH PROPOFOL;  Surgeon: Midge Minium, MD;  Location: [facility];  Service: Endoscopy;  Laterality: N/A;   HERNIA REPAIR     twice   TONSILLECTOMY      Home Medications:  Allergies as of 06/22/2022       Reactions   Codeine Other (See Comments)        Medication List        Accurate as of Jun 22, 2022  1:55 PM. If you have any questions, ask your nurse or doctor.          Belsomra 20 MG Tabs Generic drug: Suvorexant Take 0.5-1 tablets (10-20 mg total) by mouth at bedtime as needed.   finasteride 5 MG tablet Commonly known as: PROSCAR Take 1 tablet (5 mg total) by mouth daily.    latanoprost 0.005 % ophthalmic solution Commonly known as: XALATAN Place 1 drop into both eyes at bedtime.   multivitamin with minerals Tabs tablet Take 1 tablet by mouth daily.   omeprazole 20 MG capsule Commonly known as: PRILOSEC TAKE 1 CAPSULE BY MOUTH DAILY   PROSTATE PO Take by mouth.   rosuvastatin 5 MG tablet Commonly known as: CRESTOR Take 1 tablet (5 mg total) by mouth 2 (two) times a week.   tamsulosin 0.4 MG Caps capsule Commonly known as: FLOMAX TAKE 1 CAPSULE BY MOUTH DAILY        Allergies:  Allergies  Allergen Reactions   Codeine Other (See Comments)    Family History: Family History  Problem Relation Age of Onset   ALS Mother     Social History:   reports that he has never smoked. He has never been exposed to tobacco smoke. He has never used smokeless tobacco. He reports that he does not drink alcohol and does not use drugs.  Physical Exam: There were no vitals taken for this visit.  Constitutional:  Alert and oriented, no acute distress, nontoxic appearing HEENT: [place], AT Cardiovascular: No clubbing, cyanosis, or edema Respiratory: Normal respiratory effort, no increased work of breathing Skin: No rashes, bruises or suspicious lesions Neurologic: Grossly intact, no focal deficits, moving all 4 extremities Psychiatric: Normal mood and affect  Laboratory Data: Results for  orders placed or performed in visit on 06/22/22  BLADDER SCAN AMB NON-IMAGING  Result Value Ref Range   Scan Result 291    Assessment & Plan:   1. BPH with obstruction/lower urinary tract symptoms Voiding trial failed. I offered him Foley replacement and he accepted, see separate procedure note. He would like to reattempt voiding trial in the future while remaining on finasteride; will plan for August 2024 with monthly Foley changes in the interim. - BLADDER SCAN AMB NON-IMAGING   Return in about 4 weeks (around 07/20/2022) for Catheter exchange.  Carman Ching,  PA-C  [facility] [place] [address], [address] [place], [place] [address] [phone]

## 2022-06-22 NOTE — Progress Notes (Signed)
Catheter Removal  Patient is present today for a catheter removal.  8 ml of water was drained from the balloon. A 16 FR coude foley cath was removed from the bladder, no complications were noted. Patient tolerated well.  Performed by: August Luz, RN   Follow up/ Additional notes: follow up appt 3:30 pm today for PVR.

## 2022-06-30 ENCOUNTER — Other Ambulatory Visit: Payer: Self-pay | Admitting: Family Medicine

## 2022-06-30 DIAGNOSIS — I10 Essential (primary) hypertension: Secondary | ICD-10-CM

## 2022-07-01 NOTE — Telephone Encounter (Signed)
Requested medication (s) are due for refill today: -  Requested medication (s) are on the active medication list: no  Last refill:  07/22/21  Future visit scheduled: yes  Notes to clinic:  this med was stopped at discharge. Do not see a hospital f/u or where med was discussed. Just sending back to make sure it is in fact refused.   Requested Prescriptions  Pending Prescriptions Disp Refills   bisoprolol-hydrochlorothiazide (ZIAC) 5-6.25 MG tablet [Pharmacy Med Name: Bisoprolol-hydroCHLOROthiazide 5-6.25 MG Oral Tablet] 60 tablet 5    Sig: Take 1 tablet by mouth daily.     Cardiovascular: Beta Blocker + Diuretic Combos Failed - 06/30/2022 10:10 PM      Failed - Last Heart Rate in normal range    Pulse Readings from Last 1 Encounters:  06/22/22 (!) 143         Passed - K in normal range and within 180 days    Potassium  Date Value Ref Range Status  01/20/2022 3.5 3.5 - 5.1 mmol/L Final         Passed - Na in normal range and within 180 days    Sodium  Date Value Ref Range Status  01/20/2022 142 135 - 145 mmol/L Final         Passed - Cr in normal range and within 180 days    Creat  Date Value Ref Range Status  07/22/2021 1.09 0.70 - 1.22 mg/dL Final   Creatinine, Ser  Date Value Ref Range Status  01/20/2022 1.02 0.61 - 1.24 mg/dL Final         Passed - eGFR in normal range and within 180 days    GFR, Est African American  Date Value Ref Range Status  05/22/2020 66 > OR = 60 mL/min/1.15m2 Final   GFR, Est Non African American  Date Value Ref Range Status  05/22/2020 57 (L) > OR = 60 mL/min/1.60m2 Final   GFR, Estimated  Date Value Ref Range Status  01/20/2022 >60 >60 mL/min Final    Comment:    (NOTE) Calculated using the CKD-EPI Creatinine Equation (2021)    eGFR  Date Value Ref Range Status  07/22/2021 64 > OR = 60 mL/min/1.58m2 Final    Comment:    The eGFR is based on the CKD-EPI 2021 equation. To calculate  the new eGFR from a previous Creatinine or  Cystatin C result, go to https://www.kidney.org/professionals/ kdoqi/gfr%5Fcalculator          Passed - Last BP in normal range    BP Readings from Last 1 Encounters:  06/22/22 129/83         Passed - Valid encounter within last 6 months    Recent Outpatient Visits           5 months ago Acute left flank pain   [place] [facility] Smitty Cords, DO   11 months ago Annual physical exam   [place] [facility] Smitty C[facility], DO   1 year ago Acute non-recurrent frontal sinusitis   [place] [facility] Smitty Cords, DO   1 year ago Primary insomnia   [place] [facility] Smitty Cords, DO   1 year ago Elevated TSH   [place] [facility] Smitty Cords, DO       Future Appointments             In 3 weeks Carman Ching, [place] [facility] [place]   In 3 weeks Althea Charon, Netta Neat, DO [place] [facility], [place]

## 2022-07-25 ENCOUNTER — Ambulatory Visit: Payer: Medicare Other | Admitting: Physician Assistant

## 2022-07-25 DIAGNOSIS — R102 Pelvic and perineal pain: Secondary | ICD-10-CM | POA: Diagnosis not present

## 2022-07-25 DIAGNOSIS — Z466 Encounter for fitting and adjustment of urinary device: Secondary | ICD-10-CM | POA: Diagnosis not present

## 2022-07-25 DIAGNOSIS — R310 Gross hematuria: Secondary | ICD-10-CM

## 2022-07-25 DIAGNOSIS — R339 Retention of urine, unspecified: Secondary | ICD-10-CM

## 2022-07-25 MED ORDER — SULFAMETHOXAZOLE-TRIMETHOPRIM 800-160 MG PO TABS: 1.0000 | ORAL_TABLET | Freq: Two times a day (BID) | ORAL | 0 refills | Status: AC

## 2022-07-25 NOTE — Progress Notes (Signed)
Cath Change/ Replacement  Patient is present today for a catheter change due to urinary retention.  8ml of water was removed from the balloon, a 16FR coude foley cath was removed without difficulty.  Patient was cleaned and prepped in a sterile fashion with betadine and 2% lidocaine jelly was instilled into the urethra. A 16 FR coude foley cath was replaced into the bladder, no complications were noted. Urine return was noted 3ml and urine was yellow in color. The balloon was filled with 10ml of sterile water. A leg bag was attached for drainage.  Patient tolerated well.    Performed by: Carman Ching, PA-C   Additional notes: They report 2 days of suprapubic pain and intermittent gross hematuria this month. No fever, chills, nausea, or vomiting. Urine specimen obtained from new Foley today for culture; will start empiric Bactrim DS BID x7 days.  Follow up: Return in about 4 weeks (around 08/22/2022) for Catheter exchange.

## 2022-07-28 ENCOUNTER — Ambulatory Visit (INDEPENDENT_AMBULATORY_CARE_PROVIDER_SITE_OTHER): Payer: Medicare Other | Admitting: Family Medicine

## 2022-07-28 ENCOUNTER — Encounter: Payer: Self-pay | Admitting: Family Medicine

## 2022-07-28 VITALS — BP 120/80 | HR 65 | Ht 72.0 in | Wt 148.0 lb

## 2022-07-28 DIAGNOSIS — F5101 Primary insomnia: Secondary | ICD-10-CM | POA: Diagnosis not present

## 2022-07-28 DIAGNOSIS — K929 Disease of digestive system, unspecified: Secondary | ICD-10-CM

## 2022-07-28 DIAGNOSIS — I129 Hypertensive chronic kidney disease with stage 1 through stage 4 chronic kidney disease, or unspecified chronic kidney disease: Secondary | ICD-10-CM

## 2022-07-28 DIAGNOSIS — R7309 Other abnormal glucose: Secondary | ICD-10-CM | POA: Diagnosis not present

## 2022-07-28 DIAGNOSIS — N138 Other obstructive and reflux uropathy: Secondary | ICD-10-CM

## 2022-07-28 DIAGNOSIS — N183 Chronic kidney disease, stage 3 unspecified: Secondary | ICD-10-CM

## 2022-07-28 DIAGNOSIS — I10 Essential (primary) hypertension: Secondary | ICD-10-CM | POA: Diagnosis not present

## 2022-07-28 DIAGNOSIS — N401 Enlarged prostate with lower urinary tract symptoms: Secondary | ICD-10-CM

## 2022-07-28 DIAGNOSIS — Z0000 Encounter for general adult medical examination without abnormal findings: Secondary | ICD-10-CM

## 2022-07-28 DIAGNOSIS — E43 Unspecified severe protein-calorie malnutrition: Secondary | ICD-10-CM

## 2022-07-28 DIAGNOSIS — E7800 Pure hypercholesterolemia, unspecified: Secondary | ICD-10-CM

## 2022-07-28 MED ORDER — TAMSULOSIN HCL 0.4 MG PO CAPS: 0.4000 mg | ORAL_CAPSULE | Freq: Every day | ORAL | 3 refills | Status: DC

## 2022-07-28 MED ORDER — BELSOMRA 20 MG PO TABS: 0.5000 | ORAL_TABLET | Freq: Every evening | ORAL | 2 refills | Status: DC | PRN

## 2022-07-28 MED ORDER — ROSUVASTATIN CALCIUM 5 MG PO TABS: 5.0000 mg | ORAL_TABLET | ORAL | 3 refills | Status: DC

## 2022-07-28 NOTE — Progress Notes (Signed)
Subjective:    Patient ID: Scott Ashley, male    DOB: [birth year], 89 y.o.   MRN: [id number]  Scott Ashley is a 89 y.o. male presenting on 07/28/2022 for Annual Exam   HPI  Here for Annual Physical and Fasting Labs.   Here with granddaughter Juliane Poot  Hunger pains, improved w/ eating. Not eating as much Admits inc gas bloating. Tries Gas-X occasionally On omeprazole 20mg  daily Episodic dizziness, fairly rare   Insomnia Reports chronic problem, with some difficulty falling asleep at times. He can occasionally sleep in late and then has difficulty falling asleep at night sometimes. - Previous medication course with Mirtazapine 7.5 to 15, Trazodone, Ambien   Now taking Belsomra 20mg  nightly for sleep, better results. Previously he was doing well on 15mg  (half and quarter) but now stomach bothering him keeping him awake   Weight Loss / Reduced Appetite Reduced sweets, overall good appetite, but not eating as much usually. Often reduced portions. He does eat peanut butter and higher calorie/protein foods when able.   CHRONIC HTN with CKD-III / History of Left Renal Mass Labs, creatinine stable. Reports no new concerns. Checking BP regularly. He is drinking plenty of water. He does not take NSAIDs. Takes Tylenol PRN. He is followed by Dr Richardo Hanks at [facility] for L Renal Mass upcoming visit. Current Meds -OFF medication Reports good compliance, took meds today. Tolerating well, w/o complaints. Denies CP, dyspnea, HA, edema, dizziness / lightheadedness   History of H Pylori Gastric / Abnormal bowel habits - He was on dual antibiotic therapy, sensitive stomach from antibiotics. Has improved - S/p Colonoscopy 10/2017 [facility]   HYPERLIPIDEMIA: Had possible myalgia reaction on STATIN. Had stopped then restarted again. Taking Rosuvastatin 5mg  TWICE a week only Lipid improved still controlled on intermittent statin. No more myalgia or side effects Off ASA   Constipation GERD  improved. On PPI 20mg  daily Occaisonal abdominal pain - usually after eats Taking Stool softener, daily, but not always effective Has miralax, not taking.   BPH LUTS / Nocturia Followed by Urology [facility] Dr Richardo Hanks, next apt 06/10/20 He is taking OTC Prostagenix and Tamsulosin 0.4mg  daily - good results overall. Occasional nocturia still maybe 1-3 x, overall improved.  Recently had catheter change and antibiotics after urinalysis culture pending.        07/28/2022    8:42 AM 07/22/2021    8:43 AM 03/26/2021    8:38 AM  Depression screen PHQ 2/9  Decreased Interest 0 0 0  Down, Depressed, Hopeless 0 0 0  PHQ - 2 Score 0 0 0  Altered sleeping   0  Tired, decreased energy   0  Change in appetite   0  Feeling bad or failure about yourself    0  Trouble concentrating   0  Moving slowly or fidgety/restless   0  Suicidal thoughts   0  PHQ-9 Score   0  Difficult doing work/chores   Not difficult at all    Past Medical History:  Diagnosis Date   Anxiety    Glaucoma    Hypertension    Urinary incontinence    Past Surgical History:  Procedure Laterality Date   COLONOSCOPY WITH PROPOFOL N/A 10/10/2017   Procedure: COLONOSCOPY WITH PROPOFOL;  Surgeon: Midge Minium, MD;  Location[facility] ENDOSCOPY;  Service: Endoscopy;  Laterality: N/A;   ESOPHAGOGASTRODUODENOSCOPY (EGD) WITH PROPOFOL N/A 10/10/2017   Procedure: ESOPHAGOGASTRODUODENOSCOPY (EGD) WITH PROPOFOL;  Surgeon: Midge Minium, MD;  Location: [facility] ENDOSCOPY;  Service: Endoscopy;  Laterality: N/A;   ESOPHAGOGASTRODUODENOSCOPY (EGD) WITH PROPOFOL N/A 10/06/2020   Procedure: ESOPHAGOGASTRODUODENOSCOPY (EGD) WITH PROPOFOL;  Surgeon: Midge Minium, MD;  Location: [facility] ENDOSCOPY;  Service: Endoscopy;  Laterality: N/A;   HERNIA REPAIR     twice   TONSILLECTOMY     Social History   Socioeconomic History   Marital status: Widowed    Spouse name: Not on file   Number of children: Not on file   Years of education: High School   Highest  education level: High school graduate  Occupational History   Occupation: retired  Tobacco Use   Smoking status: Never    Passive exposure: Never   Smokeless tobacco: Never  Vaping Use   Vaping Use: Never used  Substance and Sexual Activity   Alcohol use: Never   Drug use: Never   Sexual activity: Not Currently    Birth control/protection: None  Other Topics Concern   Not on file  Social History Narrative   Not on file   Social Determinants of Health   Financial Resource Strain: Low Risk  (07/22/2021)   Overall Financial Resource Strain (CARDIA)    Difficulty of Paying Living Expenses: Not hard at all  Food Insecurity: No Food Insecurity (01/17/2022)   Hunger Vital Sign    Worried About Running Out of Food in the Last Year: Never true    Ran Out of Food in the Last Year: Never true  Transportation Needs: No Transportation Needs (01/17/2022)   PRAPARE - Administrator, Civil Service (Medical): No    Lack of Transportation (Non-Medical): No  Physical Activity: Inactive (06/09/2020)   Exercise Vital Sign    Days of Exercise per Week: 0 days    Minutes of Exercise per Session: 0 min  Stress: No Stress Concern Present (06/09/2020)   [employer] of Occupational Health - Occupational Stress Questionnaire    Feeling of Stress : Not at all  Social Connections: Socially Isolated (07/22/2021)   Social Connection and Isolation Panel [NHANES]    Frequency of Communication with Friends and Family: More than three times a week    Frequency of Social Gatherings with Friends and Family: More than three times a week    Attends Religious Services: Never    Database administrator or Organizations: No    Attends Banker Meetings: Never    Marital Status: Widowed  Intimate Partner Violence: Not At Risk (01/17/2022)   Humiliation, Afraid, Rape, and Kick questionnaire    Fear of Current or Ex-Partner: No    Emotionally Abused: No    Physically Abused: No     Sexually Abused: No   Family History  Problem Relation Age of Onset   ALS Mother    Current Outpatient Medications on File Prior to Visit  Medication Sig   finasteride (PROSCAR) 5 MG tablet Take 1 tablet (5 mg total) by mouth daily.   latanoprost (XALATAN) 0.005 % ophthalmic solution Place 1 drop into both eyes at bedtime.   Multiple Vitamin (MULTIVITAMIN WITH MINERALS) TABS tablet Take 1 tablet by mouth daily.   omeprazole (PRILOSEC) 20 MG capsule TAKE 1 CAPSULE BY MOUTH DAILY   Specialty Vitamins Products (PROSTATE PO) Take by mouth.   sulfamethoxazole-trimethoprim (BACTRIM DS) 800-160 MG tablet Take 1 tablet by mouth 2 (two) times daily for 7 days.   No current facility-administered medications on file prior to visit.    Review of Systems  Constitutional:  Negative for activity change, appetite  change, chills, diaphoresis, fatigue and fever.  HENT:  Negative for congestion and hearing loss.   Eyes:  Negative for visual disturbance.  Respiratory:  Negative for cough, chest tightness, shortness of breath and wheezing.   Cardiovascular:  Negative for chest pain, palpitations and leg swelling.  Gastrointestinal:  Negative for abdominal pain, constipation, diarrhea, nausea and vomiting.  Genitourinary:  Negative for dysuria, frequency and hematuria.  Musculoskeletal:  Negative for arthralgias and neck pain.  Skin:  Negative for rash.  Neurological:  Negative for dizziness, weakness, light-headedness, numbness and headaches.  Hematological:  Negative for adenopathy.  Psychiatric/Behavioral:  Negative for behavioral problems, dysphoric mood and sleep disturbance.    Per HPI unless specifically indicated above      Objective:    BP 120/80   Pulse 65   Ht 6' (1.829 m)   Wt 148 lb (67.1 kg)   SpO2 98%   BMI 20.07 kg/m   Wt Readings from Last 3 Encounters:  07/28/22 148 lb (67.1 kg)  06/22/22 152 lb (68.9 kg)  01/17/22 151 lb 14.4 oz (68.9 kg)    Physical Exam Vitals and  nursing note reviewed.  Constitutional:      General: He is not in acute distress.    Appearance: He is well-developed. He is not diaphoretic.     Comments: Elderly male, 5 yr chronically ill but well appearing, comfortable, cooperative  HENT:     Head: Normocephalic and atraumatic.  Eyes:     General:        Right eye: No discharge.        Left eye: No discharge.     Conjunctiva/sclera: Conjunctivae normal.     Pupils: Pupils are equal, round, and reactive to light.  Neck:     Thyroid: No thyromegaly.  Cardiovascular:     Rate and Rhythm: Normal rate and regular rhythm.     Pulses: Normal pulses.     Heart sounds: Normal heart sounds. No murmur heard. Pulmonary:     Effort: Pulmonary effort is normal. No respiratory distress.     Breath sounds: Normal breath sounds. No wheezing or rales.  Abdominal:     General: Bowel sounds are normal. There is no distension.     Palpations: Abdomen is soft. There is no mass.     Tenderness: There is no abdominal tenderness.  Musculoskeletal:        General: No tenderness. Normal range of motion.     Cervical back: Normal range of motion and neck supple.     Comments: Upper / Lower Extremities: - Normal muscle tone, strength bilateral upper extremities 5/5, lower extremities 5/5  Lymphadenopathy:     Cervical: No cervical adenopathy.  Skin:    General: Skin is warm and dry.     Findings: No erythema or rash.  Neurological:     Mental Status: He is alert and oriented to person, place, and time. Mental status is at baseline.     Comments: Distal sensation intact to light touch all extremities  Psychiatric:        Mood and Affect: Mood normal.        Behavior: Behavior normal.        Thought Content: Thought content normal.     Comments: Well groomed, good eye contact, normal speech and thoughts       Results for orders placed or performed in visit on 07/28/22  COMPLETE METABOLIC PANEL WITH GFR  Result Value Ref Range   Glucose,  Bld 100 (H) 65 - 99 mg/dL   BUN 18 7 - 25 mg/dL   Creat 4.09 (H) 8.11 - 1.22 mg/dL   eGFR 55 (L) > OR = 60 mL/min/1.5m2   BUN/Creatinine Ratio 15 6 - 22 (calc)   Sodium 139 135 - 146 mmol/L   Potassium 4.3 3.5 - 5.3 mmol/L   Chloride 103 98 - 110 mmol/L   CO2 27 20 - 32 mmol/L   Calcium 9.9 8.6 - 10.3 mg/dL   Total Protein 6.9 6.1 - 8.1 g/dL   Albumin 4.0 3.6 - 5.1 g/dL   Globulin 2.9 1.9 - 3.7 g/dL (calc)   AG Ratio 1.4 1.0 - 2.5 (calc)   Total Bilirubin 0.6 0.2 - 1.2 mg/dL   Alkaline phosphatase (APISO) 88 35 - 144 U/L   AST 14 10 - 35 U/L   ALT 10 9 - 46 U/L  CBC with Differential/Platelet  Result Value Ref Range   WBC 5.9 3.8 - 10.8 Thousand/uL   RBC 5.18 4.20 - 5.80 Million/uL   Hemoglobin 14.0 13.2 - 17.1 g/dL   HCT 91.4 78.2 - 95.6 %   MCV 83.6 80.0 - 100.0 fL   MCH 27.0 27.0 - 33.0 pg   MCHC 32.3 32.0 - 36.0 g/dL   RDW 21.3 08.6 - 57.8 %   Platelets 121 (L) 140 - 400 Thousand/uL   MPV 12.8 (H) 7.5 - 12.5 fL   Neutro Abs 3,670 1,500 - 7,800 cells/uL   Lymphs Abs 1,292 850 - 3,900 cells/uL   Absolute Monocytes 596 200 - 950 cells/uL   Eosinophils Absolute 301 15 - 500 cells/uL   Basophils Absolute 41 0 - 200 cells/uL   Neutrophils Relative % 62.2 %   Total Lymphocyte 21.9 %   Monocytes Relative 10.1 %   Eosinophils Relative 5.1 %   Basophils Relative 0.7 %  Lipid panel  Result Value Ref Range   Cholesterol 146 <200 mg/dL   HDL 54 > OR = 40 mg/dL   Triglycerides 75 <469 mg/dL   LDL Cholesterol (Calc) 76 mg/dL (calc)   Total CHOL/HDL Ratio 2.7 <5.0 (calc)   Non-HDL Cholesterol (Calc) 92 <629 mg/dL (calc)  Hemoglobin B2W  Result Value Ref Range   Hgb A1c MFr Bld 5.7 (H) <5.7 % of total Hgb   Mean Plasma Glucose 117 mg/dL   eAG (mmol/L) 6.5 mmol/L      Assessment & Plan:   Problem List Items Addressed This Visit     Benign hypertension with CKD (chronic kidney disease) stage III (HCC)    Well-controlled HTN Complication with CKD-III, mild improved Cr on  lab previously  Non fasting lab today  Plan:  1. OFF medication 2. Encourage improved lifestyle - low sodium diet, regular exercise 3. Continue monitor BP outside office, bring readings to next visit, if persistently >140/90 or new symptoms notify office sooner      Relevant Medications   rosuvastatin (CRESTOR) 5 MG tablet   Other Relevant Orders   COMPLETE METABOLIC PANEL WITH GFR (Completed)   CBC with Differential/Platelet (Completed)   BPH with obstruction/lower urinary tract symptoms    Controlled Followed by [facility] Dr Richardo Hanks On Flomax 0.4mg  daily and prostate supplement OTC      Relevant Medications   tamsulosin (FLOMAX) 0.4 MG CAPS capsule   Other Relevant Orders   PSA   Insomnia   Relevant Medications   BELSOMRA 20 MG TABS   Protein-calorie malnutrition, severe   Pure hypercholesterolemia  Check lab Previously Controlled cholesterol on lifestyle and intermittent statin now Myalgia on regular dosing statin Rosvau 5-10mg  OFF ASA 81 due to bruising bleeding  Plan: 1. Continue intermittent STATIN - Rosuvastatin 5mg  twice a week Encourage improved lifestyle - low carb/cholesterol, reduce portion size, continue improving regular exercise      Relevant Medications   rosuvastatin (CRESTOR) 5 MG tablet   Other Relevant Orders   COMPLETE METABOLIC PANEL WITH GFR (Completed)   Lipid panel (Completed)   TSH   Other Visit Diagnoses     Annual physical exam    -  Primary   Relevant Orders   COMPLETE METABOLIC PANEL WITH GFR (Completed)   CBC with Differential/Platelet (Completed)   Lipid panel (Completed)   Hemoglobin A1c (Completed)   PSA   TSH   Essential hypertension       Relevant Medications   rosuvastatin (CRESTOR) 5 MG tablet   Functional digestive disorder       Abnormal glucose       Relevant Orders   Hemoglobin A1c (Completed)       Updated Health Maintenance information Reviewed recent lab results with patient Encouraged improvement to  lifestyle with diet and exercise Goal of weight loss  Suspect poor appetite at times and reduced caloric intake contributing to hunger symptoms / abdominal discomfort  Add extra Ensure dose per day if possible. IBGard OTC Peppermint Oil (Triple Coated Capsule) 180mg  take one 3 times daily to reduce abdominal discomfort symptoms, then can reduce to once daily. May also reconsider double dose Omeprazole 20mg  x 2 = 40mg  daily if needed.  All Meds re ordered   Meds ordered this encounter  Medications   BELSOMRA 20 MG TABS    Sig: Take 0.5-1 tablets (10-20 mg total) by mouth at bedtime as needed.    Dispense:  30 tablet    Refill:  2   tamsulosin (FLOMAX) 0.4 MG CAPS capsule    Sig: Take 1 capsule (0.4 mg total) by mouth daily.    Dispense:  90 capsule    Refill:  3    Requesting 1 year supply   rosuvastatin (CRESTOR) 5 MG tablet    Sig: Take 1 tablet (5 mg total) by mouth 2 (two) times a week.    Dispense:  45 tablet    Refill:  3     Follow up plan: Return in about 3 months (around 10/28/2022) for 3 month follow-up GI functional / digestive, Insomnia.  Saralyn Pilar, DO [facility] 07/28/2022, 8:55 AM

## 2022-07-28 NOTE — Patient Instructions (Addendum)
Thank you for coming to the office today.  Meds re ordered  Add extra Ensure dose per day if possible.   IBGard OTC Peppermint Oil (Triple Coated Capsule) 180mg  take one 3 times daily to reduce abdominal discomfort symptoms, then can reduce to once daily.  May also reconsider double dose Omeprazole 20mg  x 2 = 40mg  daily if needed.  1. You have symptoms of Vertigo (Benign Paroxysmal Positional Vertigo) - This is commonly caused by inner ear fluid imbalance, sometimes can be worsened by allergies and sinus symptoms, otherwise it can occur randomly sometimes and we may never discover the exact cause. - To treat this, try the Epley Manuever (see diagrams/instructions below) at home up to 3 times a day for 1-2 weeks or until symptoms resolve   See the next page for images describing the Epley Manuever.     ----------------------------------------------------------------------------------------------------------------------         Please schedule a Follow-up Appointment to: Return in about 3 months (around 10/28/2022) for 3 month follow-up GI functional / digestive, Insomnia.  If you have any other questions or concerns, please feel free to call the office or send a message through MyChart. You may also schedule an earlier appointment if necessary.  Additionally, you may be receiving a survey about your experience at our office within a few days to 1 week by e-mail or mail. We value your feedback.  Saralyn Pilar, DO [facility], [place]

## 2022-07-29 LAB — COMPLETE METABOLIC PANEL WITH GFR
AG Ratio: 1.4 (calc) (ref 1.0–2.5)
ALT: 10 U/L (ref 9–46)
AST: 14 U/L (ref 10–35)
Albumin: 4 g/dL (ref 3.6–5.1)
Alkaline phosphatase (APISO): 88 U/L (ref 35–144)
BUN/Creatinine Ratio: 15 (calc) (ref 6–22)
BUN: 18 mg/dL (ref 7–25)
CO2: 27 mmol/L (ref 20–32)
Calcium: 9.9 mg/dL (ref 8.6–10.3)
Chloride: 103 mmol/L (ref 98–110)
Creat: 1.23 mg/dL — ABNORMAL HIGH (ref 0.70–1.22)
Globulin: 2.9 g/dL (calc) (ref 1.9–3.7)
Glucose, Bld: 100 mg/dL — ABNORMAL HIGH (ref 65–99)
Potassium: 4.3 mmol/L (ref 3.5–5.3)
Sodium: 139 mmol/L (ref 135–146)
Total Bilirubin: 0.6 mg/dL (ref 0.2–1.2)
Total Protein: 6.9 g/dL (ref 6.1–8.1)
eGFR: 55 mL/min/{1.73_m2} — ABNORMAL LOW (ref >=60)

## 2022-07-29 LAB — LIPID PANEL
Cholesterol: 146 mg/dL (ref <200)
HDL: 54 mg/dL (ref >=40)
LDL Cholesterol (Calc): 76 mg/dL (calc)
Non-HDL Cholesterol (Calc): 92 mg/dL (calc) (ref <130)
Total CHOL/HDL Ratio: 2.7 (calc) (ref <5.0)
Triglycerides: 75 mg/dL (ref <150)

## 2022-07-29 LAB — HEMOGLOBIN A1C
Hgb A1c MFr Bld: 5.7 % of total Hgb — ABNORMAL HIGH (ref <5.7)
Mean Plasma Glucose: 117 mg/dL
eAG (mmol/L): 6.5 mmol/L

## 2022-07-29 LAB — CBC WITH DIFFERENTIAL/PLATELET
Absolute Monocytes: 596 cells/uL (ref 200–950)
Basophils Absolute: 41 cells/uL (ref 0–200)
Basophils Relative: 0.7 %
Eosinophils Absolute: 301 cells/uL (ref 15–500)
Eosinophils Relative: 5.1 %
HCT: 43.3 % (ref 38.5–50.0)
Hemoglobin: 14 g/dL (ref 13.2–17.1)
Lymphs Abs: 1292 cells/uL (ref 850–3900)
MCH: 27 pg (ref 27.0–33.0)
MCHC: 32.3 g/dL (ref 32.0–36.0)
MCV: 83.6 fL (ref 80.0–100.0)
MPV: 12.8 fL — ABNORMAL HIGH (ref 7.5–12.5)
Monocytes Relative: 10.1 %
Neutro Abs: 3670 cells/uL (ref 1500–7800)
Neutrophils Relative %: 62.2 %
Platelets: 121 10*3/uL — ABNORMAL LOW (ref 140–400)
RBC: 5.18 10*6/uL (ref 4.20–5.80)
RDW: 14 % (ref 11.0–15.0)
Total Lymphocyte: 21.9 %
WBC: 5.9 10*3/uL (ref 3.8–10.8)

## 2022-07-29 LAB — PSA: PSA: 2.69 ng/mL (ref <=4.00)

## 2022-07-29 LAB — TSH: TSH: 4.56 mIU/L — ABNORMAL HIGH (ref 0.40–4.50)

## 2022-07-29 NOTE — Assessment & Plan Note (Signed)
Check lab Previously Controlled cholesterol on lifestyle and intermittent statin now Myalgia on regular dosing statin Rosvau 5-10mg  OFF ASA 81 due to bruising bleeding  Plan: 1. Continue intermittent STATIN - Rosuvastatin 5mg  twice a week Encourage improved lifestyle - low carb/cholesterol, reduce portion size, continue improving regular exercise

## 2022-07-29 NOTE — Assessment & Plan Note (Signed)
Controlled Followed by [facility] Dr Richardo Hanks On Flomax 0.4mg  daily and prostate supplement OTC

## 2022-07-29 NOTE — Assessment & Plan Note (Addendum)
Well-controlled HTN Complication with CKD-III, mild improved Cr on lab previously  Non fasting lab today  Plan:  1. OFF medication 2. Encourage improved lifestyle - low sodium diet, regular exercise 3. Continue monitor BP outside office, bring readings to next visit, if persistently >140/90 or new symptoms notify office sooner

## 2022-07-30 LAB — CULTURE, URINE COMPREHENSIVE

## 2022-08-07 ENCOUNTER — Other Ambulatory Visit: Payer: Self-pay | Admitting: Family Medicine

## 2022-08-09 NOTE — Telephone Encounter (Signed)
Requested Prescriptions  Pending Prescriptions Disp Refills   omeprazole (PRILOSEC) 20 MG capsule [Pharmacy Med Name: Omeprazole 20 MG Oral Capsule Delayed Release] 100 capsule 3    Sig: TAKE 1 CAPSULE BY MOUTH DAILY     Gastroenterology: Proton Pump Inhibitors Passed - 08/07/2022 10:49 PM      Passed - Valid encounter within last 12 months    Recent Outpatient Visits           1 week ago Annual physical exam   [place] [facility] Smitty Cords, DO   7 months ago Acute left flank pain   [place] [facility] Smitty Cords, DO   1 year ago Annual physical exam   [place] [facility] Smitty Cords, DO   1 year ago Acute non-recurrent frontal sinusitis   [place] [facility] Smitty Cords, DO   1 year ago Primary insomnia   [place] [facility] [place], Netta Neat, DO       Future Appointments             In 2 weeks Carman Ching, PA-C [facility] [place]   In 2 months Althea Charon, Netta Neat, DO [place] [facility]

## 2022-08-18 ENCOUNTER — Ambulatory Visit (INDEPENDENT_AMBULATORY_CARE_PROVIDER_SITE_OTHER): Payer: Medicare Other

## 2022-08-18 VITALS — Ht 72.0 in | Wt 148.0 lb

## 2022-08-18 DIAGNOSIS — Z0000 Encounter for general adult medical examination without abnormal findings: Secondary | ICD-10-CM

## 2022-08-18 NOTE — Patient Instructions (Signed)
Scott Ashley , Thank you for taking time to come for your Medicare Wellness Visit. I appreciate your ongoing commitment to your health goals. Please review the following plan we discussed and let me know if I can assist you in the future.   These are the goals we discussed:  Goals      DIET - EAT MORE FRUITS AND VEGETABLES     Patient Stated     06/09/2020, no goals        This is a list of the screening recommended for you and due dates:  Health Maintenance  Topic Date Due   COVID-19 Vaccine (1 - 2023-24 season) Never done   Zoster (Shingles) Vaccine (1 of 2) 10/28/2022*   Pneumonia Vaccine (1 of 1 - PCV) 07/27/2024*   Flu Shot  09/08/2022   Medicare Annual Wellness Visit  08/18/2023   HPV Vaccine  Aged Out   DTaP/Tdap/Td vaccine  Discontinued  *Topic was postponed. The date shown is not the original due date.    Advanced directives: no  Conditions/risks identified: none  Next appointment: Follow up in one year for your annual wellness visit. 08/24/23 @ 1:30 pm by phone  Preventive Care 65 Years and Older, Male  Preventive care refers to lifestyle choices and visits with your health care provider that can promote health and wellness. What does preventive care include? A yearly physical exam. This is also called an annual well check. Dental exams once or twice a year. Routine eye exams. Ask your health care provider how often you should have your eyes checked. Personal lifestyle choices, including: Daily care of your teeth and gums. Regular physical activity. Eating a healthy diet. Avoiding tobacco and drug use. Limiting alcohol use. Practicing safe sex. Taking low doses of aspirin every day. Taking vitamin and mineral supplements as recommended by your health care provider. What happens during an annual well check? The services and screenings done by your health care provider during your annual well check will depend on your age, overall health, lifestyle risk factors, and  family history of disease. Counseling  Your health care provider may ask you questions about your: Alcohol use. Tobacco use. Drug use. Emotional well-being. Home and relationship well-being. Sexual activity. Eating habits. History of falls. Memory and ability to understand (cognition). Work and work Astronomer. Screening  You may have the following tests or measurements: Height, weight, and BMI. Blood pressure. Lipid and cholesterol levels. These may be checked every 5 years, or more frequently if you are over 22 years old. Skin check. Lung cancer screening. You may have this screening every year starting at age 87 if you have a 30-pack-year history of smoking and currently smoke or have quit within the past 15 years. Fecal occult blood test (FOBT) of the stool. You may have this test every year starting at age 75. Flexible sigmoidoscopy or colonoscopy. You may have a sigmoidoscopy every 5 years or a colonoscopy every 10 years starting at age 870. Prostate cancer screening. Recommendations will vary depending on your family history and other risks. Hepatitis C blood test. Hepatitis B blood test. Sexually transmitted disease (STD) testing. Diabetes screening. This is done by checking your blood sugar (glucose) after you have not eaten for a while (fasting). You may have this done every 1-3 years. Abdominal aortic aneurysm (AAA) screening. You may need this if you are a current or former smoker. Osteoporosis. You may be screened starting at age 87 if you are at high risk. Talk with  your health care provider about your test results, treatment options, and if necessary, the need for more tests. Vaccines  Your health care provider may recommend certain vaccines, such as: Influenza vaccine. This is recommended every year. Tetanus, diphtheria, and acellular pertussis (Tdap, Td) vaccine. You may need a Td booster every 10 years. Zoster vaccine. You may need this after age 87. Pneumococcal  13-valent conjugate (PCV13) vaccine. One dose is recommended after age 87. Pneumococcal polysaccharide (PPSV23) vaccine. One dose is recommended after age 51. Talk to your health care provider about which screenings and vaccines you need and how often you need them. This information is not intended to replace advice given to you by your health care provider. Make sure you discuss any questions you have with your health care provider. Document Released: 02/20/2015 Document Revised: 10/14/2015 Document Reviewed: 11/25/2014 Elsevier Interactive Patient Education  2017 [employer].  Fall Prevention in the Home Falls can cause injuries. They can happen to people of all ages. There are many things you can do to make your home safe and to help prevent falls. What can I do on the outside of my home? Regularly fix the edges of walkways and driveways and fix any cracks. Remove anything that might make you trip as you walk through a door, such as a raised step or threshold. Trim any bushes or trees on the path to your home. Use bright outdoor lighting. Clear any walking paths of anything that might make someone trip, such as rocks or tools. Regularly check to see if handrails are loose or broken. Make sure that both sides of any steps have handrails. Any raised decks and porches should have guardrails on the edges. Have any leaves, snow, or ice cleared regularly. Use sand or salt on walking paths during winter. Clean up any spills in your garage right away. This includes oil or grease spills. What can I do in the bathroom? Use night lights. Install grab bars by the toilet and in the tub and shower. Do not use towel bars as grab bars. Use non-skid mats or decals in the tub or shower. If you need to sit down in the shower, use a plastic, non-slip stool. Keep the floor dry. Clean up any water that spills on the floor as soon as it happens. Remove soap buildup in the tub or shower regularly. Attach  bath mats securely with double-sided non-slip rug tape. Do not have throw rugs and other things on the floor that can make you trip. What can I do in the bedroom? Use night lights. Make sure that you have a light by your bed that is easy to reach. Do not use any sheets or blankets that are too big for your bed. They should not hang down onto the floor. Have a firm chair that has side arms. You can use this for support while you get dressed. Do not have throw rugs and other things on the floor that can make you trip. What can I do in the kitchen? Clean up any spills right away. Avoid walking on wet floors. Keep items that you use a lot in easy-to-reach places. If you need to reach something above you, use a strong step stool that has a grab bar. Keep electrical cords out of the way. Do not use floor polish or wax that makes floors slippery. If you must use wax, use non-skid floor wax. Do not have throw rugs and other things on the floor that can make you trip.  What can I do with my stairs? Do not leave any items on the stairs. Make sure that there are handrails on both sides of the stairs and use them. Fix handrails that are broken or loose. Make sure that handrails are as long as the stairways. Check any carpeting to make sure that it is firmly attached to the stairs. Fix any carpet that is loose or worn. Avoid having throw rugs at the top or bottom of the stairs. If you do have throw rugs, attach them to the floor with carpet tape. Make sure that you have a light switch at the top of the stairs and the bottom of the stairs. If you do not have them, ask someone to add them for you. What else can I do to help prevent falls? Wear shoes that: Do not have high heels. Have rubber bottoms. Are comfortable and fit you well. Are closed at the toe. Do not wear sandals. If you use a stepladder: Make sure that it is fully opened. Do not climb a closed stepladder. Make sure that both sides of the  stepladder are locked into place. Ask someone to hold it for you, if possible. Clearly mark and make sure that you can see: Any grab bars or handrails. First and last steps. Where the edge of each step is. Use tools that help you move around (mobility aids) if they are needed. These include: Canes. Walkers. Scooters. Crutches. Turn on the lights when you go into a dark area. Replace any light bulbs as soon as they burn out. Set up your furniture so you have a clear path. Avoid moving your furniture around. If any of your floors are uneven, fix them. If there are any pets around you, be aware of where they are. Review your medicines with your doctor. Some medicines can make you feel dizzy. This can increase your chance of falling. Ask your doctor what other things that you can do to help prevent falls. This information is not intended to replace advice given to you by your health care provider. Make sure you discuss any questions you have with your health care provider. Document Released: 11/20/2008 Document Revised: 07/02/2015 Document Reviewed: 02/28/2014 Elsevier Interactive Patient Education  2017 [employer].

## 2022-08-18 NOTE — Progress Notes (Signed)
Subjective:   Scott Ashley is a 89 y.o. male who presents for Medicare Annual/Subsequent preventive examination.  Visit Complete: Virtual  I connected with  Kandace Parkins on 08/18/22 by a audio enabled telemedicine application and verified that I am speaking with the correct person using two identifiers.  Patient Location: Home  Provider Location: Office/Clinic  I discussed the limitations of evaluation and management by telemedicine. The patient expressed understanding and agreed to proceed.  Review of Systems     Cardiac Risk Factors include: advanced age (>71men, >11 women);hypertension;male gender;sedentary lifestyle     Objective:    Today's Vitals   08/18/22 1340  Weight: 148 lb (67.1 kg)  Height: 6' (1.829 m)   Body mass index is 20.07 kg/m.     08/18/2022    1:33 PM 01/17/2022   10:14 PM 01/17/2022    9:55 AM 10/06/2020    8:48 AM 06/09/2020    3:18 PM 05/28/2019    2:03 PM 11/13/2017    5:50 PM  Advanced Directives  Does Patient Have a Medical Advance Directive? Yes Yes Yes Yes Yes Yes Yes  Type of Estate agent of [place];Living will Healthcare Power of [place];Living will Living will;Healthcare Power of [employer] Power of [place];Living will Healthcare Power of [place];Living will Living will;Healthcare Power of Attorney Living will  Does patient want to make changes to medical advance directive? No - Patient declined No - Patient declined       Copy of Healthcare Power of Attorney in Chart? Yes - validated most recent copy scanned in chart (See row information) Yes - validated most recent copy scanned in chart (See row information)   No - copy requested No - copy requested     Current Medications (verified) Outpatient Encounter Medications as of 08/18/2022  Medication Sig   BELSOMRA 20 MG TABS Take 0.5-1 tablets (10-20 mg total) by mouth at bedtime as needed.   finasteride (PROSCAR) 5 MG tablet Take 1 tablet (5 mg total) by  mouth daily.   latanoprost (XALATAN) 0.005 % ophthalmic solution Place 1 drop into both eyes at bedtime.   Multiple Vitamin (MULTIVITAMIN WITH MINERALS) TABS tablet Take 1 tablet by mouth daily.   omeprazole (PRILOSEC) 20 MG capsule TAKE 1 CAPSULE BY MOUTH DAILY   rosuvastatin (CRESTOR) 5 MG tablet Take 1 tablet (5 mg total) by mouth 2 (two) times a week.   Specialty Vitamins Products (PROSTATE PO) Take by mouth.   tamsulosin (FLOMAX) 0.4 MG CAPS capsule Take 1 capsule (0.4 mg total) by mouth daily.   No facility-administered encounter medications on file as of 08/18/2022.    Allergies (verified) Codeine   History: Past Medical History:  Diagnosis Date   Anxiety    Glaucoma    Hypertension    Urinary incontinence    Past Surgical History:  Procedure Laterality Date   COLONOSCOPY WITH PROPOFOL N/A 10/10/2017   Procedure: COLONOSCOPY WITH PROPOFOL;  Surgeon: Midge Minium, MD;  Location: [facility] ENDOSCOPY;  Service: Endoscopy;  Laterality: N/A;   ESOPHAGOGASTRODUODENOSCOPY (EGD) WITH PROPOFOL N/A 10/10/2017   Procedure: ESOPHAGOGASTRODUODENOSCOPY (EGD) WITH PROPOFOL;  Surgeon: Midge Minium, MD;  Location: [facility] ENDOSCOPY;  Service: Endoscopy;  Laterality: N/A;   ESOPHAGOGASTRODUODENOSCOPY (EGD) WITH PROPOFOL N/A 10/06/2020   Procedure: ESOPHAGOGASTRODUODENOSCOPY (EGD) WITH PROPOFOL;  Surgeon: Midge Minium, MD;  Location: [facility] ENDOSCOPY;  Service: Endoscopy;  Laterality: N/A;   HERNIA REPAIR     twice   TONSILLECTOMY     Family History  Problem  Relation Age of Onset   ALS Mother    Social History   Socioeconomic History   Marital status: Widowed    Spouse name: Not on file   Number of children: Not on file   Years of education: High School   Highest education level: High school graduate  Occupational History   Occupation: retired  Tobacco Use   Smoking status: Never    Passive exposure: Never   Smokeless tobacco: Never  Vaping Use   Vaping status: Never Used  Substance and  Sexual Activity   Alcohol use: Never   Drug use: Never   Sexual activity: Not Currently    Birth control/protection: None  Other Topics Concern   Not on file  Social History Narrative   Not on file   Social Determinants of Health   Financial Resource Strain: Low Risk  (08/18/2022)   Overall Financial Resource Strain (CARDIA)    Difficulty of Paying Living Expenses: Not hard at all  Food Insecurity: No Food Insecurity (08/18/2022)   Hunger Vital Sign    Worried About Running Out of Food in the Last Year: Never true    Ran Out of Food in the Last Year: Never true  Transportation Needs: No Transportation Needs (08/18/2022)   PRAPARE - Administrator, Civil Service (Medical): No    Lack of Transportation (Non-Medical): No  Physical Activity: Insufficiently Active (08/18/2022)   Exercise Vital Sign    Days of Exercise per Week: 7 days    Minutes of Exercise per Session: 10 min  Stress: No Stress Concern Present (08/18/2022)   [employer] of Occupational Health - Occupational Stress Questionnaire    Feeling of Stress : Not at all  Social Connections: Socially Isolated (08/18/2022)   Social Connection and Isolation Panel [NHANES]    Frequency of Communication with Friends and Family: More than three times a week    Frequency of Social Gatherings with Friends and Family: More than three times a week    Attends Religious Services: Never    Database administrator or Organizations: No    Attends Banker Meetings: Never    Marital Status: Widowed    Tobacco Counseling Counseling given: Not Answered   Clinical Intake:  Pre-visit preparation completed: Yes  Pain : No/denies pain     Nutritional Risks: None Diabetes: No  How often do you need to have someone help you when you read instructions, pamphlets, or other written materials from your doctor or pharmacy?: 1 - Never  Interpreter Needed?: No  Information entered by :: Kennedy Bucker,  LPN   Activities of Daily Living    08/18/2022    1:34 PM 07/28/2022    8:42 AM  In your present state of health, do you have any difficulty performing the following activities:  Hearing? 1 1  Vision? 1 1  Difficulty concentrating or making decisions? 0 0  Walking or climbing stairs? 0 0  Dressing or bathing? 0 0  Doing errands, shopping? 0 0  Preparing Food and eating ? N   Using the Toilet? N   In the past six months, have you accidently leaked urine? N   Do you have problems with loss of bowel control? N   Managing your Medications? N   Managing your Finances? N   Housekeeping or managing your Housekeeping? N     Patient Care Team: Smitty Cords, DO as PCP - General (Family Medicine)  Indicate any recent Medical Services  you may have received from other than [facility] providers in the past year (date may be approximate).     Assessment:   This is a routine wellness examination for [place].  Hearing/Vision screen Hearing Screening - Comments:: Wears aids Vision Screening - Comments:: Glasses, 'eyes are bad'- Dr.Woodard  Dietary issues and exercise activities discussed:     Goals Addressed             This Visit's Progress    DIET - EAT MORE FRUITS AND VEGETABLES         Depression Screen    08/18/2022    1:31 PM 07/28/2022    8:42 AM 07/22/2021    8:43 AM 03/26/2021    8:38 AM 06/09/2020    3:19 PM 11/27/2019    3:50 PM 05/28/2019    1:58 PM  PHQ 2/9 Scores  PHQ - 2 Score 0 0 0 0 0 0 0  PHQ- 9 Score 0   0       Fall Risk    08/18/2022    1:33 PM 07/28/2022    8:42 AM 07/22/2021   10:01 AM 07/22/2021    8:46 AM 03/26/2021    8:38 AM  Fall Risk   Falls in the past year? 1 1 0 0 0  Number falls in past yr: 1 1  0 0  Injury with Fall? 0 0  0 0  Risk for fall due to : History of fall(s)   No Fall Risks History of fall(s);Impaired balance/gait  Follow up Falls evaluation completed;Falls prevention discussed   Falls evaluation completed Falls evaluation  completed    MEDICARE RISK AT HOME:  Medicare Risk at Home - 08/18/22 1334     Any stairs in or around the home? Yes    If so, are there any without handrails? No    Home free of loose throw rugs in walkways, pet beds, electrical cords, etc? Yes    Adequate lighting in your home to reduce risk of falls? Yes    Life alert? No    Use of a cane, walker or w/c? No    Grab bars in the bathroom? Yes    Shower chair or bench in shower? Yes    Elevated toilet seat or a handicapped toilet? Yes             TIMED UP AND GO:  Was the test performed?  No    Cognitive Function:        08/18/2022    1:35 PM 07/22/2021    8:47 AM 06/09/2020    3:21 PM 05/28/2019    2:03 PM  6CIT Screen  What Year? 0 points 0 points 0 points 0 points  What month? 0 points 0 points 0 points 0 points  What time? 3 points 0 points 0 points 0 points  Count back from 20 0 points 0 points 0 points 2 points  Months in reverse 0 points 0 points 0 points 0 points  Repeat phrase 2 points 2 points 8 points 0 points  Total Score 5 points 2 points 8 points 2 points    Immunizations  There is no immunization history on file for this patient.  TDAP status: Due, Education has been provided regarding the importance of this vaccine. Advised may receive this vaccine at local pharmacy or Health Dept. Aware to provide a copy of the vaccination record if obtained from local pharmacy or Health Dept. Verbalized acceptance and understanding.  Flu Vaccine  status: Declined, Education has been provided regarding the importance of this vaccine but patient still declined. Advised may receive this vaccine at local pharmacy or Health Dept. Aware to provide a copy of the vaccination record if obtained from local pharmacy or Health Dept. Verbalized acceptance and understanding.  Pneumococcal vaccine status: Declined,  Education has been provided regarding the importance of this vaccine but patient still declined. Advised may receive  this vaccine at local pharmacy or Health Dept. Aware to provide a copy of the vaccination record if obtained from local pharmacy or Health Dept. Verbalized acceptance and understanding.   Covid-19 vaccine status: Declined, Education has been provided regarding the importance of this vaccine but patient still declined. Advised may receive this vaccine at local pharmacy or Health Dept.or vaccine clinic. Aware to provide a copy of the vaccination record if obtained from local pharmacy or Health Dept. Verbalized acceptance and understanding.  Qualifies for Shingles Vaccine? Yes   Zostavax completed No   Shingrix Completed?: No.    Education has been provided regarding the importance of this vaccine. Patient has been advised to call insurance company to determine out of pocket expense if they have not yet received this vaccine. Advised may also receive vaccine at local pharmacy or Health Dept. Verbalized acceptance and understanding.  Screening Tests Health Maintenance  Topic Date Due   COVID-19 Vaccine (1 - 2023-24 season) Never done   Zoster Vaccines- Shingrix (1 of 2) 10/28/2022 (Originally 03/30/1979)   Pneumonia Vaccine 81+ Years old (1 of 1 - PCV) 07/27/2024 (Originally 03/30/1996)   INFLUENZA VACCINE  09/08/2022   Medicare Annual Wellness (AWV)  08/18/2023   HPV VACCINES  Aged Out   DTaP/Tdap/Td  Discontinued    Health Maintenance  Health Maintenance Due  Topic Date Due   COVID-19 Vaccine (1 - 2023-24 season) Never done    Colorectal cancer screening: No longer required.   Lung Cancer Screening: (Low Dose CT Chest recommended if Age 32-80 years, 20 pack-year currently smoking OR have quit w/in 15years.) does not qualify.    Additional Screening:  Hepatitis C Screening: does not qualify; Completed no  Vision Screening: Recommended annual ophthalmology exams for early detection of glaucoma and other disorders of the eye. Is the patient up to date with their annual eye exam?  Yes   Who is the provider or what is the name of the office in which the patient attends annual eye exams? Dr.Woodard If pt is not established with a provider, would they like to be referred to a provider to establish care? No .   Dental Screening: Recommended annual dental exams for proper oral hygiene   Community Resource Referral / Chronic Care Management: CRR required this visit?  No   CCM required this visit?  No     Plan:     I have personally reviewed and noted the following in the patient's chart:   Medical and social history Use of alcohol, tobacco or illicit drugs  Current medications and supplements including opioid prescriptions. Patient is not currently taking opioid prescriptions. Functional ability and status Nutritional status Physical activity Advanced directives List of other physicians Hospitalizations, surgeries, and ER visits in previous 12 months Vitals Screenings to include cognitive, depression, and falls Referrals and appointments  In addition, I have reviewed and discussed with patient certain preventive protocols, quality metrics, and best practice recommendations. A written personalized care plan for preventive services as well as general preventive health recommendations were provided to patient.  Hal Hope, LPN   10/06/5619   After Visit Summary: (MyChart) Due to this being a telephonic visit, the after visit summary with patients personalized plan was offered to patient via MyChart   Nurse Notes: none

## 2022-08-25 ENCOUNTER — Ambulatory Visit: Payer: Medicare Other | Admitting: Physician Assistant

## 2022-08-25 VITALS — BP 148/92 | HR 80 | Ht 72.0 in | Wt 148.0 lb

## 2022-08-25 DIAGNOSIS — R339 Retention of urine, unspecified: Secondary | ICD-10-CM

## 2022-08-25 DIAGNOSIS — T386X5A Adverse effect of antigonadotrophins, antiestrogens, antiandrogens, not elsewhere classified, initial encounter: Secondary | ICD-10-CM

## 2022-08-25 DIAGNOSIS — N62 Hypertrophy of breast: Secondary | ICD-10-CM | POA: Diagnosis not present

## 2022-08-25 DIAGNOSIS — Z466 Encounter for fitting and adjustment of urinary device: Secondary | ICD-10-CM | POA: Diagnosis not present

## 2022-08-25 NOTE — Progress Notes (Signed)
Cath Change/ Replacement  Patient is present today for a catheter change due to urinary retention.  8ml of water was removed from the balloon, a 16FR coude foley cath was removed without difficulty.  Patient was cleaned and prepped in a sterile fashion with betadine and 2% lidocaine jelly was instilled into the urethra. A 16 FR coude foley cath was replaced into the bladder, no complications were noted. Urine return was noted 15ml and urine was yellow in color. The balloon was filled with 10ml of sterile water. A leg bag was attached for drainage.  Patient tolerated well.    Performed by: Carman Ching, PA-C   Additional notes: He has had some mild breast soreness on finasteride but overall is minimally uncomfortable and wishes to continue the medication. Mild gynecomastia on physical exam today; no masses, nodules, or nipple discharge appreciated.  Follow up: Return in about 4 weeks (around 09/22/2022) for Catheter exchange.

## 2022-08-25 NOTE — Progress Notes (Signed)
Scott Ashley presents for an office visit. BP today is __148/92________. He is not on BP medication. Greater than 140/90. Provider  notified. Pt advised to__f/u with PCP____________. Pt voiced understanding.

## 2022-09-02 DIAGNOSIS — H6123 Impacted cerumen, bilateral: Secondary | ICD-10-CM | POA: Diagnosis not present

## 2022-09-02 DIAGNOSIS — H903 Sensorineural hearing loss, bilateral: Secondary | ICD-10-CM | POA: Diagnosis not present

## 2022-09-22 ENCOUNTER — Ambulatory Visit: Payer: Medicare Other | Admitting: Physician Assistant

## 2022-09-22 VITALS — BP 128/78 | HR 156

## 2022-09-22 DIAGNOSIS — R339 Retention of urine, unspecified: Secondary | ICD-10-CM

## 2022-09-22 DIAGNOSIS — Z466 Encounter for fitting and adjustment of urinary device: Secondary | ICD-10-CM

## 2022-09-22 NOTE — Progress Notes (Signed)
Cath Change/ Replacement  Patient is present today for a catheter change due to urinary retention.  8ml of water was removed from the balloon, a 16FR coude foley cath was removed without difficulty.  Patient was cleaned and prepped in a sterile fashion with betadine. A 16 FR coude foley cath was replaced into the bladder, no complications were noted. Urine return was not noted but catheter was hubbed. The balloon was filled with 10ml of sterile water. A leg bag was attached for drainage.  Patient tolerated well.    Performed by: Carman Ching, PA-C   Additional notes: He wants to defer voiding trial for now but remain on finasteride, thinks he may want to attempt VT again in the future.  Follow up: Return in about 4 weeks (around 10/20/2022) for Catheter exchange.

## 2022-10-26 ENCOUNTER — Ambulatory Visit: Payer: Medicare Other | Admitting: Physician Assistant

## 2022-10-26 ENCOUNTER — Encounter: Payer: Self-pay | Admitting: Physician Assistant

## 2022-10-26 VITALS — BP 124/71 | HR 124 | Ht 72.0 in | Wt 148.0 lb

## 2022-10-26 DIAGNOSIS — Z466 Encounter for fitting and adjustment of urinary device: Secondary | ICD-10-CM

## 2022-10-26 DIAGNOSIS — R339 Retention of urine, unspecified: Secondary | ICD-10-CM | POA: Diagnosis not present

## 2022-10-26 NOTE — Progress Notes (Signed)
Cath Change/ Replacement  Patient is present today for a catheter change due to urinary retention.  8ml of water was removed from the balloon, a 16FR coude foley cath was removed without difficulty.  Patient was cleaned and prepped in a sterile fashion with betadine and 2% lidocaine jelly was instilled into the urethra. A 16 FR coude foley cath was replaced into the bladder, no complications were noted. Urine return was noted 15ml and urine was yellow in color. The balloon was filled with 10ml of sterile water. A leg bag was attached for drainage. Patient tolerated well.    Performed by: Carman Ching, PA-C   Additional notes: He notes intermittent bladder discomfort and GI changes this month (increased hunger, indigestion). He wonders if these could indicate UTI. Low suspicion for this, but I did obtain a urine sample from his new catheter today for culture; will reassess symptoms when it results and consider abx.  Follow up: Return in about 3 weeks (around 11/16/2022) for Catheter exchange.

## 2022-10-29 LAB — CULTURE, URINE COMPREHENSIVE

## 2022-11-02 ENCOUNTER — Encounter: Payer: Self-pay | Admitting: Family Medicine

## 2022-11-03 ENCOUNTER — Ambulatory Visit: Payer: Medicare Other | Admitting: Family Medicine

## 2022-11-18 ENCOUNTER — Encounter: Payer: Self-pay | Admitting: Physician Assistant

## 2022-11-18 ENCOUNTER — Ambulatory Visit: Payer: Medicare Other | Admitting: Physician Assistant

## 2022-11-18 VITALS — BP 134/84 | HR 144 | Ht 72.0 in | Wt 156.0 lb

## 2022-11-18 DIAGNOSIS — R339 Retention of urine, unspecified: Secondary | ICD-10-CM

## 2022-11-18 DIAGNOSIS — Z466 Encounter for fitting and adjustment of urinary device: Secondary | ICD-10-CM

## 2022-11-18 NOTE — Progress Notes (Signed)
Cath Change/ Replacement  Patient is present today for a catheter change due to urinary retention.  8ml of water was removed from the balloon, a 16FR coude foley cath was removed without difficulty.  Patient was cleaned and prepped in a sterile fashion with betadine and 2% lidocaine jelly was instilled into the urethra. A 16 FR coude foley cath was replaced into the bladder, no complications were noted. Urine return was noted 30ml and urine was yellow in color. The balloon was filled with 10ml of sterile water. A leg bag was attached for drainage.  Patient tolerated well.    Performed by: Carman Ching, PA-C   Follow up: Return in about 4 weeks (around 12/16/2022) for Catheter exchange.

## 2022-11-26 ENCOUNTER — Encounter: Payer: Self-pay | Admitting: Emergency Medicine

## 2022-11-26 ENCOUNTER — Other Ambulatory Visit: Payer: Self-pay

## 2022-11-26 DIAGNOSIS — T83091A Other mechanical complication of indwelling urethral catheter, initial encounter: Secondary | ICD-10-CM | POA: Insufficient documentation

## 2022-11-26 DIAGNOSIS — Y732 Prosthetic and other implants, materials and accessory gastroenterology and urology devices associated with adverse incidents: Secondary | ICD-10-CM | POA: Diagnosis not present

## 2022-11-26 DIAGNOSIS — N3000 Acute cystitis without hematuria: Secondary | ICD-10-CM | POA: Insufficient documentation

## 2022-11-26 NOTE — ED Triage Notes (Addendum)
Pt in with daughter, pt reports he has slowly been having decreased u/o into foley drainage bag. Now has little to none, and had a foley changed out on 10/11. Hx of prostate issues and indwelling foley x 1 yr

## 2022-11-27 ENCOUNTER — Emergency Department
Admission: EM | Admit: 2022-11-27 | Discharge: 2022-11-27 | Disposition: A | Discharge status: Home / Self Care | Payer: Medicare Other | Attending: Emergency Medicine | Admitting: Emergency Medicine

## 2022-11-27 DIAGNOSIS — T839XXA Unspecified complication of genitourinary prosthetic device, implant and graft, initial encounter: Secondary | ICD-10-CM

## 2022-11-27 DIAGNOSIS — N3000 Acute cystitis without hematuria: Secondary | ICD-10-CM

## 2022-11-27 LAB — URINALYSIS, ROUTINE W REFLEX MICROSCOPIC
Bilirubin Urine: NEGATIVE
Glucose, UA: NEGATIVE mg/dL
Hgb urine dipstick: NEGATIVE
Ketones, ur: NEGATIVE mg/dL
Nitrite: NEGATIVE
Protein, ur: 100 mg/dL — AB
Specific Gravity, Urine: 1.021 (ref 1.005–1.030)
WBC, UA: >50 WBC/hpf (ref 0–5)
pH: 8 (ref 5.0–8.0)

## 2022-11-27 MED ORDER — CEPHALEXIN 500 MG PO CAPS
500.0000 mg | ORAL_CAPSULE | Freq: Once | ORAL | Status: AC
  Administered 2022-11-27: 500 mg via ORAL
  Filled 2022-11-27: qty 1

## 2022-11-27 MED ORDER — CEPHALEXIN 500 MG PO CAPS: 500.0000 mg | ORAL_CAPSULE | Freq: Three times a day (TID) | ORAL | 0 refills | Status: AC

## 2022-11-27 NOTE — Discharge Instructions (Signed)
We replaced the Foley catheter with a new one.  Urine sample off of this new catheter is concerning for infection.  A urine culture is pending and he is started on antibiotics, 3 times daily for 5 days.

## 2022-11-27 NOTE — ED Notes (Signed)
Pt's existing catheter removed and changed out to 37fr coude. Draining patently at this time, pt reports relief of discomfort. Dr. Katrinka Blazing made aware, will await results of UA

## 2022-11-27 NOTE — ED Notes (Signed)
This RN attempted irrigating foley with no success. Pt has existing 76fr coude, awaiting equipment to replace.

## 2022-11-27 NOTE — ED Provider Notes (Signed)
[facility] Provider Note    Event Date/Time   First MD Initiated Contact with Patient 11/27/22 [phone]     (approximate)   History   Catheter Issue   HPI  Scott Ashley is a 89 y.o. male who presents to the ED for evaluation of Catheter Issue   I reviewed urology clinic visit from a month ago.  Routine catheter exchanges for retention.  Patient presents from home with family for evaluation of a nondraining urinary catheter.  No fevers, emesis or other concerns.  It is replaced in triage due to retention, and he subsequently expresses relief of symptoms and has no complaints.   Physical Exam   Triage Vital Signs: ED Triage Vitals  Encounter Vitals Group     BP 11/26/22 2351 (!) 157/114     Systolic BP Percentile --      Diastolic BP Percentile --      Pulse Rate 11/26/22 2351 79     Resp 11/26/22 2351 17     Temp 11/26/22 2351 97.8 F (36.6 C)     Temp src --      SpO2 11/26/22 2351 94 %     Weight 11/26/22 2353 156 lb (70.8 kg)     Height --      Head Circumference --      Peak Flow --      Pain Score 11/26/22 2353 7     Pain Loc --      Pain Education --      Exclude from Growth Chart --     Most recent vital signs: Vitals:   11/26/22 2351  BP: (!) 157/114  Pulse: 79  Resp: 17  Temp: 97.8 F (36.6 C)  SpO2: 94%    General: Awake, no distress.  CV:  Good peripheral perfusion.  Resp:  Normal effort.  Abd:  No distention.  MSK:  No deformity noted.  Neuro:  No focal deficits appreciated. Other:     ED Results / Procedures / Treatments   Labs (all labs ordered are listed, but only abnormal results are displayed) Labs Reviewed  URINALYSIS, ROUTINE W REFLEX MICROSCOPIC - Abnormal; Notable for the following components:      Result Value   Color, Urine AMBER (*)    APPearance CLOUDY (*)    Protein, ur 100 (*)    Leukocytes,Ua LARGE (*)    Bacteria, UA MANY (*)    Non Squamous Epithelial PRESENT (*)    All other  components within normal limits  URINE CULTURE    EKG   RADIOLOGY   Official radiology report(s): No results found.  PROCEDURES and INTERVENTIONS:  Procedures  Medications  cephALEXin (KEFLEX) capsule 500 mg (has no administration in time range)     IMPRESSION / MDM / ASSESSMENT AND PLAN / ED COURSE  I reviewed the triage vital signs and the nursing notes.  Differential diagnosis includes, but is not limited to, blocked catheter, hematuria, cystitis  {Patient presents with symptoms of an acute illness or injury that is potentially life-threatening.  Patient presents requiring catheter exchange, subsequent urinalysis from a fresh tube is concerning for infection.  Sent for a culture and patient empirically started on Keflex.  Suitable for outpatient management with urology follow-up.      FINAL CLINICAL IMPRESSION(S) / ED DIAGNOSES   Final diagnoses:  Problem with Foley catheter, initial encounter (HCC)  Acute cystitis without hematuria     Rx / DC Orders   ED  Discharge Orders          Ordered    cephALEXin (KEFLEX) 500 MG capsule  3 times daily        11/27/22 0219             Note:  This document was prepared using Dragon voice recognition software and may include unintentional dictation errors.   Delton Prairie, MD 11/27/22 [phone]

## 2022-11-28 LAB — URINE CULTURE

## 2022-12-12 ENCOUNTER — Emergency Department
Admission: EM | Admit: 2022-12-12 | Discharge: 2022-12-13 | Disposition: A | Discharge status: Home / Self Care | Payer: Medicare Other | Attending: Emergency Medicine | Admitting: Emergency Medicine

## 2022-12-12 ENCOUNTER — Other Ambulatory Visit: Payer: Self-pay

## 2022-12-12 DIAGNOSIS — Y69 Unspecified misadventure during surgical and medical care: Secondary | ICD-10-CM | POA: Insufficient documentation

## 2022-12-12 DIAGNOSIS — T83091A Other mechanical complication of indwelling urethral catheter, initial encounter: Secondary | ICD-10-CM | POA: Diagnosis not present

## 2022-12-12 DIAGNOSIS — N183 Chronic kidney disease, stage 3 unspecified: Secondary | ICD-10-CM | POA: Insufficient documentation

## 2022-12-12 DIAGNOSIS — I129 Hypertensive chronic kidney disease with stage 1 through stage 4 chronic kidney disease, or unspecified chronic kidney disease: Secondary | ICD-10-CM | POA: Insufficient documentation

## 2022-12-12 DIAGNOSIS — T83098A Other mechanical complication of other indwelling urethral catheter, initial encounter: Secondary | ICD-10-CM | POA: Diagnosis not present

## 2022-12-12 DIAGNOSIS — T839XXA Unspecified complication of genitourinary prosthetic device, implant and graft, initial encounter: Secondary | ICD-10-CM

## 2022-12-12 MED ORDER — LIDOCAINE HCL URETHRAL/MUCOSAL 2 % EX GEL
1.0000 | Freq: Once | CUTANEOUS | Status: AC
  Administered 2022-12-13: 1 via URETHRAL
  Filled 2022-12-12: qty 6

## 2022-12-12 NOTE — ED Provider Notes (Signed)
[facility] Provider Note    Event Date/Time   First MD Initiated Contact with Patient 12/12/22 2309     (approximate)   History   Urinary Retention (Catheter problem)   HPI  Scott Ashley is a 89 y.o. male brought to the ED from home by his daughter with a chief complaint of clogged Foley catheter.  Patient has required Foley catheter for retention for the past year, has monthly exchanges.  Seen in the ED approximately 2 weeks ago for same, Foley exchanged.  Placed on antibiotics for UTI.  Notes crystallization in catheter tubing.  Denies fever/chills, chest pain, shortness of breath, nausea, vomiting or dizziness.  Endorses bladder discomfort.     Past Medical History   Past Medical History:  Diagnosis Date   Anxiety    Glaucoma    Hypertension    Urinary incontinence      Active Problem List   Patient Active Problem List   Diagnosis Date Noted   Protein-calorie malnutrition, severe 01/18/2022   Acute on chronic urinary retention 01/17/2022   Leukocytosis 01/17/2022   Dysphagia    Stricture and stenosis of esophagus    Elevated TSH 05/26/2020   Pure hypercholesterolemia 05/28/2019   BPH with obstruction/lower urinary tract symptoms 05/28/2019   Renal mass, left 05/28/2019   Insomnia 05/28/2019   Tinea corporis 11/27/2018   CKD (chronic kidney disease), stage III 01/17/2014   Benign hypertension with CKD (chronic kidney disease) stage III (HCC) 01/17/2014   History of phlebitis 01/17/2014   Glaucoma (increased eye pressure) 01/17/2014     Past Surgical History   Past Surgical History:  Procedure Laterality Date   COLONOSCOPY WITH PROPOFOL N/A 10/10/2017   Procedure: COLONOSCOPY WITH PROPOFOL;  Surgeon: Midge Minium, MD;  Location: [facility] ENDOSCOPY;  Service: Endoscopy;  Laterality: N/A;   ESOPHAGOGASTRODUODENOSCOPY (EGD) WITH PROPOFOL N/A 10/10/2017   Procedure: ESOPHAGOGASTRODUODENOSCOPY (EGD) WITH PROPOFOL;  Surgeon: Midge Minium, MD;   Location: [facility] ENDOSCOPY;  Service: Endoscopy;  Laterality: N/A;   ESOPHAGOGASTRODUODENOSCOPY (EGD) WITH PROPOFOL N/A 10/06/2020   Procedure: ESOPHAGOGASTRODUODENOSCOPY (EGD) WITH PROPOFOL;  Surgeon: Midge Minium, MD;  Location: [facility] ENDOSCOPY;  Service: Endoscopy;  Laterality: N/A;   HERNIA REPAIR     twice   TONSILLECTOMY       Home Medications   Prior to Admission medications   Medication Sig Start Date End Date Taking? Authorizing Provider  BELSOMRA 20 MG TABS Take 0.5-1 tablets (10-20 mg total) by mouth at bedtime as needed. 07/28/22   Karamalegos, Netta Neat, DO  finasteride (PROSCAR) 5 MG tablet Take 1 tablet (5 mg total) by mouth daily. 01/27/22   Sondra Come, MD  latanoprost (XALATAN) 0.005 % ophthalmic solution Place 1 drop into both eyes at bedtime. 05/01/17   [provider]  Multiple Vitamin (MULTIVITAMIN WITH MINERALS) TABS tablet Take 1 tablet by mouth daily. 01/21/22   Sunnie Nielsen, DO  omeprazole (PRILOSEC) 20 MG capsule TAKE 1 CAPSULE BY MOUTH DAILY 08/09/22   Althea Charon, Netta Neat, DO  rosuvastatin (CRESTOR) 5 MG tablet Take 1 tablet (5 mg total) by mouth 2 (two) times a week. 07/28/22   Smitty Cords, DO  Specialty Vitamins Products (PROSTATE PO) Take by mouth.    [provider]  tamsulosin (FLOMAX) 0.4 MG CAPS capsule Take 1 capsule (0.4 mg total) by mouth daily. 07/28/22   Smitty Cords, DO     Allergies  Codeine   Family History   Family History  Problem Relation  Age of Onset   ALS Mother      Physical Exam  Triage Vital Signs: ED Triage Vitals  Encounter Vitals Group     BP 12/12/22 2232 134/84     Systolic BP Percentile --      Diastolic BP Percentile --      Pulse Rate 12/12/22 2232 (!) 120     Resp 12/12/22 2232 18     Temp 12/12/22 2232 (!) 97.5 F (36.4 C)     Temp src --      SpO2 12/12/22 2232 100 %     Weight 12/12/22 2230 150 lb (68 kg)     Height 12/12/22 2230 5\' 10"  (1.778 m)      Head Circumference --      Peak Flow --      Pain Score 12/12/22 2230 6     Pain Loc --      Pain Education --      Exclude from Growth Chart --     Updated Vital Signs: BP 134/84 (BP Location: Left Arm)   Pulse (!) 120   Temp (!) 97.5 F (36.4 C)   Resp 18   Ht 5\' 10"  (1.778 m)   Wt 68 kg   SpO2 100%   BMI 21.52 kg/m    General: Awake, no distress.  CV:  RRR.  Good peripheral perfusion.  Resp:  Normal effort.  CTAB. Abd:  Nontender.  No distention.  Other:  Foley catheter noted without drainage   ED Results / Procedures / Treatments  Labs (all labs ordered are listed, but only abnormal results are displayed) Labs Reviewed - No data to display   EKG  None   RADIOLOGY None   Official radiology report(s): No results found.   PROCEDURES:  Critical Care performed: No  Procedures   MEDICATIONS ORDERED IN ED: Medications  lidocaine (XYLOCAINE) 2 % jelly 1 Application (1 Application Urethral Given 12/13/22 0015)     IMPRESSION / MDM / ASSESSMENT AND PLAN / ED COURSE  I reviewed the triage vital signs and the nursing notes.                             89 year old male presenting with clogged Foley catheter.  Heart rate has normalized since triage vitals.  Nursing to replace Foley.  Patient's presentation is most consistent with acute, uncomplicated illness.  0030 16 Fr Foley replaced by nurse.  Patient will follow-up with his urologist.  Strict return precautions given.  Patient and family member verbalized understanding agree with plan of care.  FINAL CLINICAL IMPRESSION(S) / ED DIAGNOSES   Final diagnoses:  Problem with Foley catheter, initial encounter [facility])     Rx / DC Orders   ED Discharge Orders     None        Note:  This document was prepared using Dragon voice recognition software and may include unintentional dictation errors.   Irean Hong, MD 12/13/22 [phone]

## 2022-12-12 NOTE — ED Triage Notes (Signed)
Pt reports his urinary catheter has not been draining today, daughter reports noticing "crystals" in his cathter tubing. Pt states he feels like he has a full bladder, denies blood in urine.

## 2022-12-12 NOTE — Discharge Instructions (Signed)
Return to the ER for worsening symptoms, persistent vomiting, fever or other concerns.

## 2022-12-12 NOTE — ED Provider Notes (Incomplete)
[facility] Provider Note    Event Date/Time   First MD Initiated Contact with Patient 12/12/22 2309     (approximate)   History   Urinary Retention (Catheter problem)   HPI  Scott Ashley is a 88 y.o. male brought to the ED from home by his daughter with a chief complaint of clogged Foley catheter.  Patient has required Foley catheter for retention for the past year, has monthly exchanges.  Seen in the ED approximately 2 weeks ago for same, Foley exchanged.  Placed on antibiotics for UTI.  Notes crystallization in catheter tubing.  Denies fever/chills, chest pain, shortness of breath, nausea, vomiting or dizziness.  Endorses bladder discomfort.     Past Medical History   Past Medical History:  Diagnosis Date  . Anxiety   . Glaucoma   . Hypertension   . Urinary incontinence      Active Problem List   Patient Active Problem List   Diagnosis Date Noted  . Protein-calorie malnutrition, severe 01/18/2022  . Acute on chronic urinary retention 01/17/2022  . Leukocytosis 01/17/2022  . Dysphagia   . Stricture and stenosis of esophagus   . Elevated TSH 05/26/2020  . Pure hypercholesterolemia 05/28/2019  . BPH with obstruction/lower urinary tract symptoms 05/28/2019  . Renal mass, left 05/28/2019  . Insomnia 05/28/2019  . Tinea corporis 11/27/2018  . CKD (chronic kidney disease), stage III 01/17/2014  . Benign hypertension with CKD (chronic kidney disease) stage III (HCC) 01/17/2014  . History of phlebitis 01/17/2014  . Glaucoma (increased eye pressure) 01/17/2014     Past Surgical History   Past Surgical History:  Procedure Laterality Date  . COLONOSCOPY WITH PROPOFOL N/A 10/10/2017   Procedure: COLONOSCOPY WITH PROPOFOL;  Surgeon: Midge Minium, MD;  Location: [facility] ENDOSCOPY;  Service: Endoscopy;  Laterality: N/A;  . ESOPHAGOGASTRODUODENOSCOPY (EGD) WITH PROPOFOL N/A 10/10/2017   Procedure: ESOPHAGOGASTRODUODENOSCOPY (EGD) WITH PROPOFOL;   Surgeon: Midge Minium, MD;  Location: [facility] ENDOSCOPY;  Service: Endoscopy;  Laterality: N/A;  . ESOPHAGOGASTRODUODENOSCOPY (EGD) WITH PROPOFOL N/A 10/06/2020   Procedure: ESOPHAGOGASTRODUODENOSCOPY (EGD) WITH PROPOFOL;  Surgeon: Midge Minium, MD;  Location: [facility] ENDOSCOPY;  Service: Endoscopy;  Laterality: N/A;  . HERNIA REPAIR     twice  . TONSILLECTOMY       Home Medications   Prior to Admission medications   Medication Sig Start Date End Date Taking? Authorizing Provider  BELSOMRA 20 MG TABS Take 0.5-1 tablets (10-20 mg total) by mouth at bedtime as needed. 07/28/22   Karamalegos, Netta Neat, DO  finasteride (PROSCAR) 5 MG tablet Take 1 tablet (5 mg total) by mouth daily. 01/27/22   Sondra Come, MD  latanoprost (XALATAN) 0.005 % ophthalmic solution Place 1 drop into both eyes at bedtime. 05/01/17   [provider]  Multiple Vitamin (MULTIVITAMIN WITH MINERALS) TABS tablet Take 1 tablet by mouth daily. 01/21/22   Sunnie Nielsen, DO  omeprazole (PRILOSEC) 20 MG capsule TAKE 1 CAPSULE BY MOUTH DAILY 08/09/22   Althea Charon, Netta Neat, DO  rosuvastatin (CRESTOR) 5 MG tablet Take 1 tablet (5 mg total) by mouth 2 (two) times a week. 07/28/22   Smitty Cords, DO  Specialty Vitamins Products (PROSTATE PO) Take by mouth.    [provider]  tamsulosin (FLOMAX) 0.4 MG CAPS capsule Take 1 capsule (0.4 mg total) by mouth daily. 07/28/22   Smitty Cords, DO     Allergies  Codeine   Family History   Family History  Problem Relation  Age of Onset  . ALS Mother      Physical Exam  Triage Vital Signs: ED Triage Vitals  Encounter Vitals Group     BP 12/12/22 2232 134/84     Systolic BP Percentile --      Diastolic BP Percentile --      Pulse Rate 12/12/22 2232 (!) 120     Resp 12/12/22 2232 18     Temp 12/12/22 2232 (!) 97.5 F (36.4 C)     Temp src --      SpO2 12/12/22 2232 100 %     Weight 12/12/22 2230 150 lb (68 kg)     Height  12/12/22 2230 5\' 10"  (1.778 m)     Head Circumference --      Peak Flow --      Pain Score 12/12/22 2230 6     Pain Loc --      Pain Education --      Exclude from Growth Chart --     Updated Vital Signs: BP 134/84 (BP Location: Left Arm)   Pulse (!) 120   Temp (!) 97.5 F (36.4 C)   Resp 18   Ht 5\' 10"  (1.778 m)   Wt 68 kg   SpO2 100%   BMI 21.52 kg/m    General: Awake, no distress.  CV:  RRR.  Good peripheral perfusion.  Resp:  Normal effort.  CTAB. Abd:  Nontender.  No distention.  Other:  Foley catheter noted without drainage   ED Results / Procedures / Treatments  Labs (all labs ordered are listed, but only abnormal results are displayed) Labs Reviewed - No data to display   EKG  None   RADIOLOGY None   Official radiology report(s): No results found.   PROCEDURES:  Critical Care performed: No  Procedures   MEDICATIONS ORDERED IN ED: Medications  lidocaine (XYLOCAINE) 2 % jelly 1 Application (has no administration in time range)     IMPRESSION / MDM / ASSESSMENT AND PLAN / ED COURSE  I reviewed the triage vital signs and the nursing notes.                             89 year old male presenting with clogged Foley catheter.  Patient's presentation is most consistent with acute, uncomplicated illness.   FINAL CLINICAL IMPRESSION(S) / ED DIAGNOSES   Final diagnoses:  Problem with Foley catheter, initial encounter [facility])     Rx / DC Orders   ED Discharge Orders     None        Note:  This document was prepared using Dragon voice recognition software and may include unintentional dictation errors.

## 2022-12-13 NOTE — Telephone Encounter (Signed)
Spoke with granddaughter and the catheter is still draining it's just some blood in the bag. I explained to her he's had some trama to his urethra  and it could be some blood. I also advised I would ask sam if he needs to be seen today. The ER did change his cath and he's not having any symptoms.

## 2022-12-16 ENCOUNTER — Ambulatory Visit: Payer: Medicare Other | Admitting: Physician Assistant

## 2022-12-16 ENCOUNTER — Encounter: Payer: Self-pay | Admitting: Physician Assistant

## 2022-12-16 VITALS — BP 127/80 | HR 96 | Temp 98.3°F | Ht 70.0 in | Wt 158.0 lb

## 2022-12-16 DIAGNOSIS — R338 Other retention of urine: Secondary | ICD-10-CM | POA: Diagnosis not present

## 2022-12-16 DIAGNOSIS — Z466 Encounter for fitting and adjustment of urinary device: Secondary | ICD-10-CM

## 2022-12-16 DIAGNOSIS — T83198A Other mechanical complication of other urinary devices and implants, initial encounter: Secondary | ICD-10-CM

## 2022-12-16 MED ORDER — CEFDINIR 300 MG PO CAPS: 300.0000 mg | ORAL_CAPSULE | Freq: Two times a day (BID) | ORAL | 0 refills | Status: AC

## 2022-12-16 NOTE — Progress Notes (Signed)
He has been seen in the emergency department twice since his last catheter change due to urinary encrustation and catheter occlusion.  We had a lengthy conversation today about encrustation prevention including increased p.o. hydration with increased citrate intake, switching to a Silastic catheter, consideration of vinegar bladder instillations, and a course of antibiotics to temporarily clear any urea splitting organisms from the urine.  Will defer vinegar bladder instillations, but elected to switch his catheter to a Silastic today, increase p.o. hydration, and will treat with 7 days of Omnicef.  If he continues to encrust frequently, may need to increase frequency of catheter exchanges.  Cath Change/ Replacement  Patient is present today for a catheter change due to urinary retention.  8ml of water was removed from the balloon, a 16FR coude foley cath was removed without difficulty.  Patient was cleaned and prepped in a sterile fashion with betadine and 2% lidocaine jelly was instilled into the urethra. A 16 FR Silastic foley cath was replaced into the bladder, no complications were noted. Urine return was noted 5ml and urine was yellow in color. The balloon was filled with 10ml of sterile water. A leg bag was attached for drainage.  Patient tolerated well.    Performed by: Carman Ching, PA-C and Annabell Sabal, CMA  Follow up: Return in about 4 weeks (around 01/13/2023) for Catheter exchange.

## 2022-12-19 ENCOUNTER — Other Ambulatory Visit: Payer: Self-pay | Admitting: Physician Assistant

## 2022-12-19 LAB — CULTURE, URINE COMPREHENSIVE

## 2022-12-19 NOTE — Progress Notes (Signed)
error 

## 2023-01-04 ENCOUNTER — Telehealth: Payer: Self-pay | Admitting: *Deleted

## 2023-01-04 ENCOUNTER — Encounter: Payer: Self-pay | Admitting: Urology

## 2023-01-04 ENCOUNTER — Ambulatory Visit: Payer: Medicare Other | Admitting: Urology

## 2023-01-04 VITALS — BP 134/76 | HR 137

## 2023-01-04 DIAGNOSIS — R3989 Other symptoms and signs involving the genitourinary system: Secondary | ICD-10-CM

## 2023-01-04 DIAGNOSIS — N138 Other obstructive and reflux uropathy: Secondary | ICD-10-CM | POA: Diagnosis not present

## 2023-01-04 DIAGNOSIS — R339 Retention of urine, unspecified: Secondary | ICD-10-CM

## 2023-01-04 DIAGNOSIS — N401 Enlarged prostate with lower urinary tract symptoms: Secondary | ICD-10-CM | POA: Diagnosis not present

## 2023-01-04 LAB — URINALYSIS, COMPLETE
Bilirubin, UA: NEGATIVE
Glucose, UA: NEGATIVE
Ketones, UA: NEGATIVE
Nitrite, UA: NEGATIVE
Specific Gravity, UA: 1.02 (ref 1.005–1.030)
Urobilinogen, Ur: 0.2 mg/dL (ref 0.2–1.0)
pH, UA: 6.5 (ref 5.0–7.5)

## 2023-01-04 LAB — MICROSCOPIC EXAMINATION: RBC, Urine: >30 /[HPF] — AB (ref 0–2)

## 2023-01-04 MED ORDER — SULFAMETHOXAZOLE-TRIMETHOPRIM 800-160 MG PO TABS: 1.0000 | ORAL_TABLET | Freq: Two times a day (BID) | ORAL | 0 refills | Status: DC

## 2023-01-04 NOTE — Telephone Encounter (Signed)
Yes today thanks

## 2023-01-04 NOTE — Progress Notes (Signed)
01/04/2023 4:30 PM   Scott Ashley 1929-09-27 [id number]  Referring provider: Smitty Cords, DO [address] [place],  [place] [address]  Urological history: 1. Left renal mass - RUS (12/2018) - 1 cm corresponding to suspected RCC on 2019 MRI - Surveillance discontinued due to advanced age and comorbidities  2. Renal cysts - RUS (12/2018) -bilateral renal cysts, measuring up to 6 cm, benign  3. BPH with retention -RUS (12/2018) -trabeculated bladder -Prostate volume estimated in 150 g -Managed with an indwelling Foley changed monthly  Chief Complaint  Patient presents with   Follow-up   HPI: Scott Ashley is a 89 y.o. male who presents today for bladder pain with his granddaughter, Idalia Needle.   Previous records reviewed.   He was seen on December 16, 2022 for catheter exchange.  At that exchange it was noted that he had 2 incidences where his catheter had to be changed earlier than planned due to encrustation.  He was treated with 7 days of Omnicef and changed to a Silastic Foley.  Urine culture from that visit grew out multiple species.  Granddaughter called in today and states her grandpa is having some bladder pain . Level 3 pain . The urine is draining into the bag, no fever or chills. Patient just think he needs more medication because he thinks he is having another bladder infections.   He stated he started feeling lower abdominal pain last night.  He states it feels like his stomach is tight.   He states his catheter has been draining.  He denied any gross hematuria, sediment or mucus.  He states the urine has been clear.  He denied any diarrhea or constipation.  He states he had a very good bowel movement this morning.  Patient denies any modifying or aggravating factors.  Patient denies any recent UTI's, gross hematuria, dysuria or flank pain.  Patient denies any fevers, chills, nausea or vomiting.    CATH UA with pyuria, hematuria and bacteriuria  PMH: Past  Medical History:  Diagnosis Date   Anxiety    Glaucoma    Hypertension    Urinary incontinence     Surgical History: Past Surgical History:  Procedure Laterality Date   COLONOSCOPY WITH PROPOFOL N/A 10/10/2017   Procedure: COLONOSCOPY WITH PROPOFOL;  Surgeon: Midge Minium, MD;  Location: [facility]OSCOPY;  Service: Endoscopy;  Laterality: N/A;   ESOPHAGOGASTRODUODENOSCOPY (EGD) WITH PROPOFOL N/A 10/10/2017   Procedure: ESOPHAGOGASTRODUODENOSCOPY (EGD) WITH PROPOFOL;  Surgeon: Midge Minium, MD;  Location: [facility]OSCOPY;  Service: Endoscopy;  Laterality: N/A;   ESOPHAGOGASTRODUODENOSCOPY (EGD) WITH PROPOFOL N/A 10/06/2020   Procedure: ESOPHAGOGASTRODUODENOSCOPY (EGD) WITH PROPOFOL;  Surgeon: Midge Minium, MD;  Location: [facility] ENDOSCOPY;  Service: Endoscopy;  Laterality: N/A;   HERNIA REPAIR     twice   TONSILLECTOMY      Home Medications:  Allergies as of 01/04/2023       Reactions   Codeine Other (See Comments)        Medication List        Accurate as of January 04, 2023  4:30 PM. If you have any questions, ask your nurse or doctor.          Belsomra 20 MG Tabs Generic drug: Suvorexant Take 0.5-1 tablets (10-20 mg total) by mouth at bedtime as needed.   finasteride 5 MG tablet Commonly known as: PROSCAR Take 1 tablet (5 mg total) by mouth daily.   latanoprost 0.005 % ophthalmic solution Commonly known as: Harrel Lemon  Place 1 drop into both eyes at bedtime.   multivitamin with minerals Tabs tablet Take 1 tablet by mouth daily.   omeprazole 20 MG capsule Commonly known as: PRILOSEC TAKE 1 CAPSULE BY MOUTH DAILY   PROSTATE PO Take by mouth.   rosuvastatin 5 MG tablet Commonly known as: CRESTOR Take 1 tablet (5 mg total) by mouth 2 (two) times a week.   sulfamethoxazole-trimethoprim 800-160 MG tablet Commonly known as: BACTRIM DS Take 1 tablet by mouth every 12 (twelve) hours. Started by: Michiel Cowboy   tamsulosin 0.4 MG Caps capsule Commonly known as:  FLOMAX Take 1 capsule (0.4 mg total) by mouth daily.        Allergies:  Allergies  Allergen Reactions   Codeine Other (See Comments)    Family History: Family History  Problem Relation Age of Onset   ALS Mother     Social History:  reports that he has never smoked. He has never been exposed to tobacco smoke. He has never used smokeless tobacco. He reports that he does not drink alcohol and does not use drugs.  ROS: Pertinent ROS in HPI  Physical Exam: BP 134/76   Pulse (!) 137   Constitutional:  Well nourished. Alert and oriented, No acute distress. HEENT: [place] AT, moist mucus membranes.  Trachea midline Cardiovascular: No clubbing, cyanosis, or edema. Respiratory: Normal respiratory effort, no increased work of breathing. GU: No CVA tenderness.  No bladder fullness or masses.  Patient with uncircumcised phallus.  Foreskin easily retracted  Urethral meatus is patent.  No penile discharge. No penile lesions or rashes. Scrotum without lesions, cysts, rashes and/or edema.   Neurologic: Grossly intact, no focal deficits, moving all 4 extremities. Psychiatric: Normal mood and affect.  Laboratory Data: Lab Results  Component Value Date   WBC 5.9 07/28/2022   HGB 14.0 07/28/2022   HCT 43.3 07/28/2022   MCV 83.6 07/28/2022   PLT 121 (L) 07/28/2022   Lab Results  Component Value Date   CREATININE 1.23 (H) 07/28/2022   Lab Results  Component Value Date   PSA 2.69 07/28/2022   PSA 2.85 07/22/2021   PSA 3.00 05/22/2020   Lab Results  Component Value Date   HGBA1C 5.7 (H) 07/28/2022   Lab Results  Component Value Date   TSH 4.56 (H) 07/28/2022      Component Value Date/Time   CHOL 146 07/28/2022 0908   HDL 54 07/28/2022 0908   CHOLHDL 2.7 07/28/2022 0908   LDLCALC 76 07/28/2022 0908   Lab Results  Component Value Date   AST 14 07/28/2022   Lab Results  Component Value Date   ALT 10 07/28/2022   Urinalysis See HPI and EPIC I have reviewed the  labs.   Pertinent Imaging: N/A  Cath Change/ Replacement  Patient is present today for a catheter change due to urinary retention.  9 ml of water was removed from the balloon, a 16 FR Silastic foley cath was removed without difficulty.  Patient was cleaned and prepped in a sterile fashion with betadine and 2% lidocaine jelly was instilled into the urethra.  A 16 FR Silastic foley cath was replaced into the bladder, no complications were noted.  Foreskin pulled over the glans. Urine return was noted 50  ml and urine was yellow  in color. The balloon was filled with 10ml of sterile water. A leg bag was attached for drainage.  A night bag was also given to the patient and patient was given instruction on how  to change from one bag to another. Patient was given proper instruction on catheter care.    Performed by: Michiel Cowboy, PA-C   Assessment & Plan:    1. Suspected UTI -UA grossly infected  -Urine culture pending -Started empirically on Septra DS, will adjust if necessary once urine culture and sensitivity results are available    2. BPH with retention -Foley exchanged today  Return for keep follow up appointment with Sam .  These notes generated with voice recognition software. I apologize for typographical errors.  Cloretta Ned  [facility] [address]  [address] [place], [place] [address] [phone]

## 2023-01-04 NOTE — Telephone Encounter (Signed)
Patient granddaughter called in today and states her grandpa is having some bladder pain . Level 3 pain . The urine is draining into the bag, no fever or chills. Patient just think he needs more medication because he thinks he is having another bladder infections.

## 2023-01-04 NOTE — Progress Notes (Signed)
Catheter Removal  Patient is present today for a catheter removal.  10ml of water was drained from the balloon. A 16FR foley cath was removed from the bladder, no complications were noted. Patient tolerated well.  Performed by: Randa Lynn, RMA  Follow up/ Additional notes: No follow-ups on file.

## 2023-01-07 LAB — CULTURE, URINE COMPREHENSIVE

## 2023-01-13 ENCOUNTER — Other Ambulatory Visit: Payer: Self-pay | Admitting: Urology

## 2023-01-14 ENCOUNTER — Other Ambulatory Visit: Payer: Self-pay | Admitting: Family Medicine

## 2023-01-14 DIAGNOSIS — F5101 Primary insomnia: Secondary | ICD-10-CM

## 2023-01-17 NOTE — Telephone Encounter (Signed)
Requested medication (s) are due for refill today: yes  Requested medication (s) are on the active medication list: yes  Last refill:  07/28/22 #30 2 RF  Future visit scheduled:no  Notes to clinic:  med not delegated to a protocol   Requested Prescriptions  Pending Prescriptions Disp Refills   BELSOMRA 20 MG TABS [Pharmacy Med Name: BELSOMRA 20MG  TABLETS] 30 tablet     Sig: TAKE 1/2 TO 1 TABLET(10 TO 20 MG) BY MOUTH AT BEDTIME AS NEEDED     Off-Protocol Failed - 01/14/2023  2:03 PM      Failed - Medication not assigned to a protocol, review manually.      Passed - Valid encounter within last 12 months    Recent Outpatient Visits           5 months ago Annual physical exam   [facility] [place], Netta Neat, DO   1 year ago Acute left flank pain   [place] [facility] Smitty Cords, DO   1 year ago Annual physical exam   [place] Smitty Cords, DO   1 year ago Acute non-recurrent frontal sinusitis   [place] [facility] Smitty Cords, DO   1 year ago Primary insomnia   [place] [facility] [place], Netta Neat, [place]

## 2023-01-20 ENCOUNTER — Encounter: Payer: Self-pay | Admitting: Physician Assistant

## 2023-01-20 ENCOUNTER — Ambulatory Visit: Payer: Medicare Other | Admitting: Physician Assistant

## 2023-01-20 VITALS — BP 140/88 | HR 102

## 2023-01-20 DIAGNOSIS — R339 Retention of urine, unspecified: Secondary | ICD-10-CM | POA: Diagnosis not present

## 2023-01-20 DIAGNOSIS — Z466 Encounter for fitting and adjustment of urinary device: Secondary | ICD-10-CM

## 2023-01-20 NOTE — Progress Notes (Signed)
Cath Change/ Replacement  Patient is present today for a catheter change due to urinary retention.  8ml of water was removed from the balloon, a 16FR Silastic foley cath was removed without difficulty.  Patient was cleaned and prepped in a sterile fashion with betadine and 2% lidocaine jelly was instilled into the urethra. A 16 FR Silicone coude foley cath was replaced into the bladder, no complications were noted. Urine return was noted 5ml and urine was yellow in color. The balloon was filled with 10ml of sterile water. A leg bag was attached for drainage.  Patient tolerated well.    Performed by: Carman Ching, PA-C   Follow up: Return in about 4 weeks (around 02/17/2023) for Catheter exchange.

## 2023-02-07 ENCOUNTER — Encounter: Payer: Self-pay | Admitting: Family Medicine

## 2023-02-07 NOTE — Telephone Encounter (Signed)
 pt's granddaughter confirmed urine flow into cath bag. Grand daughter was informed that when pt's have a catheter, their urine can have a smell to it, as long as the pt isn't having fevers or chills or blood in urine. Pt was advise to push cath bag tightly into the end of the catheter. She voiced understanding.

## 2023-02-16 ENCOUNTER — Ambulatory Visit: Payer: Medicare Other | Admitting: Physician Assistant

## 2023-02-16 VITALS — BP 142/76 | HR 144 | Ht 70.0 in | Wt 162.5 lb

## 2023-02-16 DIAGNOSIS — R339 Retention of urine, unspecified: Secondary | ICD-10-CM

## 2023-02-16 DIAGNOSIS — Z466 Encounter for fitting and adjustment of urinary device: Secondary | ICD-10-CM

## 2023-02-16 NOTE — Progress Notes (Signed)
 Cath Change/ Replacement  Patient is present today for a catheter change due to urinary retention.  7ml of water was removed from the balloon, a 16FR silicone coude foley cath was removed without difficulty.  Patient was cleaned and prepped in a sterile fashion with betadine and 2% lidocaine  jelly was instilled into the urethra. A 16 FR coude foley cath was replaced into the bladder, no complications were noted. Urine return was noted 10ml and urine was yellow in color. The balloon was filled with 10ml of sterile water. A leg bag was attached for drainage.  Patient tolerated well.    Performed by: Rut Betterton, PA-C   Additional notes: Patient preferred his silicone coude catheter this month, will continue to use this type.  Follow up: Return in about 4 weeks (around 03/16/2023) for Catheter exchange.

## 2023-02-17 ENCOUNTER — Encounter: Payer: Medicare Other | Admitting: Physician Assistant

## 2023-02-26 DIAGNOSIS — Z466 Encounter for fitting and adjustment of urinary device: Secondary | ICD-10-CM | POA: Diagnosis not present

## 2023-02-26 DIAGNOSIS — N2889 Other specified disorders of kidney and ureter: Secondary | ICD-10-CM | POA: Diagnosis not present

## 2023-03-16 ENCOUNTER — Ambulatory Visit: Payer: Medicare Other | Admitting: Physician Assistant

## 2023-03-16 DIAGNOSIS — R339 Retention of urine, unspecified: Secondary | ICD-10-CM

## 2023-03-16 DIAGNOSIS — Z466 Encounter for fitting and adjustment of urinary device: Secondary | ICD-10-CM

## 2023-03-16 NOTE — Patient Instructions (Signed)
 Hollister M9 products for catheter odor control

## 2023-03-16 NOTE — Progress Notes (Signed)
 Cath Change/ Replacement  Patient is present today for a catheter change due to urinary retention.  8ml of water was removed from the balloon, a 16FR coude silicone foley cath was removed without difficulty.  Patient was cleaned and prepped in a sterile fashion with betadine and 2% lidocaine  jelly was instilled into the urethra. A 16 Fr coude silicone foley cath was replaced into the bladder, no complications were noted. Urine return was noted 15ml and urine was yellow in color. The balloon was filled with 10ml of sterile water. A leg bag was attached for drainage.  Patient tolerated well.    Performed by: Jordyan Hardiman, PA-C   Follow up: Return in about 4 weeks (around 04/13/2023) for Catheter exchange.

## 2023-03-30 ENCOUNTER — Ambulatory Visit: Payer: Medicare Other | Admitting: Physician Assistant

## 2023-03-30 DIAGNOSIS — R339 Retention of urine, unspecified: Secondary | ICD-10-CM | POA: Diagnosis not present

## 2023-03-30 DIAGNOSIS — R3989 Other symptoms and signs involving the genitourinary system: Secondary | ICD-10-CM | POA: Diagnosis not present

## 2023-03-30 LAB — URINALYSIS, COMPLETE
Bilirubin, UA: NEGATIVE
Glucose, UA: NEGATIVE
Ketones, UA: NEGATIVE
Nitrite, UA: POSITIVE — AB
Specific Gravity, UA: 1.025 (ref 1.005–1.030)
Urobilinogen, Ur: 0.2 mg/dL (ref 0.2–1.0)
pH, UA: 6 (ref 5.0–7.5)

## 2023-03-30 LAB — MICROSCOPIC EXAMINATION: WBC, UA: >30 /[HPF] — AB (ref 0–5)

## 2023-03-30 MED ORDER — DOXYCYCLINE HYCLATE 100 MG PO CAPS: 100.0000 mg | ORAL_CAPSULE | Freq: Two times a day (BID) | ORAL | 0 refills | Status: AC

## 2023-03-30 NOTE — Progress Notes (Signed)
03/30/2023 3:06 PM   Scott Ashley [birth year] [id number]  CC: Chief Complaint  Patient presents with   Urinary Urgency   HPI: Scott Ashley is a 89 y.o. male with PMH BPH with prostatomegaly, urinary retention on Flomax and finasteride managed with chronic Foley, and small left renal mass who presents today for evaluation of possible Foley malfunction. He is accompanied today by his granddaughter, Idalia Needle, who contributes to HPI.   Today he reports onset of constant urinary urgency yesterday. He denies fevers. Foley is still draining. Idalia Needle has noticed increasingly malodorous urine.   PMH: Past Medical History:  Diagnosis Date   Anxiety    Glaucoma    Hypertension    Urinary incontinence     Surgical History: Past Surgical History:  Procedure Laterality Date   COLONOSCOPY WITH PROPOFOL N/A 10/10/2017   Procedure: COLONOSCOPY WITH PROPOFOL;  Surgeon: Midge Minium, MD;  Location: [facility] ENDOSCOPY;  Service: Endoscopy;  Laterality: N/A;   ESOPHAGOGASTRODUODENOSCOPY (EGD) WITH PROPOFOL N/A 10/10/2017   Procedure: ESOPHAGOGASTRODUODENOSCOPY (EGD) WITH PROPOFOL;  Surgeon: Midge Minium, MD;  Location: [facility] END[facility];  Service: Endoscopy;  Laterality: N/A;   ESOPHAGOGASTRODUODENOSCOPY (EGD) WITH PROPOFOL N/A 10/06/2020   Procedure: ESOPHAGOGASTRODUODENOSCOPY (EGD) WITH PROPOFOL;  Surgeon: Midge Minium, MD;  Location: [facility] ENDOSCOPY;  Service: Endoscopy;  Laterality: N/A;   HERNIA REPAIR     twice   TONSILLECTOMY      Home Medications:  Allergies as of 03/30/2023       Reactions   Codeine Other (See Comments)        Medication List        Accurate as of March 30, 2023  3:06 PM. If you have any questions, ask your nurse or doctor.          Belsomra 20 MG Tabs Generic drug: Suvorexant TAKE 1/2 TO 1 TABLET(10 TO 20 MG) BY MOUTH AT BEDTIME AS NEEDED   doxycycline 100 MG capsule Commonly known as: VIBRAMYCIN Take 1 capsule (100 mg total) by mouth 2 (two) times daily  for 7 days.   finasteride 5 MG tablet Commonly known as: PROSCAR TAKE 1 TABLET(5 MG) BY MOUTH DAILY   latanoprost 0.005 % ophthalmic solution Commonly known as: XALATAN Place 1 drop into both eyes at bedtime.   multivitamin with minerals Tabs tablet Take 1 tablet by mouth daily.   omeprazole 20 MG capsule Commonly known as: PRILOSEC TAKE 1 CAPSULE BY MOUTH DAILY   PROSTATE PO Take by mouth.   rosuvastatin 5 MG tablet Commonly known as: CRESTOR Take 1 tablet (5 mg total) by mouth 2 (two) times a week.   tamsulosin 0.4 MG Caps capsule Commonly known as: FLOMAX Take 1 capsule (0.4 mg total) by mouth daily.        Allergies:  Allergies  Allergen Reactions   Codeine Other (See Comments)    Family History: Family History  Problem Relation Age of Onset   ALS Mother     Social History:   reports that he has never smoked. He has never been exposed to tobacco smoke. He has never used smokeless tobacco. He reports that he does not drink alcohol and does not use drugs.  Physical Exam: There were no vitals taken for this visit.  Constitutional:  Alert and oriented, no acute distress, nontoxic appearing HEENT: [place], AT Cardiovascular: No clubbing, cyanosis, or edema Respiratory: Normal respiratory effort, no increased work of breathing GU: Foley in place draining yellow urine. Nonpalpable bladder. Skin: No rashes, bruises or suspicious  lesions Neurologic: Grossly intact, no focal deficits, moving all 4 extremities Psychiatric: Normal mood and affect  Cath Change/ Replacement  Patient is present today for a catheter change due to urinary retention.  8ml of water was removed from the balloon, a 16FR coude silicone foley cath was removed without difficulty.  Patient was cleaned and prepped in a sterile fashion with betadine and 2% lidocaine jelly was instilled into the urethra. A 16 FR coude silicone foley cath was replaced into the bladder, no complications were noted. Urine  return was noted 60ml and urine was yellow in color. The balloon was filled with 10ml of sterile water. A leg bag was attached for drainage.  Patient tolerated well.    Assessment & Plan:   1. Suspected UTI (Primary) Foley changed as above. Urine output with exchange was reasonable, low suspicion for catheter occlusion. Suspect UTI. I obtained a urine sample from his new catheter for UA/culture and will start Doxy empirically. - Urinalysis, Complete - CULTURE, URINE COMPREHENSIVE - doxycycline (VIBRAMYCIN) 100 MG capsule; Take 1 capsule (100 mg total) by mouth 2 (two) times daily for 7 days.  Dispense: 14 capsule; Refill: 0  Return in about 4 weeks (around 04/27/2023) for Catheter exchange.  Carman Ching, PA-C  [facility] [place] [address] [place], [place] [address] [phone]

## 2023-04-02 ENCOUNTER — Encounter: Payer: Self-pay | Admitting: Family Medicine

## 2023-04-02 DIAGNOSIS — R42 Dizziness and giddiness: Secondary | ICD-10-CM

## 2023-04-04 ENCOUNTER — Other Ambulatory Visit: Payer: Self-pay | Admitting: Family Medicine

## 2023-04-04 DIAGNOSIS — N401 Enlarged prostate with lower urinary tract symptoms: Secondary | ICD-10-CM

## 2023-04-04 LAB — CULTURE, URINE COMPREHENSIVE

## 2023-04-04 MED ORDER — MECLIZINE HCL 25 MG PO TABS
25.0000 mg | ORAL_TABLET | Freq: Three times a day (TID) | ORAL | 1 refills | Status: AC | PRN
Start: 2023-04-04 — End: ?

## 2023-04-05 NOTE — Telephone Encounter (Signed)
 a replace/new response with 100-Day Supply not appropriate at this time. Requested Prescriptions  Pending Prescriptions Disp Refills   tamsulosin (FLOMAX) 0.4 MG CAPS capsule [Pharmacy Med Name: Tamsulosin HCl 0.4 MG Oral Capsule] 100 capsule 2    Sig: TAKE 1 CAPSULE BY MOUTH DAILY     Urology: Alpha-Adrenergic Blocker Failed - 04/05/2023  2:41 PM      Failed - Last BP in normal range    BP Readings from Last 1 Encounters:  02/16/23 (!) 142/76         Passed - PSA in normal range and within 360 days    PSA  Date Value Ref Range Status  07/28/2022 2.69 < OR = 4.00 ng/mL Final    Comment:    The total PSA value from this assay system is  standardized against the WHO standard. The test  result will be approximately 20% lower when compared  to the equimolar-standardized total PSA (Beckman  Coulter). Comparison of serial PSA results should be  interpreted with this fact in mind. . This test was performed using the Siemens  chemiluminescent method. Values obtained from  different assay methods cannot be used interchangeably. PSA levels, regardless of value, should not be interpreted as absolute evidence of the presence or absence of disease.          Passed - Valid encounter within last 12 months    Recent Outpatient Visits           8 months ago Annual physical exam   [facility] [place], Netta Neat, DO   1 year ago Acute left flank pain   [place] [facility] Smitty Cords, DO   1 year ago Annual physical exam   [place] [facility] Smitty Cords, DO   2 years ago Acute non-recurrent frontal sinusitis   [place] [facility] Smitty Cords, DO   2 years ago Primary insomnia   [facility] Smitty Cords, DO       Future Appointments             In 3 weeks Carman Ching, [place] [facility]  [place]

## 2023-04-12 ENCOUNTER — Ambulatory Visit: Payer: Medicare Other | Admitting: Physician Assistant

## 2023-04-19 ENCOUNTER — Encounter: Payer: Self-pay | Admitting: Family Medicine

## 2023-04-19 ENCOUNTER — Ambulatory Visit (INDEPENDENT_AMBULATORY_CARE_PROVIDER_SITE_OTHER): Admitting: Family Medicine

## 2023-04-19 VITALS — BP 132/84 | HR 64 | Ht 70.0 in | Wt 169.0 lb

## 2023-04-19 DIAGNOSIS — H8190 Unspecified disorder of vestibular function, unspecified ear: Secondary | ICD-10-CM

## 2023-04-19 DIAGNOSIS — R42 Dizziness and giddiness: Secondary | ICD-10-CM | POA: Diagnosis not present

## 2023-04-19 DIAGNOSIS — J0110 Acute frontal sinusitis, unspecified: Secondary | ICD-10-CM

## 2023-04-19 MED ORDER — AMOXICILLIN-POT CLAVULANATE 875-125 MG PO TABS
1.0000 | ORAL_TABLET | Freq: Two times a day (BID) | ORAL | 0 refills | Status: DC
Start: 2023-04-19 — End: 2023-05-25

## 2023-04-19 NOTE — Progress Notes (Signed)
 Subjective:    Patient ID: Scott Ashley, male    DOB: 29-Nov-1929, 89 y.o.   MRN: [id number]  Scott Ashley is a 89 y.o. male presenting on 04/19/2023 for Dizziness (Lightheadedness)   HPI  Discussed the use of AI scribe software for clinical note transcription with the patient, who gave verbal consent to proceed.  Accompanied by granddaughter Juliane Poot  History of Present Illness   He presents with lightheadedness and sinus symptoms.  Reports initially end of December 2024 had some swimmy head, worse when wakes up, tried taking Dramamine regularly and also tried Vertigo exercises Epley Maneuever at home. Limited results, then in February 2025, we sent rx Meclizine for symptoms AS NEEDED.  He says worse with laying down. Often he can do well with normal activity and up and walking. Usually has episodic symptoms, not constant only. Now described primarily as lightheadedness not dizziness but sinus pain or discomfort "like a sinus infection"  He been performing exercises previously recommended for vertigo but finds them ineffective. Dramamine has not provided relief, and the symptoms have worsened over time. No lightheadedness or dizziness when turning his head or changing positions, except when lying down.  In the last three days, he has developed symptoms resembling a sinus infection, including a burning sensation in the nose and sinus area. No fever, cough, or shortness of breath, but he experiences occasional congestion, which he describes as a daily occurrence. Tylenol provides temporary relief, but symptoms return when the medication wears off. Symptoms worsen when lying down and improve upon sitting up or standing.  He has been using Flomax (tamsulosin) since 2015 and is currently on an indwelling catheter will be changed by Urology weekly. He questions whether Flomax could be contributing to his lightheadedness, as he has been on it for a long time. His balance has been poor, but  there has been no recent change in this condition.  He has been using space heaters for extra warmth, which may be contributing to dryness in the air.         04/19/2023    3:04 PM 08/18/2022    1:31 PM 07/28/2022    8:42 AM  Depression screen PHQ 2/9  Decreased Interest 0 0 0  Down, Depressed, Hopeless 0 0 0  PHQ - 2 Score 0 0 0  Altered sleeping 0 0   Tired, decreased energy 0 0   Change in appetite 0 0   Feeling bad or failure about yourself  0 0   Trouble concentrating 0 0   Moving slowly or fidgety/restless 0 0   Suicidal thoughts 0 0   PHQ-9 Score 0 0   Difficult doing work/chores  Not difficult at all        04/19/2023    3:04 PM 07/28/2022    8:42 AM 07/22/2021   10:02 AM 07/22/2021    8:43 AM  GAD 7 : Generalized Anxiety Score  Nervous, Anxious, on Edge 0 2 1 1   Control/stop worrying 0 0 0 0  Worry too much - different things 0 0 0 0  Trouble relaxing 0 0 0 0  Restless 0 0 0 0  Easily annoyed or irritable 0 0 0 0  Afraid - awful might happen  0 0 0  Total GAD 7 Score  2 1 1   Anxiety Difficulty   Not difficult at all Not difficult at all    Social History   Tobacco Use   Smoking status: Never  Passive exposure: Never   Smokeless tobacco: Never  Vaping Use   Vaping status: Never Used  Substance Use Topics   Alcohol use: Never   Drug use: Never    Review of Systems Per HPI unless specifically indicated above     Objective:    BP 132/84   Pulse 64   Ht 5\' 10"  (1.778 m)   Wt 169 lb (76.7 kg)   BMI 24.25 kg/m   Wt Readings from Last 3 Encounters:  04/19/23 169 lb (76.7 kg)  02/16/23 162 lb 8 oz (73.7 kg)  12/16/22 158 lb (71.7 kg)    Orthostatic Vitals 126/82 laying 144/88 sitting 118/74 standing    Physical Exam Vitals and nursing note reviewed.  Constitutional:      General: He is not in acute distress.    Appearance: Normal appearance. He is well-developed. He is not diaphoretic.     Comments: Well-appearing, comfortable,  cooperative  HENT:     Head: Normocephalic and atraumatic.     Right Ear: There is impacted cerumen.     Left Ear: There is impacted cerumen.  Eyes:     General:        Right eye: No discharge.        Left eye: No discharge.     Conjunctiva/sclera: Conjunctivae normal.  Cardiovascular:     Rate and Rhythm: Normal rate.  Pulmonary:     Effort: Pulmonary effort is normal.  Skin:    General: Skin is warm and dry.     Findings: No erythema or rash.  Neurological:     Mental Status: He is alert and oriented to person, place, and time.  Psychiatric:        Mood and Affect: Mood normal.        Behavior: Behavior normal.        Thought Content: Thought content normal.     Comments: Well groomed, good eye contact, normal speech and thoughts     Results for orders placed or performed in visit on 03/30/23  Microscopic Examination   Collection Time: 03/30/23  2:58 PM   Urine  Result Value Ref Range   WBC, UA >30 (A) 0 - 5 /hpf   RBC, Urine 3-10 (A) 0 - 2 /hpf   Epithelial Cells (non renal) 0-10 0 - 10 /hpf   Bacteria, UA Many (A) None seen/Few  Urinalysis, Complete   Collection Time: 03/30/23  2:58 PM  Result Value Ref Range   Specific Gravity, UA 1.025 1.005 - 1.030   pH, UA 6.0 5.0 - 7.5   Color, UA Yellow Yellow   Appearance Ur Cloudy (A) Clear   Leukocytes,UA 2+ (A) Negative   Protein,UA 1+ (A) Negative/Trace   Glucose, UA Negative Negative   Ketones, UA Negative Negative   RBC, UA Trace (A) Negative   Bilirubin, UA Negative Negative   Urobilinogen, Ur 0.2 0.2 - 1.0 mg/dL   Nitrite, UA Positive (A) Negative   Microscopic Examination See below:   CULTURE, URINE COMPREHENSIVE   Collection Time: 03/30/23  3:02 PM   Specimen: Urine   UR  Result Value Ref Range   Urine Culture, Comprehensive Final report (A)    Organism ID, Bacteria Escherichia coli (A)    Organism ID, Bacteria Not applicable    ANTIMICROBIAL SUSCEPTIBILITY Comment       Assessment & Plan:    Problem List Items Addressed This Visit   None Visit Diagnoses       Episodic  lightheadedness    -  Primary     Acute non-recurrent frontal sinusitis       Relevant Medications   amoxicillin-clavulanate (AUGMENTIN) 875-125 MG tablet     Vestibular dysfunction, unspecified laterality             Lightheadedness Intermittent lightheadedness, worsened when supine. Possible causes include tamsulosin side effect, orthostatic hypotension, or sinus issues. Tamsulosin sensitivity may increase with age. Sinus infection unlikely due to lack of symptoms. Dramamine and meclizine ineffective. Vestibular therapy considered if symptoms persist.  - Perform orthostatic blood pressure measurements (standing, seated, supine). Negative results - Discuss potential discontinuation of tamsulosin with urologist. - Consider vestibular therapy if symptoms persist after addressing medication and sinus concerns. - Prescribe Augmentin as a conditional treatment for possible sinus infection. - Advise caution with sudden position changes.  Benign Prostatic Hyperplasia (BPH) Long-standing BPH managed with tamsulosin and finasteride. Currently on catheter, reducing need for tamsulosin. Discussion with urologist planned. - Discuss with urologist the potential discontinuation of tamsulosin due to catheter use and possible side effects.     CC chart to [facility]   No orders of the defined types were placed in this encounter.   Meds ordered this encounter  Medications   amoxicillin-clavulanate (AUGMENTIN) 875-125 MG tablet    Sig: Take 1 tablet by mouth 2 (two) times daily.    Dispense:  20 tablet    Refill:  0    Follow up plan: Return if symptoms worsen or fail to improve.   Saralyn Pilar, DO [facility] 04/19/2023, 3:10 PM

## 2023-04-19 NOTE — Patient Instructions (Addendum)
 Thank you for coming to the office today.  We will ask your Urologist to check on the status with the Tamsulosin if this is causing side effect lightheadedness. Stay tuned for updates from them.  Caution with sudden position changes standing laying sitting etc.  Orthostatic vitals today  BP sitting is normal.  Not required to keep taking Dramamine or Meclizine if not helping.  Same with the Vertigo maneuvers, can stop doing them if not helping.  Try the antibiotic augmentin if not improved for possible sinus infection.  Please schedule a Follow-up Appointment to: Return if symptoms worsen or fail to improve.  If you have any other questions or concerns, please feel free to call the office or send a message through MyChart. You may also schedule an earlier appointment if necessary.  Additionally, you may be receiving a survey about your experience at our office within a few days to 1 week by e-mail or mail. We value your feedback.  Saralyn Pilar, DO [facility], [place]

## 2023-04-26 ENCOUNTER — Encounter: Payer: Self-pay | Admitting: Physician Assistant

## 2023-04-26 ENCOUNTER — Encounter: Payer: Self-pay | Admitting: Family Medicine

## 2023-04-26 ENCOUNTER — Ambulatory Visit: Payer: Medicare Other | Admitting: Physician Assistant

## 2023-04-26 VITALS — BP 168/75 | HR 96

## 2023-04-26 DIAGNOSIS — Z466 Encounter for fitting and adjustment of urinary device: Secondary | ICD-10-CM

## 2023-04-26 DIAGNOSIS — I10 Essential (primary) hypertension: Secondary | ICD-10-CM

## 2023-04-26 DIAGNOSIS — N2889 Other specified disorders of kidney and ureter: Secondary | ICD-10-CM | POA: Diagnosis not present

## 2023-04-26 NOTE — Progress Notes (Signed)
 Cath Change/ Replacement  Patient is present today for a catheter change due to urinary retention.  8ml of water was removed from the balloon, a 16FR silicone coude foley cath was removed without difficulty.  Patient was cleaned and prepped in a sterile fashion with betadine and 2% lidocaine jelly was instilled into the urethra. A 16 FR silicone coude foley cath was replaced into the bladder, no complications were noted. Urine return was noted 5ml and urine was yellow in color. The balloon was filled with 10ml of sterile water. A leg bag was attached for drainage.  Patient tolerated well.    Performed by: Carman Ching, PA-C  Additional notes: Stopped Flomax yesterday, will continue finasteride. We discussed repeating imaging for his left renal mass, however patient declined, citing his age. We discussed the risk of deferring imaging including not catching a worsening renal cancer. He understands this risk and declines imaging.  Follow up: Return in about 4 weeks (around 05/24/2023) for Catheter exchange.

## 2023-04-27 ENCOUNTER — Other Ambulatory Visit: Payer: Self-pay | Admitting: Family Medicine

## 2023-04-27 DIAGNOSIS — I10 Essential (primary) hypertension: Secondary | ICD-10-CM

## 2023-04-27 MED ORDER — AMLODIPINE BESYLATE 5 MG PO TABS: 5.0000 mg | ORAL_TABLET | Freq: Every day | ORAL | 0 refills | Status: DC

## 2023-04-27 NOTE — Addendum Note (Signed)
 Addended by: Smitty Cords on: 04/27/2023 12:57 PM   Modules accepted: Orders

## 2023-04-28 NOTE — Telephone Encounter (Signed)
 90 day supply not appropriate at this time, new order. Requested Prescriptions  Pending Prescriptions Disp Refills   amLODipine (NORVASC) 5 MG tablet [Pharmacy Med Name: AMLODIPINE BESYLATE 5MG  TABLETS] 90 tablet     Sig: TAKE 1 TABLET(5 MG) BY MOUTH DAILY     Cardiovascular: Calcium Channel Blockers 2 Failed - 04/28/2023 12:28 PM      Failed - Last BP in normal range    BP Readings from Last 1 Encounters:  04/26/23 (!) 168/75         Failed - Valid encounter within last 6 months    Recent Outpatient Visits           9 months ago Annual physical exam   [place] [facility] Smitty Cords, DO   1 year ago Acute left flank pain   [place] [facility] Smitty Cords, DO   1 year ago Annual physical exam   [place] [facility] Smitty Cords, DO   2 years ago Acute non-recurrent frontal sinusitis   [place] [facility] Smitty Cords, DO   2 years ago Primary insomnia   [facility] Smitty Cords, DO       Future Appointments             In 1 month Carman Ching, PA-C [facility] [place]            Passed - Last Heart Rate in normal range    Pulse Readings from Last 1 Encounters:  04/26/23 96

## 2023-05-22 ENCOUNTER — Other Ambulatory Visit: Payer: Self-pay | Admitting: Family Medicine

## 2023-05-22 DIAGNOSIS — F5101 Primary insomnia: Secondary | ICD-10-CM

## 2023-05-23 NOTE — Telephone Encounter (Signed)
 Requested medications are due for refill today.  yes  Requested medications are on the active medications list.  yes  Last refill. 01/17/2023 #30 2 rf  Future visit scheduled.   yes  Notes to clinic.  Medication not assigned to a protocol. Please review for refill.    Requested Prescriptions  Pending Prescriptions Disp Refills   BELSOMRA 20 MG TABS [Pharmacy Med Name: BELSOMRA 20MG  TABLETS] 30 tablet     Sig: TAKE 1/2 TO 1 TABLET(10 TO 20 MG) BY MOUTH AT BEDTIME AS NEEDED     Off-Protocol Failed - 05/23/2023  3:55 PM      Failed - Medication not assigned to a protocol, review manually.      Failed - Valid encounter within last 12 months    Recent Outpatient Visits           1 month ago Episodic lightheadedness   [facility] [place], Kayleen Party, DO       Future Appointments             In 2 days Kathreen Pare, PA-C [facility] [place]

## 2023-05-25 ENCOUNTER — Ambulatory Visit: Admitting: Physician Assistant

## 2023-05-25 VITALS — BP 147/66 | HR 75 | Ht 70.0 in | Wt 169.0 lb

## 2023-05-25 DIAGNOSIS — Z466 Encounter for fitting and adjustment of urinary device: Secondary | ICD-10-CM

## 2023-05-25 NOTE — Progress Notes (Signed)
 Cath Change/ Replacement  Patient is present today for a catheter change due to urinary retention.  8ml of water was removed from the balloon, a 16FR silicone coud foley cath was removed without difficulty.  Patient was cleaned and prepped in a sterile fashion with betadine and 2% lidocaine jelly was instilled into the urethra. A 16 FR silicone coud foley cath was replaced into the bladder, no complications were noted. Urine return was noted 15ml and urine was yellow in color. The balloon was filled with 10ml of sterile water. A leg bag was attached for drainage.  Patient tolerated well.    Performed by: Jocsan Mcginley, PA-C   Follow up: Return in about 4 weeks (around 06/22/2023) for Catheter exchange.

## 2023-05-29 ENCOUNTER — Ambulatory Visit: Admitting: Physician Assistant

## 2023-05-31 ENCOUNTER — Other Ambulatory Visit: Payer: Self-pay | Admitting: Family Medicine

## 2023-05-31 DIAGNOSIS — N138 Other obstructive and reflux uropathy: Secondary | ICD-10-CM

## 2023-05-31 DIAGNOSIS — N401 Enlarged prostate with lower urinary tract symptoms: Secondary | ICD-10-CM

## 2023-06-01 ENCOUNTER — Encounter: Payer: Self-pay | Admitting: Family Medicine

## 2023-06-01 DIAGNOSIS — R42 Dizziness and giddiness: Secondary | ICD-10-CM

## 2023-06-01 DIAGNOSIS — H8190 Unspecified disorder of vestibular function, unspecified ear: Secondary | ICD-10-CM

## 2023-06-01 NOTE — Telephone Encounter (Signed)
 The original prescription was discontinued on 05/25/2023 by Marnie Siren, CMA.   Requested Prescriptions  Pending Prescriptions Disp Refills   tamsulosin  (FLOMAX ) 0.4 MG CAPS capsule [Pharmacy Med Name: Tamsulosin  HCl 0.4 MG Oral Capsule] 60 capsule 5    Sig: TAKE 1 CAPSULE BY MOUTH DAILY     Urology: Alpha-Adrenergic Blocker Failed - 06/01/2023  5:20 PM      Failed - Last BP in normal range    BP Readings from Last 1 Encounters:  05/25/23 (!) 147/66         Failed - Valid encounter within last 12 months    Recent Outpatient Visits           1 month ago Episodic lightheadedness   [place] [facility] [place], Kayleen Party, DO       Future Appointments             In 3 weeks Kathreen Pare, PA-C [facility] [place]            Passed - PSA in normal range and within 360 days    PSA  Date Value Ref Range Status  07/28/2022 2.69 < OR = 4.00 ng/mL Final    Comment:    The total PSA value from this assay system is  standardized against the WHO standard. The test  result will be approximately 20% lower when compared  to the equimolar-standardized total PSA (Beckman  Coulter). Comparison of serial PSA results should be  interpreted with this fact in mind. . This test was performed using the Siemens  chemiluminescent method. Values obtained from  different assay methods cannot be used interchangeably. PSA levels, regardless of value, should not be interpreted as absolute evidence of the presence or absence of disease.

## 2023-06-20 ENCOUNTER — Ambulatory Visit

## 2023-06-23 ENCOUNTER — Ambulatory Visit: Admitting: Physician Assistant

## 2023-06-23 DIAGNOSIS — Z466 Encounter for fitting and adjustment of urinary device: Secondary | ICD-10-CM | POA: Diagnosis not present

## 2023-06-23 NOTE — Progress Notes (Signed)
 Cath Change/ Replacement  Patient is present today for a catheter change due to urinary retention.  8ml of water was removed from the balloon, a 16FR silicone coude foley cath was removed without difficulty.  Patient was cleaned and prepped in a sterile fashion with betadine and 2% lidocaine  jelly was instilled into the urethra. A 16 FR silicone foley cath was replaced into the bladder, no complications were noted. Urine return was noted 10ml and urine was yellow in color. The balloon was filled with 10ml of sterile water. A leg bag was attached for drainage.  Patient tolerated well.    Performed by: Jadin Kagel, PA-C   Follow up: Return in about 4 weeks (around 07/21/2023) for Catheter exchange.

## 2023-06-26 ENCOUNTER — Ambulatory Visit: Admitting: Physical Therapy

## 2023-06-26 ENCOUNTER — Ambulatory Visit: Admitting: Physician Assistant

## 2023-06-26 VITALS — BP 163/88 | HR 68 | Ht 72.0 in | Wt 169.0 lb

## 2023-06-26 DIAGNOSIS — Z466 Encounter for fitting and adjustment of urinary device: Secondary | ICD-10-CM | POA: Diagnosis not present

## 2023-06-26 LAB — BLADDER SCAN AMB NON-IMAGING

## 2023-06-26 NOTE — Progress Notes (Signed)
 Patient reports persistent urinary urgency since Foley exchange 3 days ago, no fevers or pain. He feels the catheter is pulling on him. On physical exam, it is pulled taut, which would explain his discomfort. I offered him a Foley change and he agreed, see below.  Cath Change/ Replacement  Patient is present today for a catheter change due to urinary retention.  9ml of water was removed from the balloon, a 16FR silicone foley cath was removed without difficulty.  Patient was cleaned and prepped in a sterile fashion with betadine and 2% lidocaine  jelly was instilled into the urethra. A 16 FR Silastic foley cath was replaced into the bladder, no complications were noted. Urine return was noted 30ml and urine was yellow in color. The balloon was filled with 10ml of sterile water. A leg bag was attached for drainage.  Patient tolerated well.    Performed by: Jonh Mcqueary, PA-C   Follow up: Return for Keep follow-up as scheduled.

## 2023-06-26 NOTE — Telephone Encounter (Signed)
 Added pt for 3:40 today

## 2023-06-27 DIAGNOSIS — H6123 Impacted cerumen, bilateral: Secondary | ICD-10-CM | POA: Diagnosis not present

## 2023-06-27 DIAGNOSIS — R42 Dizziness and giddiness: Secondary | ICD-10-CM | POA: Diagnosis not present

## 2023-06-30 ENCOUNTER — Other Ambulatory Visit: Payer: Self-pay | Admitting: Family Medicine

## 2023-07-02 ENCOUNTER — Other Ambulatory Visit: Payer: Self-pay | Admitting: Family Medicine

## 2023-07-02 DIAGNOSIS — I10 Essential (primary) hypertension: Secondary | ICD-10-CM

## 2023-07-04 NOTE — Telephone Encounter (Signed)
 Requested Prescriptions  Refused Prescriptions Disp Refills   omeprazole  (PRILOSEC) 20 MG capsule [Pharmacy Med Name: Omeprazole  20 MG Oral Capsule Delayed Release] 100 capsule 2    Sig: TAKE 1 CAPSULE BY MOUTH DAILY     Gastroenterology: Proton Pump Inhibitors Failed - 07/04/2023  3:09 PM      Failed - Valid encounter within last 12 months    Recent Outpatient Visits           2 months ago Episodic lightheadedness   [place] [facility] [place], Kayleen Party, DO       Future Appointments             In 2 weeks Kathreen Pare, PA-C [facility] [place]

## 2023-07-05 ENCOUNTER — Other Ambulatory Visit: Payer: Self-pay | Admitting: Family Medicine

## 2023-07-05 ENCOUNTER — Ambulatory Visit

## 2023-07-05 DIAGNOSIS — I10 Essential (primary) hypertension: Secondary | ICD-10-CM

## 2023-07-05 NOTE — Telephone Encounter (Signed)
 Requested Prescriptions  Pending Prescriptions Disp Refills   amLODipine  (NORVASC ) 5 MG tablet [Pharmacy Med Name: AMLODIPINE  BESYLATE 5MG  TABLETS] 30 tablet 1    Sig: TAKE 1 TABLET(5 MG) BY MOUTH DAILY     Cardiovascular: Calcium  Channel Blockers 2 Failed - 07/05/2023  3:24 PM      Failed - Last BP in normal range    BP Readings from Last 1 Encounters:  06/26/23 (!) 163/88         Failed - Valid encounter within last 6 months    Recent Outpatient Visits           2 months ago Episodic lightheadedness   [place] [facility] [place], Kayleen Party, DO       Future Appointments             In 2 weeks Kathreen Pare, PA-C [facility] [place]            Passed - Last Heart Rate in normal range    Pulse Readings from Last 1 Encounters:  06/26/23 68

## 2023-07-07 NOTE — Telephone Encounter (Signed)
 Too soon for refill.  Requested Prescriptions  Pending Prescriptions Disp Refills   amLODipine  (NORVASC ) 5 MG tablet [Pharmacy Med Name: AMLODIPINE  BESYLATE 5MG  TABLETS] 90 tablet     Sig: TAKE 1 TABLET(5 MG) BY MOUTH DAILY     Cardiovascular: Calcium  Channel Blockers 2 Failed - 07/07/2023  3:19 PM      Failed - Last BP in normal range    BP Readings from Last 1 Encounters:  06/26/23 (!) 163/88         Failed - Valid encounter within last 6 months    Recent Outpatient Visits           2 months ago Episodic lightheadedness   [facility] [place], Kayleen Party, DO       Future Appointments             In 2 weeks Kathreen Pare, PA-C [facility] [place]            Passed - Last Heart Rate in normal range    Pulse Readings from Last 1 Encounters:  06/26/23 68

## 2023-07-12 ENCOUNTER — Ambulatory Visit

## 2023-07-14 ENCOUNTER — Emergency Department
Admission: EM | Admit: 2023-07-14 | Discharge: 2023-07-14 | Disposition: A | Discharge status: Home / Self Care | Attending: Emergency Medicine | Admitting: Emergency Medicine

## 2023-07-14 ENCOUNTER — Emergency Department

## 2023-07-14 ENCOUNTER — Other Ambulatory Visit: Payer: Self-pay

## 2023-07-14 DIAGNOSIS — I4891 Unspecified atrial fibrillation: Secondary | ICD-10-CM | POA: Diagnosis not present

## 2023-07-14 DIAGNOSIS — R9089 Other abnormal findings on diagnostic imaging of central nervous system: Secondary | ICD-10-CM | POA: Diagnosis not present

## 2023-07-14 DIAGNOSIS — G459 Transient cerebral ischemic attack, unspecified: Secondary | ICD-10-CM | POA: Diagnosis not present

## 2023-07-14 DIAGNOSIS — I6782 Cerebral ischemia: Secondary | ICD-10-CM | POA: Diagnosis not present

## 2023-07-14 DIAGNOSIS — I639 Cerebral infarction, unspecified: Secondary | ICD-10-CM | POA: Diagnosis not present

## 2023-07-14 DIAGNOSIS — R200 Anesthesia of skin: Secondary | ICD-10-CM | POA: Insufficient documentation

## 2023-07-14 DIAGNOSIS — Z79899 Other long term (current) drug therapy: Secondary | ICD-10-CM | POA: Insufficient documentation

## 2023-07-14 DIAGNOSIS — N183 Chronic kidney disease, stage 3 unspecified: Secondary | ICD-10-CM | POA: Insufficient documentation

## 2023-07-14 DIAGNOSIS — E7800 Pure hypercholesterolemia, unspecified: Secondary | ICD-10-CM

## 2023-07-14 DIAGNOSIS — I129 Hypertensive chronic kidney disease with stage 1 through stage 4 chronic kidney disease, or unspecified chronic kidney disease: Secondary | ICD-10-CM | POA: Diagnosis not present

## 2023-07-14 DIAGNOSIS — R202 Paresthesia of skin: Secondary | ICD-10-CM | POA: Diagnosis not present

## 2023-07-14 LAB — COMPREHENSIVE METABOLIC PANEL WITH GFR
ALT: 20 U/L (ref 0–44)
AST: 22 U/L (ref 15–41)
Albumin: 3.9 g/dL (ref 3.5–5.0)
Alkaline Phosphatase: 52 U/L (ref 38–126)
Anion gap: 10 (ref 5–15)
BUN: 27 mg/dL — ABNORMAL HIGH (ref 8–23)
CO2: 25 mmol/L (ref 22–32)
Calcium: 9.5 mg/dL (ref 8.9–10.3)
Chloride: 102 mmol/L (ref 98–111)
Creatinine, Ser: 0.98 mg/dL (ref 0.61–1.24)
GFR, Estimated: >60 mL/min (ref >60)
Glucose, Bld: 115 mg/dL — ABNORMAL HIGH (ref 70–99)
Potassium: 4.1 mmol/L (ref 3.5–5.1)
Sodium: 137 mmol/L (ref 135–145)
Total Bilirubin: 0.8 mg/dL (ref 0.0–1.2)
Total Protein: 7.2 g/dL (ref 6.5–8.1)

## 2023-07-14 LAB — PROTIME-INR
INR: 1.1 (ref 0.8–1.2)
Prothrombin Time: 14.1 s (ref 11.4–15.2)

## 2023-07-14 LAB — DIFFERENTIAL
Abs Immature Granulocytes: 0.07 10*3/uL (ref 0.00–0.07)
Basophils Absolute: 0.1 10*3/uL (ref 0.0–0.1)
Basophils Relative: 1 %
Eosinophils Absolute: 0.2 10*3/uL (ref 0.0–0.5)
Eosinophils Relative: 2 %
Immature Granulocytes: 1 %
Lymphocytes Relative: 11 %
Lymphs Abs: 1.1 10*3/uL (ref 0.7–4.0)
Monocytes Absolute: 0.8 10*3/uL (ref 0.1–1.0)
Monocytes Relative: 8 %
Neutro Abs: 7.7 10*3/uL (ref 1.7–7.7)
Neutrophils Relative %: 77 %

## 2023-07-14 LAB — CBC
HCT: 46.3 % (ref 39.0–52.0)
Hemoglobin: 15.1 g/dL (ref 13.0–17.0)
MCH: 28.7 pg (ref 26.0–34.0)
MCHC: 32.6 g/dL (ref 30.0–36.0)
MCV: 88 fL (ref 80.0–100.0)
Platelets: 112 10*3/uL — ABNORMAL LOW (ref 150–400)
RBC: 5.26 MIL/uL (ref 4.22–5.81)
RDW: 13.3 % (ref 11.5–15.5)
WBC: 9.9 10*3/uL (ref 4.0–10.5)
nRBC: 0 % (ref 0.0–0.2)

## 2023-07-14 LAB — APTT: aPTT: 34 s (ref 24–36)

## 2023-07-14 LAB — ETHANOL: Alcohol, Ethyl (B): <15 mg/dL (ref <15)

## 2023-07-14 LAB — CBG MONITORING, ED: Glucose-Capillary: 101 mg/dL — ABNORMAL HIGH (ref 70–99)

## 2023-07-14 MED ORDER — DILTIAZEM HCL ER COATED BEADS 120 MG PO CP24
120.0000 mg | ORAL_CAPSULE | Freq: Once | ORAL | Status: AC
  Administered 2023-07-14: 120 mg via ORAL
  Filled 2023-07-14: qty 1

## 2023-07-14 MED ORDER — SODIUM CHLORIDE 0.9 % IV BOLUS
1000.0000 mL | Freq: Once | INTRAVENOUS | Status: AC
Start: 2023-07-14 — End: 2023-07-14
  Administered 2023-07-14: 1000 mL via INTRAVENOUS

## 2023-07-14 MED ORDER — ROSUVASTATIN CALCIUM 5 MG PO TABS: 20.0000 mg | ORAL_TABLET | Freq: Every day | ORAL | 3 refills | Status: DC

## 2023-07-14 MED ORDER — ASPIRIN 81 MG PO CHEW: 81.0000 mg | CHEWABLE_TABLET | Freq: Every day | ORAL | 2 refills | Status: AC

## 2023-07-14 NOTE — Discharge Instructions (Addendum)
 I have decided to increase the dose of his Crestor  instead of change it to a different statin for his cholesterol.  This increased dose has been prescribed to his pharmacy.  I have also sent aspirin 81 mg for you to take once daily.  Please follow-up with your primary care provider early next week for reassessment and any ongoing outpatient management.  They may also need to discuss with you starting a medication to keep your heart rate lower with your A-fib.   Please return immediately if you notice any severe or worsening symptoms including return of numbness, one-sided weakness, facial droop, speech changes, or other concerning features.

## 2023-07-14 NOTE — ED Triage Notes (Addendum)
 Pt to ED via POV from home. Pt reports woke up at 0300 and felt swimmy headed and laid in bed and reports got up at 0730 and started having right sided facial and right hand. Pt reports symptoms have resolved. No hx of CVA. No blood thinners. No falls. 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

## 2023-07-14 NOTE — ED Provider Notes (Signed)
 [facility] Provider Note    Event Date/Time   First MD Initiated Contact with Patient 07/14/23 1029     (approximate)   History   Numbness   HPI Scott Ashley is a 89 y.o. male with history of HTN, CKD stage III, A-fib presenting today for numbness.  Patient states shortly after awakening this morning he had a brief spell of numbness to his right cheek and right hand.  It lasted less than 60 seconds.  He denied any associated extremity weakness, speech changes, or vision changes.  Upon arrival to the ED he denies any ongoing symptoms.  No episodes similar to this in the past.  Otherwise denies any recent illness, cough, congestion, chest pain, shortness of breath, nausea, vomiting, abdominal pain.  He is not on any blood thinners for his atrial fibrillation.     Physical Exam   Triage Vital Signs: ED Triage Vitals  Encounter Vitals Group     BP 07/14/23 1013 125/75     Systolic BP Percentile --      Diastolic BP Percentile --      Pulse Rate 07/14/23 1013 96     Resp 07/14/23 1013 20     Temp 07/14/23 1013 97.9 F (36.6 C)     Temp Source 07/14/23 1013 Oral     SpO2 07/14/23 1013 100 %     Weight --      Height --      Head Circumference --      Peak Flow --      Pain Score 07/14/23 1014 0     Pain Loc --      Pain Education --      Exclude from Growth Chart --     Most recent vital signs: Vitals:   07/14/23 1013 07/14/23 1110  BP: 125/75 (!) 144/82  Pulse: 96 (!) 119  Resp: 20 18  Temp: 97.9 F (36.6 C)   SpO2: 100% 98%   Physical Exam: I have reviewed the vital signs and nursing notes. General: Awake, alert, no acute distress.  Nontoxic appearing. Head:  Atraumatic, normocephalic.   ENT:  EOM intact, PERRL. Oral mucosa is pink and moist with no lesions. Neck: Neck is supple with full range of motion, No meningeal signs. Cardiovascular:  irregular rhythm with tachycardia, No murmurs. Peripheral pulses palpable and equal  bilaterally. Respiratory:  Symmetrical chest wall expansion.  No rhonchi, rales, or wheezes.  Good air movement throughout.  No use of accessory muscles.   Musculoskeletal:  No cyanosis or edema. Moving extremities with full ROM Abdomen:  Soft, nontender, nondistended. Neuro:  GCS 15, moving all four extremities, interacting appropriately. Speech clear.  Cranial nerves II through XII intact.  Strength 5 out of 5 throughout bilateral upper and lower extremities.  Sensation equal and intact throughout bilateral upper and lower extremities with no numbness.  No dysmetria. Psych:  Calm, appropriate.   Skin:  Warm, dry, no rash.    ED Results / Procedures / Treatments   Labs (all labs ordered are listed, but only abnormal results are displayed) Labs Reviewed  CBC - Abnormal; Notable for the following components:      Result Value   Platelets 112 (*)    All other components within normal limits  COMPREHENSIVE METABOLIC PANEL WITH GFR - Abnormal; Notable for the following components:   Glucose, Bld 115 (*)    BUN 27 (*)    All other components within normal limits  CBG MONITORING, ED - Abnormal; Notable for the following components:   Glucose-Capillary 101 (*)    All other components within normal limits  PROTIME-INR  APTT  DIFFERENTIAL  ETHANOL     EKG My EKG interpretation: Rate of 125, A-fib with RVR, right axis deviation.  No acute ST elevations or depressions   RADIOLOGY Independently interpreted CT head with no acute pathology   PROCEDURES:  Critical Care performed: No  Procedures   MEDICATIONS ORDERED IN ED: Medications  sodium chloride  0.9 % bolus 1,000 mL (1,000 mLs Intravenous New Bag/Given 07/14/23 1057)  diltiazem (CARDIZEM CD) 24 hr capsule 120 mg (120 mg Oral Given 07/14/23 1148)     IMPRESSION / MDM / ASSESSMENT AND PLAN / ED COURSE  I reviewed the triage vital signs and the nursing notes.                              Differential diagnosis includes, but  is not limited to, TIA, CVA, hemorrhagic CVA, A-fib with RVR, electrolyte abnormality, dehydration  Patient's presentation is most consistent with acute complicated illness / injury requiring diagnostic workup.  Patient is a 89 year old male presenting today for episode of numbness to the right side of his face and right hand.  Symptoms resolved prior to arrival to the ED and no other acute neurological symptoms.  Neuroexam completely negative on arrival but does have a EKG showing A-fib with RVR.  Not on any blood thinners.  Laboratory workup with unremarkable CBC, CMP.  CT head with no acute findings.  Patient given 1 L of fluids initially for his A-fib with RVR.  Also gave patient 120 mg 24-hour Cardizem.  No ongoing A-fib with RVR consistently.  Follow-up MRI was ordered of the brain to rule out CVA.  This was negative for any acute pathology.  Discussed with patient and family at bedside regarding potential ongoing TIA workup.  We discussed admission for additional testing such as echo and medication management.  They ultimately felt safe with him going home at this time and patient prefers that given his age.  Will start him on once daily aspirin as well as increase his Crestor  to 20 mg daily to help with stroke prevention.  They will follow-up with your PCP early next week for reevaluation from a stroke standpoint as well as monitoring of his A-fib to see if he needs continued rate control medications at home.  Given strict return precautions for any acute worsening symptoms.  The patient is on the cardiac monitor to evaluate for evidence of arrhythmia and/or significant heart rate changes. Clinical Course as of 07/14/23 1414  Fri Jul 14, 2023  1117 CT head is negative.  Patient's A-fib much better rate controlled getting fluids but will give one-time dose of Cardizem orally to maintain.  Discussed with patient and granddaughter at bedside regarding further TIA workup.  They would prefer to have MRI  and then discharge if negative.  They do not want admission at this time and would prefer medication adjustments and going home and following up with PCP. [DW]  1140 ABCD 2 score of 2. Low risk category [DW]    Clinical Course User Index [DW] Kandee Orion, MD     FINAL CLINICAL IMPRESSION(S) / ED DIAGNOSES   Final diagnoses:  Numbness  Atrial fibrillation, unspecified type [facility])     Rx / DC Orders   ED Discharge Orders  Ordered    aspirin (ASPIRIN CHILDRENS) 81 MG chewable tablet  Daily        07/14/23 1412    rosuvastatin  (CRESTOR ) 5 MG tablet  Daily        07/14/23 1412             Note:  This document was prepared using Dragon voice recognition software and may include unintentional dictation errors.   Kandee Orion, MD 07/14/23 [id number]

## 2023-07-18 ENCOUNTER — Ambulatory Visit

## 2023-07-24 ENCOUNTER — Ambulatory Visit: Admitting: Physician Assistant

## 2023-07-24 DIAGNOSIS — Z466 Encounter for fitting and adjustment of urinary device: Secondary | ICD-10-CM

## 2023-07-24 NOTE — Progress Notes (Signed)
 Cath Change/ Replacement  Patient is present today for a catheter change due to urinary retention.  8ml of water was removed from the balloon, a 16FR Silastic foley cath was removed without difficulty.  Patient was cleaned and prepped in a sterile fashion with betadine and 2% lidocaine  jelly was instilled into the urethra. A 16 FR Silastic foley cath was replaced into the bladder, no complications were noted. Urine return was noted 30ml and urine was pink in color. The balloon was filled with 10ml of sterile water. A leg bag was attached for drainage.  Patient tolerated well.    Performed by: Odena Mcquaid, PA-C   Follow up: Return in about 4 weeks (around 08/21/2023) for Catheter exchange.

## 2023-07-25 ENCOUNTER — Ambulatory Visit (INDEPENDENT_AMBULATORY_CARE_PROVIDER_SITE_OTHER): Admitting: Family Medicine

## 2023-07-25 ENCOUNTER — Encounter: Payer: Self-pay | Admitting: Family Medicine

## 2023-07-25 VITALS — BP 132/70 | HR 136 | Ht 72.0 in | Wt 172.1 lb

## 2023-07-25 DIAGNOSIS — Z8673 Personal history of transient ischemic attack (TIA), and cerebral infarction without residual deficits: Secondary | ICD-10-CM | POA: Diagnosis not present

## 2023-07-25 DIAGNOSIS — I10 Essential (primary) hypertension: Secondary | ICD-10-CM | POA: Diagnosis not present

## 2023-07-25 DIAGNOSIS — I4891 Unspecified atrial fibrillation: Secondary | ICD-10-CM

## 2023-07-25 DIAGNOSIS — E7800 Pure hypercholesterolemia, unspecified: Secondary | ICD-10-CM | POA: Diagnosis not present

## 2023-07-25 MED ORDER — ROSUVASTATIN CALCIUM 10 MG PO TABS: 10.0000 mg | ORAL_TABLET | ORAL | 3 refills | Status: DC

## 2023-07-25 MED ORDER — METOPROLOL SUCCINATE ER 50 MG PO TB24: 50.0000 mg | ORAL_TABLET | Freq: Every day | ORAL | 2 refills | Status: DC

## 2023-07-25 NOTE — Patient Instructions (Addendum)
 Thank you for coming to the office today.  Atrial Fibrillation with fast heart rate likely can explain the not feeling well and can explain the dizzy  PAUSE or HOLD the Amlodipine  5mg  daily for now. This was for BP but we may not need it on the other medicine.  START Metoprolol XL 50mg  once daily, 24 hour BP and Rate Control.  Keep an eye on heart rate, goal < 100 consistently and feeling better.  IF BP is still elevated >140, BUT the heart rate is controlled, we can add back Amlodipine  5mg  daily.  IF BP is still elevated >140 BUT heart rate is NOT controlled, we can increase Metoprolol.  Keep on the Aspirin  81mg  daily to help prevent future problems  Dose INCREASE Rosuvastatin  from 5mg  twice a week, now doubled up to 10mg  twice a week.  If not successful with the heart rate or atrial fibrillation symptoms, we can refer to Cardiology  If new or concerning stroke symptoms please seek care promptly at hospital ED.  DUE for FASTING BLOOD WORK (no food or drink after midnight before the lab appointment, only water or coffee without cream/sugar on the morning of)  SCHEDULE Lab Only visit in the morning at the clinic for lab draw in 4-6 weeks  - Make sure Lab Only appointment is at about 1 week before your next appointment, so that results will be available  For Lab Results, once available within 2-3 days of blood draw, you can can log in to MyChart online to view your results and a brief explanation. Also, we can discuss results at next follow-up visit.    Please schedule a Follow-up Appointment to: Return in about 4 weeks (around 08/22/2023) for 4-6 weeks fasting lab then 1 week later Annual Physical.  If you have any other questions or concerns, please feel free to call the office or send a message through MyChart. You may also schedule an earlier appointment if necessary.  Additionally, you may be receiving a survey about your experience at our office within a few days to 1 week by  e-mail or mail. We value your feedback.  Domingo Friend, DO [facility], [place]

## 2023-07-25 NOTE — Progress Notes (Signed)
 Subjective:    Patient ID: Scott Ashley, male    DOB: [birth year], 89 y.o.   MRN: [id number]  Scott Ashley is a 89 y.o. male presenting on 07/25/2023 for Atrial Fibrillation   HPI  Discussed the use of AI scribe software for clinical note transcription with the patient, who gave verbal consent to proceed.  History of Present Illness   Scott Ashley is a 89 year old male with atrial fibrillation who presents for an ER follow-up after experiencing numbness. He is accompanied by Germain Kohler, granddaughter primary caregiver, who assists with communication and history   ED FOLLOW-UP VISIT  Hospital/Location: [facility] Date of ED Visit: 07/14/23  Reason for Presenting to ED: Atrial Fibrillation RVR  FOLLOW-UP  - ED provider note and record have been reviewed - Patient presents today about 11 days after recent ED visit  Approximately ten days ago, he experienced numbness on the R cheek and head, specifically around the area by the nose, lasting about three to four seconds. He also noted his hand felt like it was 'going to sleep'. No other symptoms such as weakness, speech changes, vision loss, or syncope were present. He was evaluated in the ER where blood tests, a CT scan, and an MRI were performed. The MRI showed chronic microvascular changes. No acute stroke identified.  He has a history of atrial fibrillation. During the ER visit, his heart rate was described as irregular. He recalls being given fluids in the ER and that aspirin  was recommended. His heart rate was 133 bpm when he checked his blood pressure at home. He has been experiencing fatigue, which may be related to the atrial fibrillation or medication side effects.  He was previously on Crestor  5 mg twice a week but increased to 20 mg daily. He has been taking 15 mg daily since discharge. In 2021, he experienced side effects such as stomach, back, and joint pain when taking Crestor  daily, which led to reducing the dose to twice a  week. He feels generally unwell since resuming daily use post-hospitalization and is unsure if this is related to the medication.  He is currently taking amlodipine  for blood pressure management. Recent home blood pressure readings, including a value of 140, were reported by him and his family. He was also given a one-time dose of Cardizem  in the ER for rate control. He has not been on metoprolol before, but it was discussed as a potential medication for rate control.  He reports occasional ringing in his ear and a sensation from his ear to his forehead, which he describes as feeling 'different'. This sensation is intermittent and was noted after the episode of numbness. No persistent weakness or loss of motor function in his limbs.   - New medications on discharge: Aspirin  81 - Changes to current meds on discharge: Rosuvastatin  5mg  x 4 = 20mg    I have reviewed the discharge medication list, and have reconciled the current and discharge medications today.   Current Outpatient Medications:    amLODipine  (NORVASC ) 5 MG tablet, TAKE 1 TABLET(5 MG) BY MOUTH DAILY, Disp: 30 tablet, Rfl: 1   aspirin  (ASPIRIN  CHILDRENS) 81 MG chewable tablet, Chew 1 tablet (81 mg total) by mouth daily., Disp: 30 tablet, Rfl: 2   BELSOMRA  20 MG TABS, TAKE 1/2 TO 1 TABLET(10 TO 20 MG) BY MOUTH AT BEDTIME AS NEEDED, Disp: 30 tablet, Rfl: 2   finasteride  (PROSCAR ) 5 MG tablet, TAKE 1 TABLET(5 MG) BY MOUTH DAILY, Disp: 30  tablet, Rfl: 11   latanoprost  (XALATAN ) 0.005 % ophthalmic solution, Place 1 drop into both eyes at bedtime., Disp: , Rfl: 4   metoprolol succinate (TOPROL-XL) 50 MG 24 hr tablet, Take 1 tablet (50 mg total) by mouth daily. Take with or immediately following a meal., Disp: 30 tablet, Rfl: 2   Multiple Vitamin (MULTIVITAMIN WITH MINERALS) TABS tablet, Take 1 tablet by mouth daily., Disp: , Rfl:    omeprazole  (PRILOSEC) 20 MG capsule, TAKE 1 CAPSULE BY MOUTH DAILY, Disp: 100 capsule, Rfl: 3   [START ON  07/27/2023] rosuvastatin  (CRESTOR ) 10 MG tablet, Take 1 tablet (10 mg total) by mouth 2 (two) times a week., Disp: 45 tablet, Rfl: 3  ------------------------------------------------------------------------- Social History   Tobacco Use   Smoking status: Never    Passive exposure: Never   Smokeless tobacco: Never  Vaping Use   Vaping status: Never Used  Substance Use Topics   Alcohol use: Never   Drug use: Never    Review of Systems Per HPI unless specifically indicated above     Objective:    BP 132/70 (BP Location: Left Arm, Patient Position: Sitting, Cuff Size: Normal)   Pulse (!) 136   Ht 6' (1.829 m)   Wt 172 lb 2 oz (78.1 kg)   SpO2 96%   BMI 23.34 kg/m   Wt Readings from Last 3 Encounters:  07/25/23 172 lb 2 oz (78.1 kg)  06/26/23 169 lb (76.7 kg)  05/25/23 169 lb (76.7 kg)    Physical Exam Vitals and nursing note reviewed.  Constitutional:      General: He is not in acute distress.    Appearance: He is well-developed. He is not diaphoretic.     Comments: Well-appearing, comfortable, cooperative  HENT:     Head: Normocephalic and atraumatic.   Eyes:     General:        Right eye: No discharge.        Left eye: No discharge.     Conjunctiva/sclera: Conjunctivae normal.   Neck:     Thyroid : No thyromegaly.   Cardiovascular:     Rate and Rhythm: Tachycardia present. Rhythm irregular.     Pulses: Normal pulses.     Heart sounds: Normal heart sounds. No murmur heard. Pulmonary:     Effort: Pulmonary effort is normal. No respiratory distress.     Breath sounds: Normal breath sounds. No wheezing or rales.   Musculoskeletal:        General: Normal range of motion.     Cervical back: Normal range of motion and neck supple.  Lymphadenopathy:     Cervical: No cervical adenopathy.   Skin:    General: Skin is warm and dry.     Findings: No erythema or rash.   Neurological:     Mental Status: He is alert and oriented to person, place, and time. Mental  status is at baseline.   Psychiatric:        Behavior: Behavior normal.     Comments: Well groomed, good eye contact, normal speech and thoughts     I have personally reviewed the radiology report from 07/14/23 on CT.  Study Result EXAM: CT HEAD WITHOUT 07/14/2023 10:29:10 AM  TECHNIQUE: CT of the head was performed without the administration of intravenous contrast. Automated exposure control, iterative reconstruction, and/or weight based adjustment of the mA/kV was utilized to reduce the radiation dose to as low as reasonably achievable.  COMPARISON: 04/23/2011  CLINICAL HISTORY: TIA. Pt to ED  via POV from home. Pt reports woke up at 0300 and felt swimmy headed and laid in bed and reports got up at 0730 and started having right sided facial and right hand. Pt reports symptoms have resolved. No hx of CVA. No blood thinners. No falls. 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  FINDINGS:  BRAIN AND VENTRICLES: No acute intracranial hemorrhage. No mass effect or midline shift. No extra-axial fluid collection. Gray-white differentiation is maintained. No hydrocephalus. Stable mild chronic small vessel disease and generalized volume loss within the expected range for patient age.  ORBITS: No acute abnormality.  SINUSES AND MASTOIDS: No acute abnormality.  SOFT TISSUES AND SKULL: No acute skull fracture. No acute soft tissue abnormality.  IMPRESSION: 1. No acute intracranial abnormality. 2. Stable mild chronic small vessel disease and generalized volume loss within the expected range for patient age.  Electronically signed by: Audra Blend MD 07/14/2023 10:47 AM EDT RP Workstation: [id number]  ---------------------------------------------  Study Result CLINICAL DATA: Right-sided facial numbness, hand numbness which has resolved.  EXAM: MRI HEAD WITHOUT CONTRAST  TECHNIQUE: Multiplanar, multiecho pulse sequences of the brain and surrounding structures were  obtained without intravenous contrast.  COMPARISON: Earlier same day CT head.  FINDINGS: Brain: No acute infarct. T2/FLAIR hyperintensity in the periventricular and subcortical white matter. Mildly prominent perivascular spaces in the basal ganglia. Chronic microhemorrhages in the left thalamus. 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 No edema, mass effect, or midline shift. Posterior fossa is unremarkable. Normal appearance of midline structures. The basilar cisterns are patent. No extra-axial fluid collections.  Ventricles: Prominence of the lateral ventricles suggestive of underlying parenchymal volume loss.  Vascular: Skull base flow voids are visualized.  Skull and upper cervical spine: No focal abnormality.  Sinuses/Orbits: Orbits are symmetric. Mild mucosal thickening in the ethmoid sinuses. Mucosal thickening in the maxillary sinuses most pronounced in the alveolar recesses.  Other: Mastoid air cells are clear.  IMPRESSION: No acute intracranial abnormality.  Mild chronic microvascular ischemic changes. Generalized parenchymal volume loss.  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  Chronic microhemorrhages in the left thalamus.   Electronically Signed By: Denny Flack M.D. On: 07/14/2023 13:13   Results for orders placed or performed during the hospital encounter of 07/14/23  Protime-INR   Collection Time: 07/14/23 10:17 AM  Result Value Ref Range   Prothrombin Time 14.1 11.4 - 15.2 seconds   INR 1.1 0.8 - 1.2  APTT   Collection Time: 07/14/23 10:17 AM  Result Value Ref Range   aPTT 34 24 - 36 seconds  CBC   Collection Time: 07/14/23 10:17 AM  Result Value Ref Range   WBC 9.9 4.0 - 10.5 K/uL   RBC 5.26 4.22 - 5.81 MIL/uL   Hemoglobin 15.1 13.0 - 17.0 g/dL   HCT 09.8 11.9 - 14.7 %   MCV 88.0 80.0 - 100.0 fL   MCH 28.7 26.0 - 34.0 pg   MCHC 32.6 30.0 - 36.0 g/dL   RDW 82.9 56.2 - 13.0 %   Platelets 112 (L) 150 - 400 K/uL   nRBC 0.0 0.0  - 0.2 %  Differential   Collection Time: 07/14/23 10:17 AM  Result Value Ref Range   Neutrophils Relative % 77 %   Neutro Abs 7.7 1.7 - 7.7 K/uL   Lymphocytes Relative 11 %   Lymphs Abs 1.1 0.7 - 4.0 K/uL   Monocytes Relative 8 %   Monocytes Absolute 0.8 0.1 - 1.0 K/uL   Eosinophils Relative 2 %  Eosinophils Absolute 0.2 0.0 - 0.5 K/uL   Basophils Relative 1 %   Basophils Absolute 0.1 0.0 - 0.1 K/uL   Immature Granulocytes 1 %   Abs Immature Granulocytes 0.07 0.00 - 0.07 K/uL  Comprehensive metabolic panel   Collection Time: 07/14/23 10:17 AM  Result Value Ref Range   Sodium 137 135 - 145 mmol/L   Potassium 4.1 3.5 - 5.1 mmol/L   Chloride 102 98 - 111 mmol/L   CO2 25 22 - 32 mmol/L   Glucose, Bld 115 (H) 70 - 99 mg/dL   BUN 27 (H) 8 - 23 mg/dL   Creatinine, Ser 8.29 0.61 - 1.24 mg/dL   Calcium  9.5 8.9 - 10.3 mg/dL   Total Protein 7.2 6.5 - 8.1 g/dL   Albumin 3.9 3.5 - 5.0 g/dL   AST 22 15 - 41 U/L   ALT 20 0 - 44 U/L   Alkaline Phosphatase 52 38 - 126 U/L   Total Bilirubin 0.8 0.0 - 1.2 mg/dL   GFR, Estimated >56 >21 mL/min   Anion gap 10 5 - 15  Ethanol   Collection Time: 07/14/23 10:17 AM  Result Value Ref Range   Alcohol, Ethyl (B) <15 <15 mg/dL  CBG monitoring, ED   Collection Time: 07/14/23 10:19 AM  Result Value Ref Range   Glucose-Capillary 101 (H) 70 - 99 mg/dL      Assessment & Plan:   Problem List Items Addressed This Visit     Pure hypercholesterolemia   Relevant Medications   metoprolol succinate (TOPROL-XL) 50 MG 24 hr tablet   rosuvastatin  (CRESTOR ) 10 MG tablet (Start on 07/27/2023)   Other Visit Diagnoses       Atrial fibrillation with RVR (HCC)    -  Primary   Relevant Medications   metoprolol succinate (TOPROL-XL) 50 MG 24 hr tablet   rosuvastatin  (CRESTOR ) 10 MG tablet (Start on 07/27/2023)     Essential hypertension       Relevant Medications   metoprolol succinate (TOPROL-XL) 50 MG 24 hr tablet   rosuvastatin  (CRESTOR ) 10 MG tablet  (Start on 07/27/2023)     History of transient ischemic attack (TIA)           Transient Ischemic Attack (TIA) Likely TIA due to transient symptoms. Imaging showed chronic microvascular changes, no acute stroke. Explained TIA vs. stroke and future risk. - Continue aspirin  81 mg daily for stroke prevention. - Monitor for new or worsening neurological symptoms and seek immediate medical attention if he occurs.  Atrial Fibrillation w RVR Atrial fibrillation with recent elevated heart rate, contributing to fatigue. Discussed rate control and anticoagulation risks. Initiated metoprolol for rate control, paused amlodipine  to assess effects. On ASA 81, offer anticoagulation. Patient is high risk for bleeding complication will reconsider with Cardiology in future if interested - Start metoprolol XL 50 mg once daily for rate control. - Pause amlodipine  5 mg daily temporarily to assess the effect of metoprolol on blood pressure and heart rate. - Consider referral to cardiology if symptoms persist or worsen. - Monitor heart rate with a goal of less than 100 bpm consistently.  Hyperlipidemia Past side effects with higher rosuvastatin  doses in past with 5mg  daily back in 2021. On 5mg  twice weekly but now with recent concern ER recommended 20 mg daily, but opted for conservative 10 mg twice a week to minimize side effects. - Start rosuvastatin  10 mg twice a week to minimize side effects while managing cholesterol levels. - Monitor for side  effects such as myalgia or weakness.        Meds ordered this encounter  Medications   metoprolol succinate (TOPROL-XL) 50 MG 24 hr tablet    Sig: Take 1 tablet (50 mg total) by mouth daily. Take with or immediately following a meal.    Dispense:  30 tablet    Refill:  2   rosuvastatin  (CRESTOR ) 10 MG tablet    Sig: Take 1 tablet (10 mg total) by mouth 2 (two) times a week.    Dispense:  45 tablet    Refill:  3    Dose increase from 5 to 10mg     Follow up  plan: Return in about 4 weeks (around 08/22/2023) for 4-6 weeks fasting lab then 1 week later Annual Physical.   Domingo Friend, DO [facility] 07/25/2023, 2:26 PM

## 2023-08-04 ENCOUNTER — Ambulatory Visit: Admitting: Physical Therapy

## 2023-08-07 ENCOUNTER — Other Ambulatory Visit: Payer: Self-pay | Admitting: Otolaryngology

## 2023-08-07 DIAGNOSIS — R42 Dizziness and giddiness: Secondary | ICD-10-CM | POA: Diagnosis not present

## 2023-08-07 DIAGNOSIS — R519 Headache, unspecified: Secondary | ICD-10-CM | POA: Diagnosis not present

## 2023-08-14 ENCOUNTER — Ambulatory Visit
Admission: RE | Admit: 2023-08-14 | Discharge: 2023-08-14 | Disposition: A | Discharge status: Home / Self Care | Source: Ambulatory Visit | Attending: Otolaryngology | Admitting: Otolaryngology

## 2023-08-14 DIAGNOSIS — R42 Dizziness and giddiness: Secondary | ICD-10-CM | POA: Diagnosis not present

## 2023-08-14 DIAGNOSIS — I6523 Occlusion and stenosis of bilateral carotid arteries: Secondary | ICD-10-CM | POA: Diagnosis not present

## 2023-08-16 ENCOUNTER — Ambulatory Visit: Admitting: Physician Assistant

## 2023-08-16 ENCOUNTER — Encounter: Payer: Self-pay | Admitting: Physician Assistant

## 2023-08-16 ENCOUNTER — Encounter (INDEPENDENT_AMBULATORY_CARE_PROVIDER_SITE_OTHER): Payer: Self-pay | Admitting: Family Medicine

## 2023-08-16 VITALS — BP 133/81 | HR 73 | Ht 72.0 in | Wt 172.0 lb

## 2023-08-16 DIAGNOSIS — Z8673 Personal history of transient ischemic attack (TIA), and cerebral infarction without residual deficits: Secondary | ICD-10-CM

## 2023-08-16 DIAGNOSIS — Z466 Encounter for fitting and adjustment of urinary device: Secondary | ICD-10-CM

## 2023-08-16 DIAGNOSIS — R519 Headache, unspecified: Secondary | ICD-10-CM

## 2023-08-16 DIAGNOSIS — G43909 Migraine, unspecified, not intractable, without status migrainosus: Secondary | ICD-10-CM | POA: Diagnosis not present

## 2023-08-16 MED ORDER — RIZATRIPTAN BENZOATE 10 MG PO TBDP: 10.0000 mg | ORAL_TABLET | ORAL | 0 refills | Status: DC | PRN

## 2023-08-16 NOTE — Progress Notes (Signed)
 Cath Change/ Replacement  Patient is present today for a catheter change due to urinary retention.  8ml of water was removed from the balloon, a 16FR Silastic foley cath was removed without difficulty.  Patient was cleaned and prepped in a sterile fashion with betadine and 2% lidocaine  jelly was instilled into the urethra. A 16 FR Silastic foley cath was replaced into the bladder, no complications were noted. Urine return was noted 5ml and urine was yellow in color. The balloon was filled with 10ml of sterile water. A leg bag was attached for drainage.  Patient tolerated well.    Performed by: Harriett Azar, PA-C   Follow up: Return in about 4 weeks (around 09/13/2023) for Catheter exchange.

## 2023-08-16 NOTE — Telephone Encounter (Signed)
Please see the MyChart message reply(ies) for my assessment and plan.    This patient gave consent for this Medical Advice Message and is aware that it may result in a bill to their insurance company, as well as the possibility of receiving a bill for a co-payment or deductible. They are an established patient, but are not seeking medical advice exclusively about a problem treated during an in person or video visit in the last seven days. I did not recommend an in person or video visit within seven days of my reply.    I spent a total of 10 minutes cumulative time within 7 days through MyChart messaging.  Hosea Hanawalt, DO   

## 2023-08-16 NOTE — Addendum Note (Signed)
 Addended by: EDMAN MARSA PARAS on: 08/16/2023 05:17 PM   Modules accepted: Orders

## 2023-08-17 NOTE — Addendum Note (Signed)
 Addended by: EDMAN MARSA PARAS on: 08/17/2023 02:31 PM   Modules accepted: Orders

## 2023-08-21 ENCOUNTER — Other Ambulatory Visit: Payer: Self-pay | Admitting: Family Medicine

## 2023-08-21 DIAGNOSIS — G43909 Migraine, unspecified, not intractable, without status migrainosus: Secondary | ICD-10-CM

## 2023-08-22 NOTE — Telephone Encounter (Signed)
 Requested medications are due for refill today.  unsure  Requested medications are on the active medications list.  yes  Last refill. 08/16/2023 #10 0 rf  Future visit scheduled.   yes  Notes to clinic.  New medication to this pt.    Requested Prescriptions  Pending Prescriptions Disp Refills   rizatriptan  (MAXALT -MLT) 10 MG disintegrating tablet [Pharmacy Med Name: RIZATRIPTAN  ODT 10MG  TABLETS] 10 tablet 0    Sig: DISSOLVE ONE TABLET BY MOUTH AS NEEDED, MAY REPEAT IN 2 HOURS IF NEEDED     Neurology:  Migraine Therapy - Triptan Passed - 08/22/2023  3:55 PM      Passed - Last BP in normal range    BP Readings from Last 1 Encounters:  08/16/23 133/81         Passed - Valid encounter within last 12 months    Recent Outpatient Visits           4 weeks ago Atrial fibrillation with [facility])   [place] [facility] [place], Marsa PARAS, DO   4 months ago Episodic lightheadedness   [place] [facility] [place], Marsa PARAS, DO       Future Appointments             In 3 weeks Maurine Lukes, PA-C [facility] [place]

## 2023-08-24 ENCOUNTER — Ambulatory Visit: Admitting: Physician Assistant

## 2023-08-24 ENCOUNTER — Encounter: Payer: Self-pay | Admitting: Family Medicine

## 2023-08-24 ENCOUNTER — Other Ambulatory Visit

## 2023-08-24 ENCOUNTER — Ambulatory Visit: Payer: Medicare Other

## 2023-08-24 VITALS — Ht 72.0 in | Wt 172.0 lb

## 2023-08-24 DIAGNOSIS — Z0000 Encounter for general adult medical examination without abnormal findings: Secondary | ICD-10-CM | POA: Diagnosis not present

## 2023-08-24 NOTE — Progress Notes (Signed)
 Subjective:   Scott Ashley is a 89 y.o. who presents for a Medicare Wellness preventive visit.  As a reminder, Annual Wellness Visits don't include a physical exam, and some assessments may be limited, especially if this visit is performed virtually. We may recommend an in-person follow-up visit with your provider if needed.  Visit Complete: Virtual I connected with  Scott Ashley on 08/24/23 by a audio enabled telemedicine application and verified that I am speaking with the correct person using two identifiers.  Patient Location: Home  Provider Location: Home Office  I discussed the limitations of evaluation and management by telemedicine. The patient expressed understanding and agreed to proceed.  Vital Signs: Because this visit was a virtual/telehealth visit, some criteria may be missing or patient reported. Any vitals not documented were not able to be obtained and vitals that have been documented are patient reported.    Persons Participating in Visit: Patient.  AWV Questionnaire: No: Patient Medicare AWV questionnaire was not completed prior to this visit.  Cardiac Risk Factors include: advanced age (>29men, >58 women);male gender;hypertension     Objective:    Today's Vitals   08/24/23 1318  Weight: 172 lb (78 kg)  Height: 6' (1.829 m)   Body mass index is 23.33 kg/m.     08/24/2023    1:25 PM 07/14/2023   10:15 AM 12/12/2022   10:31 PM 11/26/2022   11:54 PM 08/18/2022    1:33 PM 01/17/2022   10:14 PM 01/17/2022    9:55 AM  Advanced Directives  Does Patient Have a Medical Advance Directive? Yes No No No Yes Yes Yes  Type of Estate agent of [place];Living will    Healthcare Power of [place];Living will Healthcare Power of [place];Living will Living will;Healthcare Power of Attorney  Does patient want to make changes to medical advance directive? No - Patient declined    No - Patient declined No - Patient declined   Copy of Healthcare  Power of Attorney in Chart? Yes - validated most recent copy scanned in chart (See row information)    Yes - validated most recent copy scanned in chart (See row information) Yes - validated most recent copy scanned in chart (See row information)   Would patient like information on creating a medical advance directive?  No - Patient declined  No - Patient declined       Current Medications (verified) Outpatient Encounter Medications as of 08/24/2023  Medication Sig   amLODipine  (NORVASC ) 5 MG tablet TAKE 1 TABLET(5 MG) BY MOUTH DAILY   aspirin  (ASPIRIN  CHILDRENS) 81 MG chewable tablet Chew 1 tablet (81 mg total) by mouth daily.   BELSOMRA  20 MG TABS TAKE 1/2 TO 1 TABLET(10 TO 20 MG) BY MOUTH AT BEDTIME AS NEEDED   finasteride  (PROSCAR ) 5 MG tablet TAKE 1 TABLET(5 MG) BY MOUTH DAILY   latanoprost  (XALATAN ) 0.005 % ophthalmic solution Place 1 drop into both eyes at bedtime.   metoprolol  succinate (TOPROL -XL) 50 MG 24 hr tablet Take 1 tablet (50 mg total) by mouth daily. Take with or immediately following a meal.   Multiple Vitamin (MULTIVITAMIN WITH MINERALS) TABS tablet Take 1 tablet by mouth daily.   omeprazole  (PRILOSEC) 20 MG capsule TAKE 1 CAPSULE BY MOUTH DAILY   rizatriptan  (MAXALT -MLT) 10 MG disintegrating tablet DISSOLVE ONE TABLET BY MOUTH AS NEEDED, MAY REPEAT IN 2 HOURS IF NEEDED   rosuvastatin  (CRESTOR ) 10 MG tablet Take 1 tablet (10 mg total) by mouth 2 (two) times  a week.   No facility-administered encounter medications on file as of 08/24/2023.    Allergies (verified) Codeine   History: Past Medical History:  Diagnosis Date   Anxiety    Glaucoma    Hypertension    Urinary incontinence    Past Surgical History:  Procedure Laterality Date   COLONOSCOPY WITH PROPOFOL  N/A 10/10/2017   Procedure: COLONOSCOPY WITH PROPOFOL ;  Surgeon: Jinny Carmine, MD;  Location: [facility] ENDOSCOPY;  Service: Endoscopy;  Laterality: N/A;   ESOPHAGOGASTRODUODENOSCOPY (EGD) WITH PROPOFOL  N/A 10/10/2017    Procedure: ESOPHAGOGASTRODUODENOSCOPY (EGD) WITH PROPOFOL ;  Surgeon: Jinny Carmine, MD;  Location: [facility] ENDOSCOPY;  Service: Endoscopy;  Laterality: N/A;   ESOPHAGOGASTRODUODENOSCOPY (EGD) WITH PROPOFOL  N/A 10/06/2020   Procedure: ESOPHAGOGASTRODUODENOSCOPY (EGD) WITH PROPOFOL ;  Surgeon: Jinny Carmine, MD;  Location: [facility] ENDOSCOPY;  Service: Endoscopy;  Laterality: N/A;   HERNIA REPAIR     twice   TONSILLECTOMY     Family History  Problem Relation Age of Onset   ALS Mother    Social History   Socioeconomic History   Marital status: Widowed    Spouse name: Not on file   Number of children: Not on file   Years of education: High School   Highest education level: High school graduate  Occupational History   Occupation: retired  Tobacco Use   Smoking status: Never    Passive exposure: Never   Smokeless tobacco: Never  Vaping Use   Vaping status: Never Used  Substance and Sexual Activity   Alcohol use: Never   Drug use: Never   Sexual activity: Not Currently    Birth control/protection: None  Other Topics Concern   Not on file  Social History Narrative   Not on file   Social Drivers of Health   Financial Resource Strain: Low Risk  (08/24/2023)   Overall Financial Resource Strain (CARDIA)    Difficulty of Paying Living Expenses: Not hard at all  Food Insecurity: No Food Insecurity (08/24/2023)   Hunger Vital Sign    Worried About Running Out of Food in the Last Year: Never true    Ran Out of Food in the Last Year: Never true  Transportation Needs: No Transportation Needs (08/24/2023)   PRAPARE - Administrator, Civil Service (Medical): No    Lack of Transportation (Non-Medical): No  Physical Activity: Insufficiently Active (08/24/2023)   Exercise Vital Sign    Days of Exercise per Week: 5 days    Minutes of Exercise per Session: 20 min  Stress: No Stress Concern Present (08/24/2023)   [employer] of Occupational Health - Occupational Stress Questionnaire     Feeling of Stress: Not at all  Social Connections: Moderately Integrated (08/24/2023)   Social Connection and Isolation Panel    Frequency of Communication with Friends and Family: More than three times a week    Frequency of Social Gatherings with Friends and Family: More than three times a week    Attends Religious Services: More than 4 times per year    Active Member of [employer] or Organizations: Yes    Attends Banker Meetings: More than 4 times per year    Marital Status: Widowed    Tobacco Counseling Counseling given: Not Answered    Clinical Intake:  Pre-visit preparation completed: Yes  Pain : No/denies pain     BMI - recorded: 23.33 Nutritional Status: BMI of 19-24  Normal Nutritional Risks: None Diabetes: No  Lab Results  Component Value Date   HGBA1C 5.7 (  H) 07/28/2022   HGBA1C 5.6 07/22/2021   HGBA1C 5.6 05/22/2020     How often do you need to have someone help you when you read instructions, pamphlets, or other written materials from your doctor or pharmacy?: 1 - Never  Interpreter Needed?: No  Information entered by :: Rojelio Blush LPN   Activities of Daily Living      08/24/2023    1:23 PM  In your present state of health, do you have any difficulty performing the following activities:  Hearing? 0  Vision? 0  Difficulty concentrating or making decisions? 0  Walking or climbing stairs? 0  Dressing or bathing? 0  Doing errands, shopping? 0  Preparing Food and eating ? N  Using the Toilet? N  In the past six months, have you accidently leaked urine? Y  Comment Wears Breifs. Followed by medical attention  Do you have problems with loss of bowel control? N  Managing your Medications? N  Managing your Finances? N  Housekeeping or managing your Housekeeping? N    Patient Care Team: Edman Marsa PARAS, DO as PCP - General (Family Medicine)   I have updated your Care Teams any recent Medical Services you may have received  from other providers in the past year.     Assessment:   This is a routine wellness examination for Yer.  Hearing/Vision screen Hearing Screening - Comments:: Wears Hearing Aids Vision Screening - Comments:: Wears rx glasses - up to date with routine eye exams with  Dr Mevelyn   Goals Addressed               This Visit's Progress     Stay active (pt-stated)         Depression Screen      08/24/2023    1:22 PM 07/25/2023    1:58 PM 04/19/2023    3:04 PM 08/18/2022    1:31 PM 07/28/2022    8:42 AM 07/22/2021    8:43 AM 03/26/2021    8:38 AM  PHQ 2/9 Scores  PHQ - 2 Score 0  0 0 0 0 0  PHQ- 9 Score   0 0   0  Exception Documentation  Patient refusal         Fall Risk      08/24/2023    1:24 PM 04/19/2023    3:04 PM 08/18/2022    1:33 PM 07/28/2022    8:42 AM 07/22/2021   10:01 AM  Fall Risk   Falls in the past year? 0 0 1 1 0  Number falls in past yr: 0  1 1   Injury with Fall? 0  0 0   Risk for fall due to : No Fall Risks  History of fall(s)    Follow up Falls evaluation completed  Falls evaluation completed;Falls prevention discussed      MEDICARE RISK AT HOME:   Medicare Risk at Home Any stairs in or around the home?: Yes If so, are there any without handrails?: No Home free of loose throw rugs in walkways, pet beds, electrical cords, etc?: Yes Adequate lighting in your home to reduce risk of falls?: Yes Life alert?: No Use of a cane, walker or w/c?: No Grab bars in the bathroom?: No Shower chair or bench in shower?: Yes Elevated toilet seat or a handicapped toilet?: Yes  TIMED UP AND GO:  Was the test performed?  No  Cognitive Function: 6CIT completed        08/24/2023  1:24 PM 08/18/2022    1:35 PM 07/22/2021    8:47 AM 06/09/2020    3:21 PM 05/28/2019    2:03 PM  6CIT Screen  What Year? 0 points 0 points 0 points 0 points 0 points  What month? 0 points 0 points 0 points 0 points 0 points  What time? 0 points 3 points 0 points 0 points 0  points  Count back from 20 0 points 0 points 0 points 0 points 2 points  Months in reverse 0 points 0 points 0 points 0 points 0 points  Repeat phrase 0 points 2 points 2 points 8 points 0 points  Total Score 0 points 5 points 2 points 8 points 2 points    Immunizations  There is no immunization history on file for this patient.  Screening Tests Health Maintenance  Topic Date Due   Zoster Vaccines- Shingrix (1 of 2) Never done   COVID-19 Vaccine (1 - 2024-25 season) Never done   Pneumococcal Vaccine: 50+ Years (1 of 2 - PCV) 07/27/2024 (Originally 03/30/1948)   INFLUENZA VACCINE  09/08/2023   Medicare Annual Wellness (AWV)  08/23/2024   Hepatitis B Vaccines  Aged Out   HPV VACCINES  Aged Out   Meningococcal B Vaccine  Aged Out   DTaP/Tdap/Td  Discontinued    Health Maintenance  Health Maintenance Due  Topic Date Due   Zoster Vaccines- Shingrix (1 of 2) Never done   COVID-19 Vaccine (1 - 2024-25 season) Never done   Health Maintenance Items Addressed:   Additional Screening:  Vision Screening: Recommended annual ophthalmology exams for early detection of glaucoma and other disorders of the eye. Would you like a referral to an eye doctor? No    Dental Screening: Recommended annual dental exams for proper oral hygiene  Community Resource Referral / Chronic Care Management: CRR required this visit?  No   CCM required this visit?  No   Plan:    I have personally reviewed and noted the following in the patient's chart:   Medical and social history Use of alcohol, tobacco or illicit drugs  Current medications and supplements including opioid prescriptions. Patient is not currently taking opioid prescriptions. Functional ability and status Nutritional status Physical activity Advanced directives List of other physicians Hospitalizations, surgeries, and ER visits in previous 12 months Vitals Screenings to include cognitive, depression, and falls Referrals and  appointments  In addition, I have reviewed and discussed with patient certain preventive protocols, quality metrics, and best practice recommendations. A written personalized care plan for preventive services as well as general preventive health recommendations were provided to patient.   Rojelio LELON Blush, LPN   2/82/7974   After Visit Summary: (Mail) Due to this being a telephonic visit, the after visit summary with patients personalized plan was offered to patient via mail   Notes: Nothing significant to report at this time.

## 2023-08-24 NOTE — Patient Instructions (Signed)
 Scott Ashley , Thank you for taking time out of your busy schedule to complete your Annual Wellness Visit with me. I enjoyed our conversation and look forward to speaking with you again next year. I, as well as your care team,  appreciate your ongoing commitment to your health goals. Please review the following plan we discussed and let me know if I can assist you in the future. Your Game plan/ To Do List    Referrals: If you haven't heard from the office you've been referred to, please reach out to them at the phone provided.    Follow up Visits: Next Medicare AWV with our clinical staff: 08/30/24 @ 1:20p   Have you seen your provider in the last 6 months (3 months if uncontrolled diabetes)?  Next Office Visit with your provider: 10/18/23 @ 10:20a  Clinician Recommendations:  Aim for 30 minutes of exercise or brisk walking, 6-8 glasses of water, and 5 servings of fruits and vegetables each day.       This is a list of the screening recommended for you and due dates:  Health Maintenance  Topic Date Due   Zoster (Shingles) Vaccine (1 of 2) Never done   COVID-19 Vaccine (1 - 2024-25 season) Never done   Pneumococcal Vaccine for age over 42 (1 of 2 - PCV) 07/27/2024*   Flu Shot  09/08/2023   Medicare Annual Wellness Visit  08/23/2024   Hepatitis B Vaccine  Aged Out   HPV Vaccine  Aged Out   Meningitis B Vaccine  Aged Out   DTaP/Tdap/Td vaccine  Discontinued  *Topic was postponed. The date shown is not the original due date.    Advanced directives: (In Chart) A copy of your advanced directives are scanned into your chart should your provider ever need it. Advance Care Planning is important because it:  [x]  Makes sure you receive the medical care that is consistent with your values, goals, and preferences  [x]  It provides guidance to your family and loved ones and reduces their decisional burden about whether or not they are making the right decisions based on your wishes.  Follow the link  provided in your after visit summary or read over the paperwork we have mailed to you to help you started getting your Advance Directives in place. If you need assistance in completing these, please reach out to us  so that we can help you!  See attachments for Preventive Care and Fall Prevention Tips.

## 2023-08-25 ENCOUNTER — Ambulatory Visit: Admitting: Physician Assistant

## 2023-08-25 MED ORDER — ONDANSETRON 4 MG PO TBDP
4.0000 mg | ORAL_TABLET | Freq: Three times a day (TID) | ORAL | 0 refills | Status: AC | PRN
Start: 2023-08-25 — End: ?

## 2023-08-29 ENCOUNTER — Encounter: Admitting: Family Medicine

## 2023-08-29 DIAGNOSIS — R42 Dizziness and giddiness: Secondary | ICD-10-CM | POA: Diagnosis not present

## 2023-09-03 ENCOUNTER — Other Ambulatory Visit: Payer: Self-pay | Admitting: Family Medicine

## 2023-09-03 DIAGNOSIS — E7800 Pure hypercholesterolemia, unspecified: Secondary | ICD-10-CM

## 2023-09-05 DIAGNOSIS — R42 Dizziness and giddiness: Secondary | ICD-10-CM | POA: Diagnosis not present

## 2023-09-05 NOTE — Telephone Encounter (Signed)
 Discontinued None Edman Marsa PARAS, DO 07/25/23 1441         Requested Prescriptions  Refused Prescriptions Disp Refills   rosuvastatin  (CRESTOR ) 5 MG tablet [Pharmacy Med Name: Rosuvastatin  Calcium  5 MG Oral Tablet] 29 tablet 2    Sig: TAKE 1 TABLET BY MOUTH TWICE  WEEKLY     There is no refill protocol information for this order

## 2023-09-15 ENCOUNTER — Ambulatory Visit: Admitting: Physician Assistant

## 2023-09-15 DIAGNOSIS — Z466 Encounter for fitting and adjustment of urinary device: Secondary | ICD-10-CM | POA: Diagnosis not present

## 2023-09-15 NOTE — Progress Notes (Signed)
 Cath Change/ Replacement  Patient is present today for a catheter change due to urinary retention.  8ml of water was removed from the balloon, a 16FR Silastic foley cath was removed without difficulty.  Patient was cleaned and prepped in a sterile fashion with betadine and 2% lidocaine  jelly was instilled into the urethra. A 14 FR coude foley cath was replaced into the bladder, no complications were noted. Urine return was noted 10ml and urine was yellow in color. The balloon was filled with 10ml of sterile water. A leg bag was attached for drainage.  Patient tolerated well.    Performed by: Justice Aguirre, PA-C and Mathew Pinal, CMA  Follow up: Return in about 4 weeks (around 10/13/2023) for Catheter exchange.

## 2023-09-22 ENCOUNTER — Other Ambulatory Visit: Payer: Self-pay

## 2023-09-22 DIAGNOSIS — E7800 Pure hypercholesterolemia, unspecified: Secondary | ICD-10-CM

## 2023-09-22 MED ORDER — ROSUVASTATIN CALCIUM 10 MG PO TABS: 10.0000 mg | ORAL_TABLET | ORAL | 3 refills | Status: AC

## 2023-09-28 ENCOUNTER — Other Ambulatory Visit: Payer: Self-pay | Admitting: Family Medicine

## 2023-09-28 DIAGNOSIS — I10 Essential (primary) hypertension: Secondary | ICD-10-CM

## 2023-09-29 NOTE — Telephone Encounter (Signed)
 Requested Prescriptions  Pending Prescriptions Disp Refills   amLODipine  (NORVASC ) 5 MG tablet [Pharmacy Med Name: AMLODIPINE  BESYLATE 5MG  TABLETS] 90 tablet 0    Sig: TAKE 1 TABLET(5 MG) BY MOUTH DAILY     Cardiovascular: Calcium  Channel Blockers 2 Passed - 09/29/2023 12:35 PM      Passed - Last BP in normal range    BP Readings from Last 1 Encounters:  08/16/23 133/81         Passed - Last Heart Rate in normal range    Pulse Readings from Last 1 Encounters:  08/16/23 73         Passed - Valid encounter within last 6 months    Recent Outpatient Visits           2 months ago Atrial fibrillation with [facility])   [place] [facility] [place], Marsa PARAS, DO   5 months ago Episodic lightheadedness   [place] [facility] [place], Marsa PARAS, DO       Future Appointments             In 2 weeks Maurine Lukes, PA-C [facility] [place]

## 2023-10-03 DIAGNOSIS — H353131 Nonexudative age-related macular degeneration, bilateral, early dry stage: Secondary | ICD-10-CM | POA: Diagnosis not present

## 2023-10-03 DIAGNOSIS — H2513 Age-related nuclear cataract, bilateral: Secondary | ICD-10-CM | POA: Diagnosis not present

## 2023-10-03 DIAGNOSIS — H401132 Primary open-angle glaucoma, bilateral, moderate stage: Secondary | ICD-10-CM | POA: Diagnosis not present

## 2023-10-11 ENCOUNTER — Other Ambulatory Visit

## 2023-10-16 ENCOUNTER — Ambulatory Visit: Admitting: Physician Assistant

## 2023-10-16 DIAGNOSIS — Z466 Encounter for fitting and adjustment of urinary device: Secondary | ICD-10-CM

## 2023-10-16 NOTE — Progress Notes (Signed)
 Cath Change/ Replacement  Patient is present today for a catheter change due to urinary retention.  8ml of water was removed from the balloon, a 14FR coude foley cath was removed without difficulty.  Patient was cleaned and prepped in a sterile fashion with betadine and 2% lidocaine  jelly was instilled into the urethra. A 16 FR Silastic foley cath was replaced into the bladder, no complications were noted. Urine return was noted 10ml and urine was yellow in color. The balloon was filled with 10ml of sterile water. A leg bag was attached for drainage.  Patient tolerated well.    Performed by: Kerina Simoneau, PA-C   Follow up: Return in about 4 weeks (around 11/13/2023) for Catheter exchange.

## 2023-10-18 ENCOUNTER — Encounter: Admitting: Family Medicine

## 2023-10-18 DIAGNOSIS — E569 Vitamin deficiency, unspecified: Secondary | ICD-10-CM | POA: Diagnosis not present

## 2023-10-18 DIAGNOSIS — R519 Headache, unspecified: Secondary | ICD-10-CM | POA: Diagnosis not present

## 2023-10-18 DIAGNOSIS — I4891 Unspecified atrial fibrillation: Secondary | ICD-10-CM | POA: Diagnosis not present

## 2023-10-18 DIAGNOSIS — G939 Disorder of brain, unspecified: Secondary | ICD-10-CM | POA: Diagnosis not present

## 2023-10-19 ENCOUNTER — Other Ambulatory Visit: Payer: Self-pay | Admitting: Family Medicine

## 2023-10-19 DIAGNOSIS — N138 Other obstructive and reflux uropathy: Secondary | ICD-10-CM

## 2023-10-20 NOTE — Telephone Encounter (Signed)
 Refused Flomax  because it was discontinued 05/25/2023.

## 2023-10-21 ENCOUNTER — Other Ambulatory Visit: Payer: Self-pay | Admitting: Family Medicine

## 2023-10-21 DIAGNOSIS — I4891 Unspecified atrial fibrillation: Secondary | ICD-10-CM

## 2023-10-21 DIAGNOSIS — I10 Essential (primary) hypertension: Secondary | ICD-10-CM

## 2023-10-24 NOTE — Telephone Encounter (Signed)
 Requested by interface surescripts. Protocol met. Requested Prescriptions  Pending Prescriptions Disp Refills   metoprolol  succinate (TOPROL -XL) 50 MG 24 hr tablet [Pharmacy Med Name: METOPROLOL  ER SUCCINATE 50MG  TABS] 30 tablet 2    Sig: TAKE 1 TABLET(50 MG) BY MOUTH DAILY WITH OR IMMEDIATELY FOLLOWING A MEAL     Cardiovascular:  Beta Blockers Passed - 10/24/2023  8:41 AM      Passed - Last BP in normal range    BP Readings from Last 1 Encounters:  08/16/23 133/81         Passed - Last Heart Rate in normal range    Pulse Readings from Last 1 Encounters:  08/16/23 73         Passed - Valid encounter within last 6 months    Recent Outpatient Visits           3 months ago Atrial fibrillation with [facility])   [place] [facility] [place], Marsa PARAS, DO   6 months ago Episodic lightheadedness   [place] [facility] [place], Marsa PARAS, DO       Future Appointments             In 3 weeks Maurine Lukes, PA-C [facility] [place]

## 2023-10-25 DIAGNOSIS — H2512 Age-related nuclear cataract, left eye: Secondary | ICD-10-CM | POA: Diagnosis not present

## 2023-10-25 DIAGNOSIS — Z01818 Encounter for other preprocedural examination: Secondary | ICD-10-CM | POA: Diagnosis not present

## 2023-10-25 DIAGNOSIS — H2513 Age-related nuclear cataract, bilateral: Secondary | ICD-10-CM | POA: Diagnosis not present

## 2023-10-25 DIAGNOSIS — H40013 Open angle with borderline findings, low risk, bilateral: Secondary | ICD-10-CM | POA: Diagnosis not present

## 2023-11-03 ENCOUNTER — Other Ambulatory Visit: Payer: Self-pay | Admitting: Family Medicine

## 2023-11-03 DIAGNOSIS — R42 Dizziness and giddiness: Secondary | ICD-10-CM

## 2023-11-07 NOTE — Telephone Encounter (Signed)
 Requested medication (s) are due for refill today: no  Requested medication (s) are on the active medication list: no  Last refill:  -  Future visit scheduled: -  Notes to clinic:  dc'd 07/25/23 Completed course by Alan Fontana CMA   Requested Prescriptions  Pending Prescriptions Disp Refills   meclizine  (ANTIVERT ) 25 MG tablet [Pharmacy Med Name: MECLIZINE  25MG  RX TABLETS] 90 tablet 1    Sig: TAKE 1 TABLET(25 MG) BY MOUTH THREE TIMES DAILY AS NEEDED FOR DIZZINESS     Not Delegated - Gastroenterology: Antiemetics Failed - 11/07/2023  7:40 AM      Failed - This refill cannot be delegated      Passed - Valid encounter within last 6 months    Recent Outpatient Visits           3 months ago Atrial fibrillation with [facility])   [place] [facility] [place], Marsa PARAS, DO   6 months ago Episodic lightheadedness   [place] [facility] [place], Marsa PARAS, DO       Future Appointments             In 1 week Maurine Lukes, PA-C [facility] [place]

## 2023-11-08 ENCOUNTER — Other Ambulatory Visit: Payer: Self-pay | Admitting: Internal Medicine

## 2023-11-08 DIAGNOSIS — G43909 Migraine, unspecified, not intractable, without status migrainosus: Secondary | ICD-10-CM

## 2023-11-10 ENCOUNTER — Encounter: Payer: Self-pay | Admitting: Family Medicine

## 2023-11-10 DIAGNOSIS — I129 Hypertensive chronic kidney disease with stage 1 through stage 4 chronic kidney disease, or unspecified chronic kidney disease: Secondary | ICD-10-CM | POA: Diagnosis not present

## 2023-11-10 DIAGNOSIS — N189 Chronic kidney disease, unspecified: Secondary | ICD-10-CM | POA: Diagnosis not present

## 2023-11-10 DIAGNOSIS — H2512 Age-related nuclear cataract, left eye: Secondary | ICD-10-CM | POA: Diagnosis not present

## 2023-11-10 DIAGNOSIS — H25812 Combined forms of age-related cataract, left eye: Secondary | ICD-10-CM | POA: Diagnosis not present

## 2023-11-10 NOTE — Telephone Encounter (Signed)
 Requested Prescriptions  Pending Prescriptions Disp Refills   rizatriptan  (MAXALT -MLT) 10 MG disintegrating tablet [Pharmacy Med Name: RIZATRIPTAN  ODT 10MG  TABLETS] 10 tablet 0    Sig: DISSOLVE 1 TABLET BY MOUTH AS NEEDED, MAY REPEAT IN 2 HOURS IF NEEDED     Neurology:  Migraine Therapy - Triptan Passed - 11/10/2023 10:10 AM      Passed - Last BP in normal range    BP Readings from Last 1 Encounters:  08/16/23 133/81         Passed - Valid encounter within last 12 months    Recent Outpatient Visits           3 months ago Atrial fibrillation with [facility])   [place] [facility] [place], Marsa PARAS, DO   6 months ago Episodic lightheadedness   [place] [facility] [place], Marsa PARAS, DO       Future Appointments             In 5 days Maurine Lukes, PA-C [facility] [place]

## 2023-11-15 ENCOUNTER — Ambulatory Visit: Admitting: Physician Assistant

## 2023-11-15 ENCOUNTER — Encounter: Payer: Self-pay | Admitting: Physician Assistant

## 2023-11-15 VITALS — BP 151/74 | HR 82 | Ht 71.0 in | Wt 168.0 lb

## 2023-11-15 DIAGNOSIS — Z466 Encounter for fitting and adjustment of urinary device: Secondary | ICD-10-CM | POA: Diagnosis not present

## 2023-11-15 NOTE — Progress Notes (Signed)
 Cath Change/ Replacement  Patient is present today for a catheter change due to urinary retention.  8ml of water was removed from the balloon, a 16FR Silastic foley cath was removed without difficulty.  Patient was cleaned and prepped in a sterile fashion with betadine and 2% lidocaine  jelly was instilled into the urethra. A 16 FR Silastic foley cath was replaced into the bladder, no complications were noted. Urine return was not noted but catheter was hubbed. The balloon was filled with 10ml of sterile water. A leg bag was attached for drainage.  Patient tolerated well.    Performed by: Elo Marmolejos, PA-C   Follow up: Return in about 4 weeks (around 12/13/2023) for Catheter exchange.

## 2023-11-16 DIAGNOSIS — E569 Vitamin deficiency, unspecified: Secondary | ICD-10-CM | POA: Diagnosis not present

## 2023-11-16 DIAGNOSIS — I4891 Unspecified atrial fibrillation: Secondary | ICD-10-CM | POA: Diagnosis not present

## 2023-11-16 DIAGNOSIS — R519 Headache, unspecified: Secondary | ICD-10-CM | POA: Diagnosis not present

## 2023-11-21 ENCOUNTER — Encounter: Admitting: Family Medicine

## 2023-12-05 ENCOUNTER — Other Ambulatory Visit: Payer: Self-pay | Admitting: Family Medicine

## 2023-12-05 DIAGNOSIS — F5101 Primary insomnia: Secondary | ICD-10-CM

## 2023-12-07 NOTE — Telephone Encounter (Signed)
 Requested medication (s) are due for refill today: yes  Requested medication (s) are on the active medication list: yes  Last refill:  05/23/23  Future visit scheduled: yes  Notes to clinic:   Medication not assigned to a protocol, review manually.      Requested Prescriptions  Pending Prescriptions Disp Refills   BELSOMRA  20 MG TABS [Pharmacy Med Name: BELSOMRA  20MG  TABLETS] 30 tablet     Sig: TAKE 1/2 TO 1 TABLET(10 TO 20 MG) BY MOUTH AT BEDTIME AS NEEDED     Off-Protocol Failed - 12/07/2023 12:34 PM      Failed - Medication not assigned to a protocol, review manually.      Passed - Valid encounter within last 12 months    Recent Outpatient Visits           4 months ago Atrial fibrillation with RVR [facility])   [place] [facility] [place], Marsa PARAS, DO   7 months ago Episodic lightheadedness   [place] [facility] [place], Marsa PARAS, DO       Future Appointments             In 5 days Maurine Lukes, PA-C [facility]

## 2023-12-12 ENCOUNTER — Ambulatory Visit: Admitting: Physician Assistant

## 2023-12-12 VITALS — BP 137/86 | HR 99

## 2023-12-12 DIAGNOSIS — Z466 Encounter for fitting and adjustment of urinary device: Secondary | ICD-10-CM

## 2023-12-12 NOTE — Progress Notes (Signed)
 Cath Change/ Replacement  Patient is present today for a catheter change due to urinary retention.  8ml of water was removed from the balloon, a 16FR Silastic foley cath was removed without difficulty.  Patient was cleaned and prepped in a sterile fashion with betadine and 2% lidocaine  jelly was instilled into the urethra. A 16 FR Silastic foley cath was replaced into the bladder, no complications were noted. Urine return was noted 10ml and urine was yellow in color. The balloon was filled with 10ml of sterile water. A leg bag was attached for drainage.  Patient tolerated well.    Performed by: Lucie Hones, PA-C   Follow up: Return in about 4 weeks (around 01/09/2024) for Catheter exchange.

## 2023-12-16 ENCOUNTER — Other Ambulatory Visit: Payer: Self-pay

## 2023-12-16 DIAGNOSIS — Z79899 Other long term (current) drug therapy: Secondary | ICD-10-CM | POA: Diagnosis not present

## 2023-12-16 DIAGNOSIS — Y828 Other medical devices associated with adverse incidents: Secondary | ICD-10-CM | POA: Diagnosis not present

## 2023-12-16 DIAGNOSIS — I10 Essential (primary) hypertension: Secondary | ICD-10-CM | POA: Diagnosis not present

## 2023-12-16 DIAGNOSIS — T83098A Other mechanical complication of other indwelling urethral catheter, initial encounter: Secondary | ICD-10-CM | POA: Insufficient documentation

## 2023-12-16 DIAGNOSIS — N390 Urinary tract infection, site not specified: Secondary | ICD-10-CM | POA: Insufficient documentation

## 2023-12-16 NOTE — ED Triage Notes (Addendum)
 Pt arrives via POV in personal wheelchair BIB Granddaughter/Caregiver Paige. Pt has a chronic urinary catheter that was last changed on Tuesday at [facility]rology Clinic. Pt sts concern for urinary retention. Last emptied at 7 pm - with little to no output since. Sts he feels lower abd pressure. No sediment or blood clots noted by pt /caregiver.

## 2023-12-17 ENCOUNTER — Emergency Department
Admission: EM | Admit: 2023-12-17 | Discharge: 2023-12-17 | Disposition: A | Discharge status: Home / Self Care | Attending: Emergency Medicine | Admitting: Emergency Medicine

## 2023-12-17 DIAGNOSIS — T83091A Other mechanical complication of indwelling urethral catheter, initial encounter: Secondary | ICD-10-CM

## 2023-12-17 DIAGNOSIS — N390 Urinary tract infection, site not specified: Secondary | ICD-10-CM

## 2023-12-17 LAB — URINALYSIS, ROUTINE W REFLEX MICROSCOPIC
Bilirubin Urine: NEGATIVE
Glucose, UA: NEGATIVE mg/dL
Hgb urine dipstick: NEGATIVE
Ketones, ur: NEGATIVE mg/dL
Nitrite: POSITIVE — AB
Protein, ur: NEGATIVE mg/dL
RBC / HPF: >50 RBC/hpf (ref 0–5)
Specific Gravity, Urine: 1.017 (ref 1.005–1.030)
Squamous Epithelial / HPF: 0 /HPF (ref 0–5)
pH: 8 (ref 5.0–8.0)

## 2023-12-17 LAB — BASIC METABOLIC PANEL WITH GFR
Anion gap: 12 (ref 5–15)
BUN: 28 mg/dL — ABNORMAL HIGH (ref 8–23)
CO2: 27 mmol/L (ref 22–32)
Calcium: 9.8 mg/dL (ref 8.9–10.3)
Chloride: 102 mmol/L (ref 98–111)
Creatinine, Ser: 0.96 mg/dL (ref 0.61–1.24)
GFR, Estimated: >60 mL/min (ref >60)
Glucose, Bld: 102 mg/dL — ABNORMAL HIGH (ref 70–99)
Potassium: 4.4 mmol/L (ref 3.5–5.1)
Sodium: 141 mmol/L (ref 135–145)

## 2023-12-17 LAB — CBC
HCT: 44 % (ref 39.0–52.0)
Hemoglobin: 14.5 g/dL (ref 13.0–17.0)
MCH: 29 pg (ref 26.0–34.0)
MCHC: 33 g/dL (ref 30.0–36.0)
MCV: 88 fL (ref 80.0–100.0)
Platelets: 118 K/uL — ABNORMAL LOW (ref 150–400)
RBC: 5 MIL/uL (ref 4.22–5.81)
RDW: 13.1 % (ref 11.5–15.5)
WBC: 6.5 K/uL (ref 4.0–10.5)
nRBC: 0 % (ref 0.0–0.2)

## 2023-12-17 MED ORDER — CEPHALEXIN 500 MG PO CAPS
500.0000 mg | ORAL_CAPSULE | Freq: Once | ORAL | Status: AC
  Administered 2023-12-17: 500 mg via ORAL
  Filled 2023-12-17: qty 1

## 2023-12-17 MED ORDER — CEPHALEXIN 500 MG PO CAPS: 500.0000 mg | ORAL_CAPSULE | Freq: Two times a day (BID) | ORAL | 0 refills | Status: DC

## 2023-12-17 NOTE — ED Provider Notes (Signed)
 [facility] Provider Note    Event Date/Time   First MD Initiated Contact with Patient 12/17/23 0236     (approximate)   History   Urinary Retention (Urinary Cath)   HPI  Scott Ashley is a 89 y.o. male with history of hypertension, chronic indwelling Foley catheter who presents to the emergency department with complaints that his catheter is not draining he is starting to feel discomfort in the suprapubic area.  No fevers, vomiting, diarrhea.  Catheter just exchanged on Tuesday, 12/12/2023 by urology.   History provided by patient, family.    Past Medical History:  Diagnosis Date   Anxiety    Glaucoma    Hypertension    Urinary incontinence     Past Surgical History:  Procedure Laterality Date   COLONOSCOPY WITH PROPOFOL  N/A 10/10/2017   Procedure: COLONOSCOPY WITH PROPOFOL ;  Surgeon: Jinny Carmine, MD;  Location: [facility] ENDOSCOPY;  Service: Endoscopy;  Laterality: N/A;   ESOPHAGOGASTRODUODENOSCOPY (EGD) WITH PROPOFOL  N/A 10/10/2017   Procedure: ESOPHAGOGASTRODUODENOSCOPY (EGD) WITH PROPOFOL ;  Surgeon: Jinny Carmine, MD;  Location: [facility] ENDOSCOPY;  Service: Endoscopy;  Laterality: N/A;   ESOPHAGOGASTRODUODENOSCOPY (EGD) WITH PROPOFOL  N/A 10/06/2020   Procedure: ESOPHAGOGASTRODUODENOSCOPY (EGD) WITH PROPOFOL ;  Surgeon: Jinny Carmine, MD;  Location: [facility] ENDOSCOPY;  Service: Endoscopy;  Laterality: N/A;   HERNIA REPAIR     twice   TONSILLECTOMY      MEDICATIONS:  Prior to Admission medications   Medication Sig Start Date End Date Taking? Authorizing Provider  amLODipine  (NORVASC ) 5 MG tablet TAKE 1 TABLET(5 MG) BY MOUTH DAILY 09/29/23   [place], Marsa PARAS, DO  BELSOMRA  20 MG TABS TAKE 1/2 TO 1 TABLET(10 TO 20 MG) BY MOUTH AT BEDTIME AS NEEDED 12/07/23   Karamalegos, Marsa PARAS, DO  finasteride  (PROSCAR ) 5 MG tablet TAKE 1 TABLET(5 MG) BY MOUTH DAILY 01/16/23   Vaillancourt, Lucie, PA-C  latanoprost  (XALATAN ) 0.005 % ophthalmic solution Place 1  drop into both eyes at bedtime. 05/01/17   [provider]  meclizine  (ANTIVERT ) 25 MG tablet TAKE 1 TABLET(25 MG) BY MOUTH THREE TIMES DAILY AS NEEDED FOR DIZZINESS 11/07/23   [place], Marsa PARAS, DO  metoprolol  succinate (TOPROL -XL) 50 MG 24 hr tablet TAKE 1 TABLET(50 MG) BY MOUTH DAILY WITH OR IMMEDIATELY FOLLOWING A MEAL 10/24/23   Karamalegos, Marsa PARAS, DO  Multiple Vitamin (MULTIVITAMIN WITH MINERALS) TABS tablet Take 1 tablet by mouth daily. 01/21/22   Alexander, Natalie, DO  ofloxacin (OCUFLOX) 0.3 % ophthalmic solution  11/07/23   [provider]  omeprazole  (PRILOSEC) 20 MG capsule TAKE 1 CAPSULE BY MOUTH DAILY 10/20/23   [place], Marsa PARAS, DO  ondansetron  (ZOFRAN -ODT) 4 MG disintegrating tablet Take 1 tablet (4 mg total) by mouth every 8 (eight) hours as needed for nausea or vomiting. 08/25/23   Antonette Angeline ORN, NP  prednisoLONE acetate (PRED FORTE) 1 % ophthalmic suspension SMARTSIG:In Eye(s) 11/07/23   [provider]  predniSONE (STERAPRED UNI-PAK 21 TAB) 10 MG (21) TBPK tablet Take by mouth as directed. 08/07/23   [provider]  rizatriptan  (MAXALT -MLT) 10 MG disintegrating tablet DISSOLVE 1 TABLET BY MOUTH AS NEEDED, MAY REPEAT IN 2 HOURS IF NEEDED 11/10/23   Karamalegos, Marsa PARAS, DO  rosuvastatin  (CRESTOR ) 10 MG tablet Take 1 tablet (10 mg total) by mouth 2 (two) times a week. 09/25/23   [place] Marsa PARAS, DO    Physical Exam   Triage Vital Signs: ED Triage Vitals  Encounter Vitals Group  BP 12/16/23 2351 (!) 157/96     Girls Systolic BP Percentile --      Girls Diastolic BP Percentile --      Boys Systolic BP Percentile --      Boys Diastolic BP Percentile --      Pulse Rate 12/16/23 2351 81     Resp 12/16/23 2351 16     Temp 12/16/23 2351 (!) 97.4 F (36.3 C)     Temp Source 12/16/23 2351 Oral     SpO2 12/16/23 2351 99 %     Weight --      Height --      Head Circumference --      Peak Flow --      Pain Score  12/17/23 0238 5     Pain Loc --      Pain Education --      Exclude from Growth Chart --     Most recent vital signs: Vitals:   12/17/23 0238 12/17/23 0410  BP: (!) 170/93 (!) 127/102  Pulse: (!) 54 86  Resp: 20 20  Temp:  97.6 F (36.4 C)  SpO2: 99% 98%    CONSTITUTIONAL: Alert, responds appropriately to questions. Well-appearing; well-nourished, elderly HEAD: Normocephalic, atraumatic EYES: Conjunctivae clear, pupils appear equal, sclera nonicteric ENT: normal nose; moist mucous membranes NECK: Supple, normal ROM CARD: RRR; S1 and S2 appreciated RESP: Normal chest excursion without splinting or tachypnea; breath sounds clear and equal bilaterally; no wheezes, no rhonchi, no rales, no hypoxia or respiratory distress, speaking full sentences ABD/GI: Non-distended; soft, non-tender, no rebound, no guarding, no peritoneal signs BACK: The back appears normal EXT: Normal ROM in all joints; no deformity noted, no edema SKIN: Normal color for age and race; warm; no rash on exposed skin NEURO: Moves all extremities equally, normal speech PSYCH: The patient's mood and manner are appropriate.   ED Results / Procedures / Treatments   LABS: (all labs ordered are listed, but only abnormal results are displayed) Labs Reviewed  URINALYSIS, ROUTINE W REFLEX MICROSCOPIC - Abnormal; Notable for the following components:      Result Value   Color, Urine AMBER (*)    APPearance CLOUDY (*)    Nitrite POSITIVE (*)    Leukocytes,Ua SMALL (*)    Bacteria, UA MANY (*)    All other components within normal limits  CBC - Abnormal; Notable for the following components:   Platelets 118 (*)    All other components within normal limits  BASIC METABOLIC PANEL WITH GFR - Abnormal; Notable for the following components:   Glucose, Bld 102 (*)    BUN 28 (*)    All other components within normal limits  URINE CULTURE     EKG:  RADIOLOGY: My personal review and interpretation of imaging:    I  have personally reviewed all radiology reports.   No results found.   PROCEDURES:  Critical Care performed: No    Procedures    IMPRESSION / MDM / ASSESSMENT AND PLAN / ED COURSE  I reviewed the triage vital signs and the nursing notes.    Patient here with issues with his Foley catheter not draining.  No significant urinary retention but we are unable to flush the catheter successfully so we will need to be replaced.  The patient is on the cardiac monitor to evaluate for evidence of arrhythmia and/or significant heart rate changes.   DIFFERENTIAL DIAGNOSIS (includes but not limited to):   Obstructed catheter, UTI, low suspicion for  postobstructive renal failure, doubt pyelonephritis or sepsis   Patient's presentation is most consistent with acute presentation with potential threat to life or bodily function.   PLAN: Labs obtained from triage show no leukocytosis.  Normal electrolytes and creatinine.  Catheter exchanged and now urine is flowing freely and he has no complaints and his abdominal exam is completely benign.  Urinalysis pending.  Urine in the Foley catheter bag now is very cloudy with sediment but no blood.   MEDICATIONS GIVEN IN ED: Medications  cephALEXin  (KEFLEX ) capsule 500 mg (500 mg Oral Given 12/17/23 0408)     ED COURSE: Urine is positive for nitrites as well as white blood cells, red blood cells and many bacteria.  Culture is pending.  Previous urine culture in February 2025 grew pansensitive E. coli.  Will start him on Keflex .   At this time, I do not feel there is any life-threatening condition present. I reviewed all nursing notes, vitals, pertinent previous records.  All lab and urine results, EKGs, imaging ordered have been independently reviewed and interpreted by myself.  I reviewed all available radiology reports from any imaging ordered this visit.  Based on my assessment, I feel the patient is safe to be discharged home without further emergent  workup and can continue workup as an outpatient as needed. Discussed all findings, treatment plan as well as usual and customary return precautions.  They verbalize understanding and are comfortable with this plan.  Outpatient follow-up has been provided as needed.  All questions have been answered.    CONSULTS:  none   OUTSIDE RECORDS REVIEWED: Reviewed recent urology notes.       FINAL CLINICAL IMPRESSION(S) / ED DIAGNOSES   Final diagnoses:  Obstruction of Foley catheter, initial encounter  Acute UTI     Rx / DC Orders   ED Discharge Orders          Ordered    cephALEXin  (KEFLEX ) 500 MG capsule  2 times daily        12/17/23 0400             Note:  This document was prepared using Dragon voice recognition software and may include unintentional dictation errors.   Mcdonald Reiling, Josette SAILOR, DO 12/17/23 [phone]

## 2023-12-17 NOTE — ED Notes (Signed)
 Bladder scan reported urine in bladder.

## 2023-12-17 NOTE — Discharge Instructions (Addendum)
 Your urine does appear to be infected today so we are starting Keflex  twice a day for the next week.

## 2023-12-18 LAB — URINE CULTURE

## 2024-01-02 ENCOUNTER — Other Ambulatory Visit: Payer: Self-pay | Admitting: Physician Assistant

## 2024-01-03 NOTE — Telephone Encounter (Signed)
 1st phone call attempted to [facility] Carlyon Moats. No answer voicemail left for her to return call to clinic and provide contact information on home health agency for verbal order to be given.

## 2024-01-16 ENCOUNTER — Ambulatory Visit: Admitting: Physician Assistant

## 2024-01-16 ENCOUNTER — Encounter: Payer: Self-pay | Admitting: Physician Assistant

## 2024-01-16 VITALS — BP 129/79 | HR 92 | Ht 71.0 in | Wt 169.0 lb

## 2024-01-16 DIAGNOSIS — Z466 Encounter for fitting and adjustment of urinary device: Secondary | ICD-10-CM

## 2024-01-16 NOTE — Progress Notes (Signed)
 Cath Change/ Replacement  Patient is present today for a catheter change due to urinary retention.  8ml of water was removed from the balloon, a 16FR foley cath was removed without difficulty.  Patient was cleaned and prepped in a sterile fashion with betadine and 2% lidocaine  jelly was instilled into the urethra. A 16 FR coude foley cath was replaced into the bladder, no complications were noted. Urine return was noted 10ml and urine was yellow in color. The balloon was filled with 10ml of sterile water. A leg bag was attached for drainage.  Patient tolerated well.    Performed by: Toyoko Silos, PA-C   Follow up: Return in about 4 weeks (around 02/13/2024) for Catheter exchange.

## 2024-01-22 ENCOUNTER — Other Ambulatory Visit: Payer: Self-pay | Admitting: Family Medicine

## 2024-01-22 DIAGNOSIS — I4891 Unspecified atrial fibrillation: Secondary | ICD-10-CM

## 2024-01-22 DIAGNOSIS — I10 Essential (primary) hypertension: Secondary | ICD-10-CM

## 2024-01-23 ENCOUNTER — Other Ambulatory Visit: Payer: Self-pay

## 2024-01-23 ENCOUNTER — Ambulatory Visit

## 2024-01-23 DIAGNOSIS — R3989 Other symptoms and signs involving the genitourinary system: Secondary | ICD-10-CM

## 2024-01-23 DIAGNOSIS — N401 Enlarged prostate with lower urinary tract symptoms: Secondary | ICD-10-CM

## 2024-01-23 DIAGNOSIS — R339 Retention of urine, unspecified: Secondary | ICD-10-CM

## 2024-01-23 DIAGNOSIS — N138 Other obstructive and reflux uropathy: Secondary | ICD-10-CM

## 2024-01-23 LAB — URINALYSIS, COMPLETE
Bilirubin, UA: NEGATIVE
Glucose, UA: NEGATIVE
Ketones, UA: NEGATIVE
Nitrite, UA: POSITIVE — AB
Specific Gravity, UA: 1.03 (ref 1.005–1.030)
Urobilinogen, Ur: 0.2 mg/dL (ref 0.2–1.0)
pH, UA: 6 (ref 5.0–7.5)

## 2024-01-23 LAB — MICROSCOPIC EXAMINATION
RBC, Urine: >30 /HPF — AB (ref 0–2)
WBC, UA: >30 /HPF — AB (ref 0–5)

## 2024-01-23 LAB — BLADDER SCAN AMB NON-IMAGING: Scan Result: 19

## 2024-01-23 MED ORDER — CEFDINIR 300 MG PO CAPS: 300.0000 mg | ORAL_CAPSULE | Freq: Two times a day (BID) | ORAL | 0 refills | Status: AC

## 2024-01-23 NOTE — Progress Notes (Addendum)
 Cath Change/ Replacement  Patient is present today for a catheter change due to urinary retention and discomfort of catheter.  10 ml of water was removed from the balloon, a 16 FR coude foley cath was removed without difficulty.  Patient was cleaned and prepped in a sterile fashion with betadine and 2% lidocaine  jelly was instilled into the urethra. A 16 FR coude foley cath was replaced into the bladder, no complications were noted. Urine return was noted 20 ml and urine was yellow in color. A sterile urine specimen was obtained for UA and culture due to patients discomfort. The balloon was filled with 10ml of sterile water. A leg bag was attached for drainage.    Performed by: Mathew Pinal, RN  Additional notes: He reports about a week of increased urinary urgency and bladder discomfort.  Bladder scan was WNL on arrival and Foley catheter was exchanged as above.  UA appeared grossly infected, will start empiric Omnicef  and send for culture for further evaluation.  Will treat for presumed cystitis. Lucie Hones, PA-C   Follow up: Will call with results

## 2024-01-24 ENCOUNTER — Ambulatory Visit

## 2024-01-24 NOTE — Telephone Encounter (Signed)
 Requested Prescriptions  Pending Prescriptions Disp Refills   metoprolol  succinate (TOPROL -XL) 50 MG 24 hr tablet [Pharmacy Med Name: METOPROLOL  ER SUCCINATE 50MG  TABS] 90 tablet 0    Sig: TAKE 1 TABLET(50 MG) BY MOUTH DAILY WITH OR IMMEDIATELY FOLLOWING A MEAL     Cardiovascular:  Beta Blockers Passed - 01/24/2024  3:46 PM      Passed - Last BP in normal range    BP Readings from Last 1 Encounters:  01/16/24 129/79         Passed - Last Heart Rate in normal range    Pulse Readings from Last 1 Encounters:  01/16/24 92         Passed - Valid encounter within last 6 months    Recent Outpatient Visits           6 months ago Atrial fibrillation with RVR [facility])   [place] [facility] [place] Marsa PARAS, DO   9 months ago Episodic lightheadedness   [facility] [place], Marsa PARAS, DO       Future Appointments             In 3 weeks Maurine Lukes, PA-C [facility]

## 2024-01-27 LAB — CULTURE, URINE COMPREHENSIVE

## 2024-01-28 ENCOUNTER — Ambulatory Visit: Payer: Self-pay | Admitting: Urology

## 2024-02-15 ENCOUNTER — Ambulatory Visit: Admitting: Physician Assistant

## 2024-02-15 DIAGNOSIS — Z466 Encounter for fitting and adjustment of urinary device: Secondary | ICD-10-CM

## 2024-02-15 NOTE — Progress Notes (Signed)
 Cath Change/ Replacement  Patient is present today for a catheter change due to urinary retention.  8ml of water was removed from the balloon, a 16FR coude foley cath was removed without difficulty.  Patient was cleaned and prepped in a sterile fashion with betadine and 2% lidocaine  jelly was instilled into the urethra. A 16 FR coude foley cath was replaced into the bladder, no complications were noted. Urine return was not noted but catheter was hubbed. The balloon was filled with 10ml of sterile water. A leg bag was attached for drainage.  Patient tolerated well.    Performed by: Tamella Tuccillo, PA-C   Follow up: Return in about 4 weeks (around 03/14/2024) for Catheter exchange.

## 2024-03-18 ENCOUNTER — Ambulatory Visit: Admitting: Physician Assistant

## 2024-04-15 ENCOUNTER — Ambulatory Visit: Admitting: Physician Assistant

## 2024-04-17 ENCOUNTER — Ambulatory Visit: Admitting: Physician Assistant

## 2024-08-30 ENCOUNTER — Ambulatory Visit

## 2024-09-04 ENCOUNTER — Ambulatory Visit
